# Patient Record
Sex: Female | Born: 1935
Health system: Southern US, Community
[De-identification: ages and names within clinical notes are randomized; demographics above are authoritative.]

## PROBLEM LIST (undated history)

## (undated) DIAGNOSIS — J069 Acute upper respiratory infection, unspecified: Secondary | ICD-10-CM

## (undated) DIAGNOSIS — J189 Pneumonia, unspecified organism: Secondary | ICD-10-CM

## (undated) DIAGNOSIS — C801 Malignant (primary) neoplasm, unspecified: Secondary | ICD-10-CM

## (undated) DIAGNOSIS — I10 Essential (primary) hypertension: Secondary | ICD-10-CM

## (undated) DIAGNOSIS — Z8489 Family history of other specified conditions: Secondary | ICD-10-CM

## (undated) DIAGNOSIS — R112 Nausea with vomiting, unspecified: Secondary | ICD-10-CM

## (undated) DIAGNOSIS — R011 Cardiac murmur, unspecified: Secondary | ICD-10-CM

## (undated) DIAGNOSIS — K219 Gastro-esophageal reflux disease without esophagitis: Secondary | ICD-10-CM

## (undated) DIAGNOSIS — M1990 Unspecified osteoarthritis, unspecified site: Secondary | ICD-10-CM

## (undated) DIAGNOSIS — C3490 Malignant neoplasm of unspecified part of unspecified bronchus or lung: Secondary | ICD-10-CM

## (undated) DIAGNOSIS — Z9889 Other specified postprocedural states: Secondary | ICD-10-CM

## (undated) DIAGNOSIS — I739 Peripheral vascular disease, unspecified: Secondary | ICD-10-CM

## (undated) DIAGNOSIS — R0600 Dyspnea, unspecified: Secondary | ICD-10-CM

## (undated) HISTORY — PX: DILATION AND CURETTAGE OF UTERUS: SHX78

## (undated) HISTORY — PX: MASTECTOMY: SHX3

## (undated) HISTORY — PX: LOBECTOMY: SHX5089

## (undated) HISTORY — PX: EYE SURGERY: SHX253

## (undated) HISTORY — PX: OTHER SURGICAL HISTORY: SHX169

## (undated) HISTORY — PX: BREAST BIOPSY: SHX20

## (undated) HISTORY — PX: ROTATOR CUFF REPAIR: SHX139

---

## 1999-09-15 ENCOUNTER — Encounter: Admission: RE | Admit: 1999-09-15 | Discharge: 1999-09-15 | Payer: Self-pay | Admitting: General Surgery

## 1999-09-15 ENCOUNTER — Encounter: Payer: Self-pay | Admitting: General Surgery

## 2000-09-17 ENCOUNTER — Encounter: Admission: RE | Admit: 2000-09-17 | Discharge: 2000-09-17 | Payer: Self-pay | Admitting: General Surgery

## 2000-09-17 ENCOUNTER — Encounter: Payer: Self-pay | Admitting: General Surgery

## 2001-03-31 ENCOUNTER — Emergency Department (HOSPITAL_COMMUNITY): Admission: EM | Admit: 2001-03-31 | Discharge: 2001-03-31 | Payer: Self-pay | Admitting: Emergency Medicine

## 2001-09-19 ENCOUNTER — Encounter: Payer: Self-pay | Admitting: Obstetrics and Gynecology

## 2001-09-19 ENCOUNTER — Encounter: Admission: RE | Admit: 2001-09-19 | Discharge: 2001-09-19 | Payer: Self-pay | Admitting: Obstetrics and Gynecology

## 2002-03-31 ENCOUNTER — Encounter: Payer: Self-pay | Admitting: General Surgery

## 2002-03-31 ENCOUNTER — Encounter: Admission: RE | Admit: 2002-03-31 | Discharge: 2002-03-31 | Payer: Self-pay | Admitting: General Surgery

## 2002-06-04 ENCOUNTER — Encounter: Admission: RE | Admit: 2002-06-04 | Discharge: 2002-06-04 | Payer: Self-pay | Admitting: Internal Medicine

## 2002-06-04 ENCOUNTER — Encounter: Payer: Self-pay | Admitting: Internal Medicine

## 2002-08-11 ENCOUNTER — Encounter: Payer: Self-pay | Admitting: General Surgery

## 2002-08-11 ENCOUNTER — Encounter: Admission: RE | Admit: 2002-08-11 | Discharge: 2002-08-11 | Payer: Self-pay | Admitting: General Surgery

## 2002-09-11 ENCOUNTER — Ambulatory Visit (HOSPITAL_COMMUNITY): Admission: RE | Admit: 2002-09-11 | Discharge: 2002-09-11 | Payer: Self-pay | Admitting: Gastroenterology

## 2002-10-08 ENCOUNTER — Encounter: Admission: RE | Admit: 2002-10-08 | Discharge: 2002-10-08 | Payer: Self-pay | Admitting: General Surgery

## 2002-10-08 ENCOUNTER — Encounter: Payer: Self-pay | Admitting: General Surgery

## 2002-10-27 ENCOUNTER — Ambulatory Visit (HOSPITAL_COMMUNITY): Admission: RE | Admit: 2002-10-27 | Discharge: 2002-10-27 | Payer: Self-pay | Admitting: General Surgery

## 2002-10-27 ENCOUNTER — Encounter: Payer: Self-pay | Admitting: General Surgery

## 2002-12-03 ENCOUNTER — Encounter: Admission: RE | Admit: 2002-12-03 | Discharge: 2002-12-03 | Payer: Self-pay | Admitting: General Surgery

## 2002-12-03 ENCOUNTER — Encounter: Payer: Self-pay | Admitting: General Surgery

## 2003-01-26 ENCOUNTER — Encounter: Admission: RE | Admit: 2003-01-26 | Discharge: 2003-01-26 | Payer: Self-pay | Admitting: Obstetrics and Gynecology

## 2003-01-26 ENCOUNTER — Encounter: Payer: Self-pay | Admitting: Obstetrics and Gynecology

## 2003-02-04 ENCOUNTER — Ambulatory Visit (HOSPITAL_COMMUNITY): Admission: RE | Admit: 2003-02-04 | Discharge: 2003-02-04 | Payer: Self-pay | Admitting: General Surgery

## 2003-02-04 ENCOUNTER — Encounter: Payer: Self-pay | Admitting: General Surgery

## 2003-02-13 ENCOUNTER — Encounter: Payer: Self-pay | Admitting: General Surgery

## 2003-02-13 ENCOUNTER — Encounter: Admission: RE | Admit: 2003-02-13 | Discharge: 2003-02-13 | Payer: Self-pay | Admitting: General Surgery

## 2003-02-25 ENCOUNTER — Encounter: Payer: Self-pay | Admitting: Thoracic Surgery

## 2003-02-25 ENCOUNTER — Inpatient Hospital Stay (HOSPITAL_COMMUNITY): Admission: RE | Admit: 2003-02-25 | Discharge: 2003-03-07 | Payer: Self-pay | Admitting: Thoracic Surgery

## 2003-02-25 ENCOUNTER — Encounter (INDEPENDENT_AMBULATORY_CARE_PROVIDER_SITE_OTHER): Payer: Self-pay | Admitting: *Deleted

## 2003-02-26 ENCOUNTER — Encounter: Payer: Self-pay | Admitting: Thoracic Surgery

## 2003-02-27 ENCOUNTER — Encounter: Payer: Self-pay | Admitting: Thoracic Surgery

## 2003-02-28 ENCOUNTER — Encounter: Payer: Self-pay | Admitting: Thoracic Surgery

## 2003-03-01 ENCOUNTER — Encounter: Payer: Self-pay | Admitting: Thoracic Surgery

## 2003-03-02 ENCOUNTER — Encounter: Payer: Self-pay | Admitting: Thoracic Surgery

## 2003-03-03 ENCOUNTER — Encounter: Payer: Self-pay | Admitting: Thoracic Surgery

## 2003-03-04 ENCOUNTER — Encounter: Payer: Self-pay | Admitting: Thoracic Surgery

## 2003-03-05 ENCOUNTER — Encounter: Payer: Self-pay | Admitting: Thoracic Surgery

## 2003-03-06 ENCOUNTER — Encounter: Payer: Self-pay | Admitting: Thoracic Surgery

## 2003-03-07 ENCOUNTER — Encounter: Payer: Self-pay | Admitting: Thoracic Surgery

## 2003-03-13 ENCOUNTER — Encounter: Admission: RE | Admit: 2003-03-13 | Discharge: 2003-03-13 | Payer: Self-pay | Admitting: Thoracic Surgery

## 2003-03-13 ENCOUNTER — Encounter: Payer: Self-pay | Admitting: Thoracic Surgery

## 2003-03-31 ENCOUNTER — Encounter: Payer: Self-pay | Admitting: Thoracic Surgery

## 2003-03-31 ENCOUNTER — Encounter: Admission: RE | Admit: 2003-03-31 | Discharge: 2003-03-31 | Payer: Self-pay | Admitting: Thoracic Surgery

## 2003-04-13 ENCOUNTER — Encounter: Payer: Self-pay | Admitting: Thoracic Surgery

## 2003-04-13 ENCOUNTER — Ambulatory Visit (HOSPITAL_COMMUNITY): Admission: RE | Admit: 2003-04-13 | Discharge: 2003-04-13 | Payer: Self-pay | Admitting: Thoracic Surgery

## 2003-05-29 ENCOUNTER — Encounter: Admission: RE | Admit: 2003-05-29 | Discharge: 2003-05-29 | Payer: Self-pay | Admitting: Thoracic Surgery

## 2003-05-29 ENCOUNTER — Encounter: Payer: Self-pay | Admitting: Thoracic Surgery

## 2003-06-30 ENCOUNTER — Encounter: Payer: Self-pay | Admitting: Oncology

## 2003-06-30 ENCOUNTER — Encounter: Admission: RE | Admit: 2003-06-30 | Discharge: 2003-06-30 | Payer: Self-pay | Admitting: Oncology

## 2003-07-28 ENCOUNTER — Encounter: Payer: Self-pay | Admitting: Oncology

## 2003-07-28 ENCOUNTER — Ambulatory Visit (HOSPITAL_COMMUNITY): Admission: RE | Admit: 2003-07-28 | Discharge: 2003-07-28 | Payer: Self-pay | Admitting: Oncology

## 2003-08-27 ENCOUNTER — Encounter: Admission: RE | Admit: 2003-08-27 | Discharge: 2003-08-27 | Payer: Self-pay | Admitting: Thoracic Surgery

## 2003-08-27 ENCOUNTER — Encounter: Payer: Self-pay | Admitting: Thoracic Surgery

## 2003-09-28 ENCOUNTER — Encounter: Admission: RE | Admit: 2003-09-28 | Discharge: 2003-09-28 | Payer: Self-pay | Admitting: Oncology

## 2003-11-25 ENCOUNTER — Encounter: Admission: RE | Admit: 2003-11-25 | Discharge: 2003-11-25 | Payer: Self-pay | Admitting: Thoracic Surgery

## 2003-12-04 ENCOUNTER — Encounter: Admission: RE | Admit: 2003-12-04 | Discharge: 2003-12-04 | Payer: Self-pay | Admitting: Oncology

## 2004-01-12 ENCOUNTER — Ambulatory Visit (HOSPITAL_COMMUNITY): Admission: RE | Admit: 2004-01-12 | Discharge: 2004-01-12 | Payer: Self-pay | Admitting: Gastroenterology

## 2004-01-12 ENCOUNTER — Encounter (INDEPENDENT_AMBULATORY_CARE_PROVIDER_SITE_OTHER): Payer: Self-pay | Admitting: *Deleted

## 2004-02-24 ENCOUNTER — Encounter: Admission: RE | Admit: 2004-02-24 | Discharge: 2004-02-24 | Payer: Self-pay | Admitting: Thoracic Surgery

## 2004-05-30 ENCOUNTER — Ambulatory Visit (HOSPITAL_COMMUNITY): Admission: RE | Admit: 2004-05-30 | Discharge: 2004-05-30 | Payer: Self-pay | Admitting: Oncology

## 2004-09-06 ENCOUNTER — Ambulatory Visit (HOSPITAL_COMMUNITY): Admission: RE | Admit: 2004-09-06 | Discharge: 2004-09-06 | Payer: Self-pay | Admitting: Gastroenterology

## 2004-10-18 ENCOUNTER — Ambulatory Visit (HOSPITAL_COMMUNITY): Admission: RE | Admit: 2004-10-18 | Discharge: 2004-10-18 | Payer: Self-pay | Admitting: Oncology

## 2004-10-18 ENCOUNTER — Ambulatory Visit: Payer: Self-pay | Admitting: Oncology

## 2004-10-25 ENCOUNTER — Encounter: Admission: RE | Admit: 2004-10-25 | Discharge: 2004-10-25 | Payer: Self-pay | Admitting: Oncology

## 2004-11-23 ENCOUNTER — Encounter: Admission: RE | Admit: 2004-11-23 | Discharge: 2004-11-23 | Payer: Self-pay | Admitting: Oncology

## 2004-11-30 ENCOUNTER — Encounter: Admission: RE | Admit: 2004-11-30 | Discharge: 2004-11-30 | Payer: Self-pay | Admitting: Thoracic Surgery

## 2004-12-08 ENCOUNTER — Encounter: Admission: RE | Admit: 2004-12-08 | Discharge: 2004-12-08 | Payer: Self-pay | Admitting: Oncology

## 2004-12-13 ENCOUNTER — Encounter: Admission: RE | Admit: 2004-12-13 | Discharge: 2004-12-13 | Payer: Self-pay | Admitting: Oncology

## 2004-12-26 ENCOUNTER — Ambulatory Visit (HOSPITAL_BASED_OUTPATIENT_CLINIC_OR_DEPARTMENT_OTHER): Admission: RE | Admit: 2004-12-26 | Discharge: 2004-12-26 | Payer: Self-pay | Admitting: General Surgery

## 2004-12-26 ENCOUNTER — Encounter (INDEPENDENT_AMBULATORY_CARE_PROVIDER_SITE_OTHER): Payer: Self-pay | Admitting: *Deleted

## 2004-12-26 ENCOUNTER — Ambulatory Visit (HOSPITAL_COMMUNITY): Admission: RE | Admit: 2004-12-26 | Discharge: 2004-12-26 | Payer: Self-pay | Admitting: General Surgery

## 2005-03-08 ENCOUNTER — Ambulatory Visit: Payer: Self-pay | Admitting: Oncology

## 2005-03-10 ENCOUNTER — Ambulatory Visit (HOSPITAL_COMMUNITY): Admission: RE | Admit: 2005-03-10 | Discharge: 2005-03-10 | Payer: Self-pay | Admitting: Oncology

## 2005-03-30 ENCOUNTER — Ambulatory Visit (HOSPITAL_COMMUNITY): Admission: RE | Admit: 2005-03-30 | Discharge: 2005-03-30 | Payer: Self-pay | Admitting: Oncology

## 2005-06-13 ENCOUNTER — Encounter: Admission: RE | Admit: 2005-06-13 | Discharge: 2005-06-13 | Payer: Self-pay | Admitting: Thoracic Surgery

## 2005-07-25 ENCOUNTER — Ambulatory Visit (HOSPITAL_COMMUNITY): Admission: RE | Admit: 2005-07-25 | Discharge: 2005-07-25 | Payer: Self-pay | Admitting: Ophthalmology

## 2005-08-21 ENCOUNTER — Ambulatory Visit: Payer: Self-pay | Admitting: Oncology

## 2005-09-12 ENCOUNTER — Ambulatory Visit (HOSPITAL_COMMUNITY): Admission: RE | Admit: 2005-09-12 | Discharge: 2005-09-12 | Payer: Self-pay | Admitting: Oncology

## 2005-12-07 ENCOUNTER — Encounter: Admission: RE | Admit: 2005-12-07 | Discharge: 2005-12-07 | Payer: Self-pay | Admitting: Oncology

## 2005-12-20 ENCOUNTER — Encounter: Admission: RE | Admit: 2005-12-20 | Discharge: 2005-12-20 | Payer: Self-pay | Admitting: Thoracic Surgery

## 2006-03-16 ENCOUNTER — Ambulatory Visit: Payer: Self-pay | Admitting: Oncology

## 2006-03-16 LAB — COMPREHENSIVE METABOLIC PANEL
ALT: 19 U/L (ref 0–40)
AST: 23 U/L (ref 0–37)
BUN: 15 mg/dL (ref 6–23)
CO2: 27 mEq/L (ref 19–32)
Creatinine, Ser: 0.9 mg/dL (ref 0.4–1.2)
Total Bilirubin: 0.7 mg/dL (ref 0.3–1.2)

## 2006-03-16 LAB — CBC WITH DIFFERENTIAL/PLATELET
BASO%: 0.6 % (ref 0.0–2.0)
EOS%: 1.9 % (ref 0.0–7.0)
HCT: 40.8 % (ref 34.8–46.6)
LYMPH%: 39.9 % (ref 14.0–48.0)
MCH: 32.7 pg (ref 26.0–34.0)
MCHC: 34.1 g/dL (ref 32.0–36.0)
NEUT%: 50.9 % (ref 39.6–76.8)
Platelets: 230 10*3/uL (ref 145–400)

## 2006-03-16 LAB — CEA: CEA: 0.8 ng/mL (ref 0.0–5.0)

## 2006-03-19 ENCOUNTER — Ambulatory Visit (HOSPITAL_COMMUNITY): Admission: RE | Admit: 2006-03-19 | Discharge: 2006-03-19 | Payer: Self-pay | Admitting: Oncology

## 2006-06-19 ENCOUNTER — Encounter: Admission: RE | Admit: 2006-06-19 | Discharge: 2006-06-19 | Payer: Self-pay | Admitting: Thoracic Surgery

## 2006-09-14 ENCOUNTER — Ambulatory Visit: Payer: Self-pay | Admitting: Oncology

## 2006-09-17 ENCOUNTER — Encounter: Admission: RE | Admit: 2006-09-17 | Discharge: 2006-09-17 | Payer: Self-pay | Admitting: Oncology

## 2006-12-18 ENCOUNTER — Encounter: Admission: RE | Admit: 2006-12-18 | Discharge: 2006-12-18 | Payer: Self-pay | Admitting: Thoracic Surgery

## 2006-12-20 ENCOUNTER — Encounter: Admission: RE | Admit: 2006-12-20 | Discharge: 2006-12-20 | Payer: Self-pay | Admitting: Obstetrics and Gynecology

## 2007-01-03 ENCOUNTER — Ambulatory Visit (HOSPITAL_COMMUNITY): Admission: RE | Admit: 2007-01-03 | Discharge: 2007-01-03 | Payer: Self-pay | Admitting: Internal Medicine

## 2007-03-13 ENCOUNTER — Ambulatory Visit: Payer: Self-pay | Admitting: Oncology

## 2007-03-18 LAB — CBC WITH DIFFERENTIAL/PLATELET
Basophils Absolute: 0.1 10*3/uL (ref 0.0–0.1)
EOS%: 2.2 % (ref 0.0–7.0)
Eosinophils Absolute: 0.2 10*3/uL (ref 0.0–0.5)
HCT: 40.4 % (ref 34.8–46.6)
HGB: 14.2 g/dL (ref 11.6–15.9)
MCH: 33.7 pg (ref 26.0–34.0)
MONO#: 0.6 10*3/uL (ref 0.1–0.9)
NEUT#: 4 10*3/uL (ref 1.5–6.5)
NEUT%: 42.9 % (ref 39.6–76.8)
lymph#: 4.4 10*3/uL — ABNORMAL HIGH (ref 0.9–3.3)

## 2007-03-18 LAB — COMPREHENSIVE METABOLIC PANEL
Albumin: 4.1 g/dL (ref 3.5–5.2)
BUN: 14 mg/dL (ref 6–23)
CO2: 30 mEq/L (ref 19–32)
Calcium: 9.6 mg/dL (ref 8.4–10.5)
Chloride: 103 mEq/L (ref 96–112)
Creatinine, Ser: 0.86 mg/dL (ref 0.40–1.20)
Glucose, Bld: 103 mg/dL — ABNORMAL HIGH (ref 70–99)
Potassium: 4.2 mEq/L (ref 3.5–5.3)

## 2007-03-18 LAB — CEA: CEA: 0.5 ng/mL (ref 0.0–5.0)

## 2007-03-20 ENCOUNTER — Ambulatory Visit (HOSPITAL_COMMUNITY): Admission: RE | Admit: 2007-03-20 | Discharge: 2007-03-20 | Payer: Self-pay | Admitting: Oncology

## 2007-06-18 ENCOUNTER — Ambulatory Visit: Payer: Self-pay | Admitting: Thoracic Surgery

## 2007-06-18 ENCOUNTER — Encounter: Admission: RE | Admit: 2007-06-18 | Discharge: 2007-06-18 | Payer: Self-pay | Admitting: Thoracic Surgery

## 2007-09-12 ENCOUNTER — Ambulatory Visit: Payer: Self-pay | Admitting: Oncology

## 2007-09-16 ENCOUNTER — Ambulatory Visit (HOSPITAL_COMMUNITY): Admission: RE | Admit: 2007-09-16 | Discharge: 2007-09-16 | Payer: Self-pay | Admitting: Oncology

## 2007-09-16 LAB — COMPREHENSIVE METABOLIC PANEL
ALT: 17 U/L (ref 0–35)
Albumin: 4 g/dL (ref 3.5–5.2)
Alkaline Phosphatase: 50 U/L (ref 39–117)
CO2: 29 mEq/L (ref 19–32)
Glucose, Bld: 124 mg/dL — ABNORMAL HIGH (ref 70–99)
Potassium: 3.9 mEq/L (ref 3.5–5.3)
Sodium: 140 mEq/L (ref 135–145)
Total Bilirubin: 0.8 mg/dL (ref 0.3–1.2)
Total Protein: 6.3 g/dL (ref 6.0–8.3)

## 2007-09-16 LAB — CBC WITH DIFFERENTIAL/PLATELET
Basophils Absolute: 0.1 10*3/uL (ref 0.0–0.1)
Eosinophils Absolute: 0.7 10*3/uL — ABNORMAL HIGH (ref 0.0–0.5)
HGB: 13.6 g/dL (ref 11.6–15.9)
MONO%: 6.3 % (ref 0.0–13.0)
NEUT#: 4.5 10*3/uL (ref 1.5–6.5)
RBC: 4.05 10*6/uL (ref 3.70–5.32)
RDW: 13 % (ref 11.3–14.5)
WBC: 9.8 10*3/uL (ref 3.9–10.0)
lymph#: 3.8 10*3/uL — ABNORMAL HIGH (ref 0.9–3.3)

## 2007-09-16 LAB — CEA: CEA: 1 ng/mL (ref 0.0–5.0)

## 2007-12-17 ENCOUNTER — Ambulatory Visit: Payer: Self-pay | Admitting: Thoracic Surgery

## 2007-12-17 ENCOUNTER — Encounter: Admission: RE | Admit: 2007-12-17 | Discharge: 2007-12-17 | Payer: Self-pay | Admitting: Thoracic Surgery

## 2007-12-24 ENCOUNTER — Encounter: Admission: RE | Admit: 2007-12-24 | Discharge: 2007-12-24 | Payer: Self-pay | Admitting: Oncology

## 2007-12-27 ENCOUNTER — Encounter: Admission: RE | Admit: 2007-12-27 | Discharge: 2007-12-27 | Payer: Self-pay | Admitting: Oncology

## 2008-01-06 ENCOUNTER — Encounter: Admission: RE | Admit: 2008-01-06 | Discharge: 2008-01-06 | Payer: Self-pay | Admitting: Oncology

## 2008-04-08 ENCOUNTER — Ambulatory Visit: Payer: Self-pay | Admitting: Oncology

## 2008-04-14 ENCOUNTER — Ambulatory Visit (HOSPITAL_COMMUNITY): Admission: RE | Admit: 2008-04-14 | Discharge: 2008-04-14 | Payer: Self-pay | Admitting: Oncology

## 2008-05-13 ENCOUNTER — Ambulatory Visit: Payer: Self-pay | Admitting: Thoracic Surgery

## 2008-06-03 ENCOUNTER — Ambulatory Visit: Payer: Self-pay | Admitting: Vascular Surgery

## 2008-09-11 ENCOUNTER — Ambulatory Visit: Payer: Self-pay | Admitting: Vascular Surgery

## 2009-01-06 ENCOUNTER — Encounter: Admission: RE | Admit: 2009-01-06 | Discharge: 2009-01-06 | Payer: Self-pay | Admitting: Oncology

## 2009-03-26 ENCOUNTER — Ambulatory Visit: Payer: Self-pay | Admitting: Oncology

## 2009-03-30 ENCOUNTER — Ambulatory Visit (HOSPITAL_COMMUNITY): Admission: RE | Admit: 2009-03-30 | Discharge: 2009-03-30 | Payer: Self-pay | Admitting: Oncology

## 2009-03-30 LAB — COMPREHENSIVE METABOLIC PANEL
ALT: 15 U/L (ref 0–35)
AST: 17 U/L (ref 0–37)
Albumin: 4.4 g/dL (ref 3.5–5.2)
Alkaline Phosphatase: 62 U/L (ref 39–117)
BUN: 21 mg/dL (ref 6–23)
CO2: 27 mEq/L (ref 19–32)
Calcium: 9.4 mg/dL (ref 8.4–10.5)
Chloride: 101 mEq/L (ref 96–112)
Creatinine, Ser: 0.88 mg/dL (ref 0.40–1.20)
Glucose, Bld: 107 mg/dL — ABNORMAL HIGH (ref 70–99)
Potassium: 3.8 mEq/L (ref 3.5–5.3)
Sodium: 139 mEq/L (ref 135–145)
Total Bilirubin: 0.7 mg/dL (ref 0.3–1.2)
Total Protein: 6.5 g/dL (ref 6.0–8.3)

## 2009-03-30 LAB — CBC WITH DIFFERENTIAL/PLATELET
BASO%: 0.7 % (ref 0.0–2.0)
Basophils Absolute: 0.1 10*3/uL (ref 0.0–0.1)
EOS%: 3 % (ref 0.0–7.0)
Eosinophils Absolute: 0.3 10*3/uL (ref 0.0–0.5)
HCT: 40.5 % (ref 34.8–46.6)
HGB: 14.2 g/dL (ref 11.6–15.9)
LYMPH%: 41.1 % (ref 14.0–49.7)
MCH: 34.2 pg — ABNORMAL HIGH (ref 25.1–34.0)
MCHC: 35.1 g/dL (ref 31.5–36.0)
MCV: 97.4 fL (ref 79.5–101.0)
MONO#: 0.6 10*3/uL (ref 0.1–0.9)
MONO%: 6.1 % (ref 0.0–14.0)
NEUT#: 4.8 10*3/uL (ref 1.5–6.5)
NEUT%: 49.1 % (ref 38.4–76.8)
Platelets: 249 10*3/uL (ref 145–400)
RBC: 4.16 10*6/uL (ref 3.70–5.45)
RDW: 13.1 % (ref 11.2–14.5)
WBC: 9.8 10*3/uL (ref 3.9–10.3)
lymph#: 4 10*3/uL — ABNORMAL HIGH (ref 0.9–3.3)

## 2009-08-12 ENCOUNTER — Encounter: Admission: RE | Admit: 2009-08-12 | Discharge: 2009-08-12 | Payer: Self-pay | Admitting: Obstetrics and Gynecology

## 2010-01-07 ENCOUNTER — Encounter: Admission: RE | Admit: 2010-01-07 | Discharge: 2010-01-07 | Payer: Self-pay | Admitting: Oncology

## 2010-03-31 ENCOUNTER — Ambulatory Visit: Payer: Self-pay | Admitting: Oncology

## 2010-03-31 ENCOUNTER — Ambulatory Visit (HOSPITAL_COMMUNITY): Admission: RE | Admit: 2010-03-31 | Discharge: 2010-03-31 | Payer: Self-pay | Admitting: Oncology

## 2010-03-31 LAB — CBC WITH DIFFERENTIAL/PLATELET
BASO%: 1.1 % (ref 0.0–2.0)
Eosinophils Absolute: 0.2 10*3/uL (ref 0.0–0.5)
HCT: 41 % (ref 34.8–46.6)
MCHC: 34.9 g/dL (ref 31.5–36.0)
MONO#: 0.7 10*3/uL (ref 0.1–0.9)
NEUT#: 5.5 10*3/uL (ref 1.5–6.5)
NEUT%: 54.6 % (ref 38.4–76.8)
RBC: 4.24 10*6/uL (ref 3.70–5.45)
WBC: 10.1 10*3/uL (ref 3.9–10.3)
lymph#: 3.6 10*3/uL — ABNORMAL HIGH (ref 0.9–3.3)

## 2010-03-31 LAB — COMPREHENSIVE METABOLIC PANEL
ALT: 15 U/L (ref 0–35)
CO2: 26 mEq/L (ref 19–32)
Calcium: 9.7 mg/dL (ref 8.4–10.5)
Chloride: 99 mEq/L (ref 96–112)
Glucose, Bld: 92 mg/dL (ref 70–99)
Sodium: 138 mEq/L (ref 135–145)
Total Protein: 6.7 g/dL (ref 6.0–8.3)

## 2010-11-01 ENCOUNTER — Ambulatory Visit
Admission: RE | Admit: 2010-11-01 | Discharge: 2010-11-02 | Payer: Self-pay | Source: Home / Self Care | Attending: Orthopedic Surgery | Admitting: Orthopedic Surgery

## 2010-12-10 ENCOUNTER — Encounter: Payer: Self-pay | Admitting: Thoracic Surgery

## 2010-12-10 ENCOUNTER — Encounter: Payer: Self-pay | Admitting: Gastroenterology

## 2010-12-10 ENCOUNTER — Encounter: Payer: Self-pay | Admitting: Oncology

## 2010-12-11 ENCOUNTER — Encounter: Payer: Self-pay | Admitting: Oncology

## 2010-12-11 ENCOUNTER — Encounter: Payer: Self-pay | Admitting: Thoracic Surgery

## 2011-01-27 ENCOUNTER — Other Ambulatory Visit: Payer: Self-pay | Admitting: Internal Medicine

## 2011-01-27 DIAGNOSIS — Z9010 Acquired absence of unspecified breast and nipple: Secondary | ICD-10-CM

## 2011-01-30 LAB — BASIC METABOLIC PANEL
Calcium: 9.8 mg/dL (ref 8.4–10.5)
Creatinine, Ser: 0.68 mg/dL (ref 0.4–1.2)
GFR calc Af Amer: >60 mL/min (ref >60)
GFR calc non Af Amer: >60 mL/min (ref >60)

## 2011-01-30 LAB — POCT HEMOGLOBIN-HEMACUE: Hemoglobin: 15.4 g/dL — ABNORMAL HIGH (ref 12.0–15.0)

## 2011-01-30 LAB — URIC ACID: Uric Acid, Serum: 4 mg/dL (ref 2.4–7.0)

## 2011-02-07 ENCOUNTER — Ambulatory Visit
Admission: RE | Admit: 2011-02-07 | Discharge: 2011-02-07 | Disposition: A | Discharge status: Home / Self Care | Payer: Medicare Other | Source: Ambulatory Visit | Attending: Internal Medicine | Admitting: Internal Medicine

## 2011-02-07 DIAGNOSIS — Z9010 Acquired absence of unspecified breast and nipple: Secondary | ICD-10-CM

## 2011-04-04 NOTE — Letter (Signed)
June 18, 2007   Valentino Hue. Magrinat, M.D.  [address] [facility]  [place], [place] [address]   Re:  RANIE, CHINCHILLA             DOB:  [birth year]   Dear Marikay Alar,   I saw Ms. Frontera back in the office today.  She is over 4 years since  her surgery, with no evidence of recurrence.  Her chest x-ray today ws  clear.  Her CT scan in April was also negative.  Her blood pressure is  116/73, pulse 74, respirations 18, saturations were 99%.  I will see her  back again in 6 months with another chest x-ray, and we will repeat her  CT scan next April, which will be 5 years.   Ines Bloomer, M.D.  Electronically Signed   DPB/MEDQ  D:  06/18/2007  T:  06/19/2007  Job:  [id number]

## 2011-04-04 NOTE — Assessment & Plan Note (Signed)
OFFICE VISIT   Karen Kaiser, Karen Kaiser  DOB:  05/14/1935                                        May 13, 2008  CHART #:  [id number]   HISTORY:  The patient is seen in followup on today's date.  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  This was done by  wedge resection.  Most recently, she has undergone repeat bone scan.  Additionally, she has had a repeat CT scan of the head as well as the  chest.  There are no new findings to evidence carcinoma or metastatic  findings.   PHYSICAL EXAMINATION:  GENERAL:  Alert, white female who appears well,  in no acute distress.  VITAL SIGNS:  Blood pressure 128/73, pulse 66, respirations 18, and  oxygen saturation 96%.  PULMONARY:  Clear breath sounds.  CARDIAC:  Regular rate and rhythm.  Right chest incision is well healed.   ASSESSMENT:  The patient is continuing to do well with no evidence of  recurrence of her lung cancer.  She will be continued to be followed by  Dr. Darnelle Catalan.  Dr. Edwyna Shell feels at this time we do not need to continue  to see her in the office.  We would, of course, be happy to see her for  any new thoracic surgical issues.   Rowe Clack, P.A.-C.   Sherryll Burger  D:  05/13/2008  T:  05/13/2008  Job:  [id number]   cc:   Ines Bloomer, M.D.  Valentino Hue. Magrinat, M.D.

## 2011-04-04 NOTE — Letter (Signed)
June 03, 2008   Theressa Millard, M.D.  [address] [place]  [place] [address]   Re:  Karen Kaiser, Karen Kaiser             DOB:  [birth year]   Dear Rosanne Ashing:   Thank you for asking me to see your patient for evaluation of bilateral  venous pathology right greater than left.  As you know she is a very  pleasant 76 year old white female with a long history of venous  pathology.  She is a retired Designer, jewellery.  She had had spider vein  injections many years ago on her right ankle with some moderate success.  She does have a history of hypertension, osteoporosis, glaucoma.  She  had breast cancer treatment in 1996 and lung cancer treatment in 2004.  She does have a history of gastroesophageal reflux disease.  She does  not have any history of deep venous thrombosis or superficial  thrombophlebitis.  Her symptoms are mainly limited in her right leg  where she has a heavy sensation with aching and swelling with prolonged  standing.  She reports quite significant swelling around her foot and  ankle at the end of the day.  She does wear support hose but has not  worn graduated compression garments.   CURRENT MEDICATIONS:  Protonix, Lotrel, Tenoretic, timolol eye drops.   PHYSICAL EXAMINATION:  General:  This is a well-developed, well-  nourished white female appearing stated age of 8.  Vital signs:  Blood  pressure is 144/86, pulse 67, respirations 18.  Her radial and dorsalis  pedis pulses are 2+ bilaterally.  She does have marked spider vein  telangiectasia in both legs from her proximal thighs down to her ankles.  She does have significant varicosities over her right anterior thigh  extending to her lateral knee and her lateral calf.  She does report  discomfort related to these and also reports a tired heavy sensation  below.  She does not have any open venous ulcers.  She underwent a  handheld duplex screen by me.  This did show aberrant vein off the  saphenofemoral junction coursing  over her anterior thigh and supplying  these lateral varices.   I discussed options with the patient.  I have recommended that we begin  her in graduated compression garments to see if she will have acceptable  relief with this.  She was instructed on their use and has been fitted  with thigh high 20-30 mmHg compression garments.  We will see her again  in 3 months to determine if she is having adequate treatment with  compression garments.  She will also elevate her legs when possible  which she is still doing and also recommended Advil 600 mg q.6 hours  p.r.n. for symptom relief as well.  We will see her again in 3 months  for further discussion.   Sincerely,   Karen Kaiser, M.D.  Electronically Signed   TFE/MEDQ  D:  06/03/2008  T:  06/04/2008  Job:  [id number]

## 2011-04-04 NOTE — Procedures (Signed)
LOWER EXTREMITY VENOUS REFLUX EXAM   INDICATION:  Right lower extremity varicose veins.   EXAM:  Using color-flow imaging and pulse Doppler spectral analysis, the  right common femoral, superficial femoral, popliteal, posterior tibial,  greater and lesser saphenous veins are evaluated.  There is no evidence  suggesting deep venous insufficiency in the right lower extremity.   The right saphenofemoral junction is competent.  The right GSV is  competent.  The right lateral accessory saphenous vein is not competent.   The right proximal short saphenous vein demonstrates competency.   GSV Diameter (used if found to be incompetent only)                                            Right    Left  Proximal Greater Saphenous Vein           cm       cm  Proximal-to-mid-thigh                     cm       cm  Mid thigh                                 cm       cm  Mid-distal thigh                          cm       cm  Distal thigh                              cm       cm  Knee                                      cm       cm   IMPRESSION:  1. No reflux is noted in the right greater saphenous vein.  2. Reflux is identified in the proximal to mid right lateral accessory      saphenous vein with diameter measurements noted as described on the      attached work sheet.  3. The right greater saphenous vein is not aneurysmal.  4. The right greater saphenous vein is not tortuous.  5. The deep venous system is competent.  6. The right lesser saphenous vein is competent.       ___________________________________________  Larina Earthly, M.D.   CH/MEDQ  D:  09/14/2008  T:  09/14/2008  Job:  [id number]

## 2011-04-04 NOTE — Assessment & Plan Note (Signed)
OFFICE VISIT   Karen Kaiser, Karen Kaiser  DOB:  November 14, 1935                                       09/11/2008  JYNWG#:[id number]   The patient presents today for continued followup of her right leg  venous varicosities.  She reports that since my last visit with her in  July she has developed significant arthritic discomfort in her right  knee.  This has curtailed her walking ability.  This has been much more  of a concern to her than her discomfort from her varicosities.  She has  been very compliant with her thigh-high graduated compression garments  and reports that this has given her relief of a portion of the symptoms  she was having from the varicosities.  She has not had any new  difficulty with bleeding or deep venous thrombosis.   PHYSICAL EXAM:  A well-developed well-nourished white female appearing  stated age of 6.  Blood pressure is 156/86, pulse 67, respirations 18.  Her right leg is noted for multiple telangiectasia.  She does have a  varicosity extending from her groin over the anterior thigh to her  lateral right knee.  She underwent formal duplex today and this revealed  no evidence of deep venous thrombosis and no evidence of deep venous  reflux on the right.  She did have reflux at the saphenofemoral junction  but the remaining portion of her great and small saphenous vein was  without reflux.   I discussed options with the patient.  I explained that certainly since  her knee discomfort is more than what she has from the varicosities I  would not recommend any treatment at this time.  Fortunately, she has  had significant relief with the compression garments and she will  continue to wear these.  She understands the symptoms of worsening  discomfort and will notify us should this occur.  Otherwise, we will see  her again on an as needed basis.   Larina Earthly, M.D.  Electronically Signed   TFE/MEDQ  D:  09/11/2008  T:  09/14/2008  Job:   [id number]   cc:   Theressa Millard, M.D.

## 2011-04-04 NOTE — Letter (Signed)
December 17, 2007   Valentino Hue. Magrinat, M.D.  [address] [facility]  [place]  [place] [address]   Re:  NEHEMIAH, MONTEE             DOB:  [birth year]   Dear Marikay Alar,   I saw Ms. Stys in the office today.  Her blood pressure is 144/81,  pulse 72, respirations 16, saturation 97%.  Lungs were clear to  auscultation and percussion.  Chest x-ray showed no evidence of  recurrence.  We will get a full set of scans in May and I will see her  back in June.   Ines Bloomer, M.D.  Electronically Signed   DPB/MEDQ  D:  12/17/2007  T:  12/18/2007  Job:  [id number]

## 2011-04-07 NOTE — H&P (Signed)
Karen Kaiser, Karen Kaiser             ACCOUNT NO.:  [id number]   MEDICAL RECORD NO.:  [id number]          PATIENT TYPE:  AMB   LOCATION:  SDS                          FACILITY:  [facility]   PHYSICIAN:  Guadelupe Sabin, M.D.DATE OF BIRTH:  November 20, 1935   DATE OF ADMISSION:  07/25/2005  DATE OF DISCHARGE:                                HISTORY & PHYSICAL   OUTPATIENT ADMISSION NOTE   This was a planned outpatient surgical admission of this 76 year old white  female admitted for cataract implant surgery of the right eye.   PRESENT ILLNESS:  This patient has a long history of cataract formation in  both eyes.  She previously had cataract implant surgery by Dr. Antony Contras on April 30, 2000, left eye.  The patient did well following  thissurgical procedure and her vision has recently deteriorated in the right  eye and she has elected to proceed with similar surgery.  She was given oral  discussion and printed information concerning the procedure and its possible  complications.  She signed an informed consent and arrangements were made  for her outpatient admission at this time.   PAST MEDICAL HISTORY:  The patient is in stable general health under the  care of her regular physician, Dr. Earl Gala and Dr. Darnelle Catalan.   CURRENT MEDICATIONS:  1.  Protonix.  2.  Lotrel.  3.  Glucosamine.  4.  Evista.  5.  Tenoretic.  6.  Calcium.  7.  For her chronic glaucoma the patient takes Cosopt ophthalmic solution to      both eyes, one drop twice a day.   REVIEW OF SYSTEMS:  No current cardiorespiratory complaints.   ALLERGIES:  LATEX AND XALATAN OPTHALMIC SOLUTION.   PHYSICAL EXAMINATION:  VITAL SIGNS:  As recorded on admission blood pressure  147/77, pulse 60, respirations 16, temperature 97.5.  GENERAL APPEARANCE:  The patient is a pleasant well-nourished, well-  developed white female in no acute distress.  HEENT:  Eyes: Visual acuity with best correction 20/60 right eye, 20/20 left  eye.   Applanation tonometry: 13 mm right eye, 15 left eye.  Slit lamp  examination:  The eyes are white and clear.  The cornea is clear with  pigmented dust on the corneal endothelium of both eyes.  A nuclear cataract  is present in the right eye and a posterior chamber intraocular lens implant  in the left eye.  Ocular motility and pupillary exams normal.  Dilated  fundus examination shows a clear vitreous, attached retina. The optic nerve  is sharply outlined of good color, disk-cup ratio 0.7.  The blood vessels  and macula appear normal.  CHEST/LUNGS:  Clear to percussion and auscultation.  HEART:  Normal sinus rhythm, no cardiomegaly, no murmurs.  ABDOMEN:  Negative.  EXTREMITIES:  Negative.   ADMISSION DIAGNOSES:  1.  Senile nuclear cataract, right eye; pseudophakia, left eye,  2.  Chronic pigmentary open-angle glaucoma, both eyes.   SURGICAL PLAN:  Cataract implant surgery, right eye now.           ______________________________  Guadelupe Sabin, M.D.     HNJ/MEDQ  D:  07/25/2005  T:  07/25/2005  Job:  [id number]   cc:   Theressa Millard, M.D.  [address] [place]  [place] [address]  Fax: [phone]

## 2011-04-07 NOTE — Op Note (Signed)
NAME:  TIPHANY, FAYSON                       ACCOUNT NO.:  [id number]   MEDICAL RECORD NO.:  [id number]                   PATIENT TYPE:  AMB   LOCATION:  ENDO                                 FACILITY:  [facility]   PHYSICIAN:  Petra Kuba, M.D.                 DATE OF BIRTH:  18-Sep-1935   DATE OF PROCEDURE:  09/11/2002  DATE OF DISCHARGE:                                 OPERATIVE REPORT   PROCEDURE:  Colonoscopy.   INDICATIONS FOR PROCEDURE:  A patient with abnormal CT due for colonic  screening. Consent was signed after risks, benefits, methods, and options  were thoroughly discussed in the office.   MEDICINES USED:  Demerol 60, Versed 6.   DESCRIPTION OF PROCEDURE:  Rectal inspection was pertinent for small  external hemorrhoids. Digital exam was negative. The pediatric video  adjustable colonoscope was inserted, fairly easily advanced around the colon  to the cecum. This did not require any abdominal pressure and rolling her on  her back but other than left sided diverticula, no abnormalities were seen  on insertion. The cecum was identified by the appendiceal orifice and the  ileocecal valve. In fact, the scope was inserted a short ways into the  terminal ileum which was normal. Photo documentation was obtained. The scope  was slowly withdrawn. The prep was adequate. There was some liquid stool  that required suctioning only. On slow withdrawal through the colon, the  cecum, ascending and transverse were normal. As the scope was withdrawn  around the left side of the colon, scattered diverticula were seen but no  polypoid lesions, masses, or other abnormalities were seen as we slowly  withdrew back to the rectum. Once back in the rectum, the scope was  retroflexed pertinent for some internal hemorrhoids.  The scope was  straightened and readvanced a short ways up the left side of the colon, air  was suctioned, scope removed. The patient tolerated the procedure well.  There  was no obvious or immediate complications.   ENDOSCOPIC DIAGNOSIS:  1. Small internal and external hemorrhoids.  2. Left scattered diverticula.  3. Otherwise within normal limits to the cecum and the terminal ileum.    PLAN:  Happy to see back p.r.n., otherwise, return care to Dr. Earl Gala for  the customary health care maintenance to include yearly rectals and guaiacs;  otherwise, recommend repeat colonic screening in five years.                                               Petra Kuba, M.D.    MEM/MEDQ  D:  09/11/2002  T:  09/11/2002  Job:  [id number]   cc:   Lorne Skeens. Hoxworth, M.D.  Fax: [phone]   Theressa Millard, M.D.  [address]  [place]  [place] [address]  Fax: [phone]

## 2011-04-07 NOTE — Discharge Summary (Signed)
NAME:  Karen Kaiser, Karen Kaiser                       ACCOUNT NO.:  [id number]   MEDICAL RECORD NO.:  [id number]                   PATIENT TYPE:  INP   LOCATION:  2039                                 FACILITY:  [facility]   PHYSICIAN:  Ines Bloomer, M.D.              DATE OF BIRTH:  [birth year]   DATE OF ADMISSION:  02/25/2003  DATE OF DISCHARGE:  03/07/2003                                 DISCHARGE SUMMARY   ADMISSION DIAGNOSIS:  Right lower lobe lung lesion.   PAST MEDICAL HISTORY:  1. Hypertension.  2. Breast cancer, ductal carcinoma in situ, June 1996, status post right     mastectomy.  3. Glaucoma.  4. Left cataract with intraoperative lens implant June 2001.   ALLERGIES:  1. LATEX; she has had blood testing which confirms a true LATEX allergy.  2. She is intolerant to NORVASC at high doses, 10 mg dose causes leg     swelling and facial flushing.  3. She is intolerant to XALATAN eye drops that caused joint problems.   DISCHARGE DIAGNOSIS:  Well-differentiated mucinous adenocarcinoma, T1 N0 MX.   BRIEF HISTORY:  Ms. Karen Kaiser is a 76 year old Caucasian female who was  evaluated by Dr. Edwyna Shell at the [facility] office on March 11 for a right lower  lobe lung lesion.  A CT scan obtained in May of 2003 in follow up of her  breast cancer as well as right upper quadrant abdominal pain revealed a 2 cm  lesion in the posterior segment in the right lower lobe of her lung.  Repeat  CT scan in September revealed minimal changes in this lesion.  However,  December CT scan revealed possible increased size and density of this  lesion.  Because of these findings, Dr. Edwyna Shell recommended proceeding with a  PET scan and if this was positive to proceed with surgical intervention.  The PET scan was performed on March 22 and was positive for malignant  activity in the right lung base.  Procedure risks and benefits of  thoracotomy and resection of the lesion were discussed with Karen Kaiser and  she agreed to  proceed.   HOSPITAL COURSE:  On February 25, 2003, [name] was electively admitted to  [facility] under the care of Dr. Algis Downs. [name].  She underwent  the following surgical procedures.  Right VATS, right thoracotomy, right  lower lobe wedge resection, and right lower lobectomy.  Frozen section  pathology of the wedge resection revealed bronchoalveolar cancer with close  margins.  Therefore, it was decided to proceed with the right lower  lobectomy.  Karen Kaiser tolerated the procedure well and was transferred in  stable condition to the PACU.  She remained hemodynamically stable in the  immediate postoperative period, was extubated right after surgery, and  transferred to Unit 3300 in stable condition.   POSTOPERATIVE ISSUES:  1. Karen Kaiser had a persistent small right apical pneumothorax which  resulted in her chest tube staying in a little longer than usual.  The     chest tube was placed to water seal on April 14, and the chest tube was     removed on April 16.  Post chest tube removal x-ray results were right     effusion markedly decreased, she had a 5% right pneumothorax.  This chest     x-ray was within acceptable limits.  2. Oncology consult was requested and Dr. Darnelle Catalan evaluated Karen Kaiser on     April 9.  He reviewed the final pathology with [name].  Final     pathology was returned as well-differentiated mucinous adenocarcinoma     with evidence of vesicular invasion but no positive lymph nodes and no     margins.  Dr. Darnelle Catalan felt that she has a 25% risk of recurrence.  He     felt that she could decrease this by a few percentage points to possibly     21% by taking three cycles of chemotherapy.  He did discuss the risks and     benefits of chemotherapy versus just close observation.  He felt that she     should think over her options and he planned to return to see her on     April 12.  By April 12, Karen Kaiser had decided to not proceed with      chemotherapy.  Dr. Darnelle Catalan was in agreement with this decision.  He     plans to follow her up in approximately six to eight weeks, and then he     will see her at six month intervals.  3. CT scan of the brain was performed on April 12 that was negative for any     malignancy.  4. A total body bone scan was performed on April 12 that showed a single     focus of increased activity in the left lower thoracic region at     approximately T10.  Solitary metastatic lesion is unlikely but close     follow up is recommended.   The afternoon of March 06, 2003, Ms. Fan reports feeling well.  Her  chest tube has been removed.  Her breathing is nonlabored.  Her incision is  healing well, she is tolerating her diet reasonably well, and her ambulation  is improving, her pain is well controlled.  If Karen Kaiser continues to  progress in this manner and her a.m. chest x-ray is acceptable, it is  anticipated she will be ready for discharge home tomorrow, March 07, 2003.   LABORATORY STUDIES:  April 12:  CBC revealed white blood cells at 7.3,  hemoglobin 9.4, hematocrit 27.1, platelets 315.  Chemistries included sodium  133, potassium 3.8, BUN 9, creatinine 0.9, glucose 116, calcium 8.1.   CONDITION ON DISCHARGE:  Improved.   INSTRUCTIONS ON DISCHARGE:   DISCHARGE MEDICATIONS:  She has been instructed to resume her home medicines  of:  1. Lotrel 5/20 mg p.o. every day.  2. Atenolol/chlorthalidone 15/25 mg one-half tab daily.  3. Evista 60 mg daily.  4. Calcium/vitamin D 600/200 mg daily.  5. Cosopt eye drops b.i.d.  6. Claritin p.r.n. for seasonal allergies.  She has also been instructed to use:  1. Colace 200 mg p.o. every day while she is taking her pain medication.  2. For pain management, she may have Tylox one to two p.o. q.4-6h. p.r.n.   ACTIVITY:  She has been asked to refrain from  any driving or heavy lifting. She has also been instructed to continue her breathing exercises and  daily  walking.   WOUND CARE:  She may shower beginning Sunday, April 18.  If her incision is  red, hot, swollen, draining, or if she has a fever greater than 101 degrees  Fahrenheit, she is to call Dr. Scheryl Darter office.    FOLLOW UP:  She has an appointment to see Dr. Edwyna Shell at the [facility] office on  Friday, April 23, at 9:30 in the morning.  She has been asked to have a  chest x-ray taken at [facility] at 8:30 that morning.     Toribio Harbour, R.N.                  Ines Bloomer, M.D.    CTK/MEDQ  D:  03/06/2003  T:  03/07/2003  Job:  [id number]   cc:   Theressa Millard, M.D.  [address] [place]  [place] [address]  Fax: [phone]   Valentino Hue. Magrinat, M.D.  [address] [facility]  [place]  [place] [address]  Fax: [phone]

## 2011-04-07 NOTE — Op Note (Signed)
Karen Kaiser, Karen Kaiser             ACCOUNT NO.:  [id number]   MEDICAL RECORD NO.:  [id number]          PATIENT TYPE:  AMB   LOCATION:  SDS                          FACILITY:  [facility]   PHYSICIAN:  Guadelupe Sabin, M.D.DATE OF BIRTH:  [birth year]   DATE OF PROCEDURE:  07/25/2005  DATE OF DISCHARGE:                                 OPERATIVE REPORT   PREOPERATIVE DIAGNOSES:  1.  Senile nuclear cataract, right eye.  2. Chronic open angle pigmentary      glaucoma, right eye.   POSTOPERATIVE DIAGNOSES:  1.  Senile nuclear cataract, right eye.  2. Chronic open angle pigmentary      glaucoma, right eye.   OPERATION:  Planned extracapsular cataract extraction - phacoemulsification,  primary insertion of posterior chamber intraocular lens implant.   SURGEON:  Stormy Fabian, M.D.   ASSISTANT:  Nurse.   ANESTHESIA:  Local 4% Xylocaine, 0.75 Marcaine retrobulbar block with Wydase  added, topical tetracaine, intraocular Xylocaine, anesthesia standby  required.   OPERATIVE PROCEDURE:  After the patient was prepped and draped, a lid  speculum was inserted in the right eye. Schiotz tonometry was recorded at  five scale units with a 5.5 grams weight. A peritomy was performed adjacent  to the limbus from the 11 to 1 o'clock position. The corneoscleral junction  was cleaned and a corneoscleral groove made with a 45 degrees Superblade.  The anterior chamber was then entered with a 2.5 mm diamond keratome at the  12 o'clock position and a 15 degrees blade at the 2:30 position. Using a  bent 26 gauge needle on an Ocucoat syringe, a circular capsulorrhexis was  begun and then completed with the Grabow forceps. Hydrodissection and  hydrodelineation were performed using 1% Xylocaine. The 30 degrees  phacoemulsification tip was then inserted with slow controlled  emulsification of the lens nucleus with back cracking with the Bechert pick.  Total ultrasonic time 54 seconds, average power level 21%,  total amount of  fluid used 50 mL. Following removal of the nucleus, the residual cortex was  aspirated with the irrigation-aspiration tip. The posterior capsule appeared  intact with a brilliant red fundus reflex. It was, therefore, elected to  insert an Allergan medical optics SI 40 NB silicone three-piece posterior  chamber intraocular lens implant, diopter strength +17.00. This was inserted  with the McDonald forceps into the anterior chamber and then centered into  the capsular bag using the [facility] lens rotator. The lens appeared to be well-  centered. The Ocucoat and Provisc which had been used intermittently during  the procedures were aspirated and replaced with balanced salt solution and  Miochol ophthalmic solution. The operative incisions appeared to be self-  sealing. It was elected, however, to place a single 10-0 interrupted nylon  suture across the longer 12 o'clock incision to ensure closure and to  prevent endophthalmitis. Maxitrol ointment was instilled in the conjunctival  cul-de-  sac and a light patch and protector shield applied. Duration of procedure  and anesthesia administration 45 minutes. The patient tolerated the  procedure well in general and left the operating room to the  recovery room  in good condition.           ______________________________  Guadelupe Sabin, M.D.     HNJ/MEDQ  D:  07/25/2005  T:  07/25/2005  Job:  [id number]

## 2011-04-07 NOTE — H&P (Signed)
NAME:  Karen Kaiser, Karen Kaiser                       ACCOUNT NO.:  [id number]   MEDICAL RECORD NO.:  [id number]                   PATIENT TYPE:  INP   LOCATION:  NA                                   FACILITY:  [facility]   PHYSICIAN:  Toribio Harbour, R.N.            DATE OF BIRTH:  [birth year]   DATE OF ADMISSION:  02/25/2003  DATE OF DISCHARGE:                                HISTORY & PHYSICAL   PRIMARY CARE PHYSICIAN:  Theressa Millard, M.D.   HISTORY OF PRESENT ILLNESS:  This is a 76 year old Caucasian  female who was  originally referred from Dr. Johna Sheriff for evaluation of a  right lower lobe  lung lesion. A CT scan obtained in May 2003 for followup of her breast  cancer as well as right upper quadrant abdominal pain revealed a 2-cm lesion  in the posterior segment of the right lower lobe of her lung. A repeat CT  scan in September revealed  minimal changes in this lesion. A December CT  scan revealed possible increased size and density of this lesion. Further  workup of her abdominal pain revealed a small,  less than  5-mm gallbladder  polyp.   She was evaluated by Dr. Edwyna Shell at the [facility] office on January 29, 2003. After  examination of the patient and review of the available records including her  CT scans. Dr. Edwyna Shell discussed her options of being proceeding with a VATS  versus a needle biopsy.  He further recommended to proceed with a PET scan,  and if it was positive to proceed with surgery. The PET scan was performed  on February 09, 2003, and was positive for malignant activity in the right lung  base.   She returned to the [facility]  office on February 10, 2003. Dr. Edwyna Shell discussed the  PET scan findings with the patient and her husband. Dr. Scheryl Darter  recommendation included proceeding with surgical intervention. The procedure  risks and benefits were discussed with the patient and she agreed with this  plan. She appeared today for her preoperative evaluation.   PAST MEDICAL HISTORY:  1.  Hypertension.  2. Breast cancer, ductal carcinoma in situ, June 1996, status post right     mastectomy.  3. Glaucoma.  4. Left cataract with intraoperative lens implant in June 2001.   ALLERGIES:  1. LATEX.  She recently had a skin reaction to wearing latex gloves. She has     had blood testing which confirms a true latex allergy.  2. She is intolerant to NORVASC at high doses (10 mg). Causes leg swelling     and facial flushing.  3. She is also intolerant to [facility] EYE DROPS. They cause joint problems.   MEDICATIONS PRIOR TO ADMISSION:  1. Lotrel 5/20 mg p.o. every day.  2. Atenolol/chlortalidone 50/25 mg 1/2 tablet daily.  3. Evista 60 mg p.o. every day.  4. Calcium and vitamin D 600/200 mg tablet b.i.d.  5. Cosopt eye drops1 gtt OU b.i.d.  6. Claritin 10 mg every day for seasonal allergies.   SOCIAL HISTORY:  The patient is married. She and her husband have 4  daughters between them. 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 She retired in 1997. She does not use tobacco and  does not drink alcohol. She and her husband live in a single level dwelling.  Both she and her husband drive and her husband will be available to assist  after hospital discharge.   FAMILY HISTORY:  Her mother died of a CVA at age 71. Her father died of CVA  at age 69. She has had a brother and sister die of cancer. She has 1 sister  who is living. She has diabetes.   REVIEW OF SYSTEMS:  The patient describes her health as good. She has had no  recent headaches or changes in her vision or difficulty with swallowing. She  does have some mild neck stiffness. She wears eyeglasses. She reports on  dyspnea, no frank cough. She has recently had a tickle in her throat from  seasonal allergies. She reports no chest pain or  pressure, no pedal edema.  Her weight has been stable at approximately 112 pounds. No symptoms of  reflux. No change in her bowel habits. She does have  mild right upper  abdominal pain as well as mild discomfort associated with her breast  prosthesis. She has no GU complaints. She has experienced no recent  dizziness or tremors. She does have occasional bilateral hand paresthesia.  She ambulates independently and does not use any assistive devices. She has  had no symptoms of claudication. She has had no recent fevers, no night  sweats. She does report bruising easily which has been long-term. No recent  nose bleeds. No history of blood clots. No recent symptoms of depression or  anxiety.   PHYSICAL EXAMINATION:  VITAL SIGNS:  Blood pressure 138/80 bilaterally,  heart rate 63.  GENERAL:  Height 5 feet, 2 inches, weight 112 pounds. This  is a  77 year old Caucasian  female in no acute distress.  HEENT:  PERRLA. EOMI. Oral mucosa pink and moist. Dentition is in good  repair.  NECK:  Full range of motion. No carotid bruits, thyromegaly or  lymphadenopathy.  RESPIRATORY:  Her breathing is unlabored. Her breath sounds are clear  bilaterally.  HEART:  Regular rate and rhythm, no murmurs appreciated today.  ABDOMEN:  Flat. She has positive bowel sounds. Her abdomen is soft. She does  have mild tenderness at the right intercostal border.  EXTREMITIES:  Lower extremities are without edema or varicosities. She does  have 2+ upper extremity pulses, 2+ posterior tibial pulses bilaterally.  NEUROLOGIC:  She is awake and oriented. Cranial nerves II through XII  grossly intact. Her gait is steady. She has full range of motion in all of  her extremities. Her muscle strength is 4/5 in all of her extremities.   LABORATORY DATA:  Her laboratory studies are pending. They will include CBC,  BMET, PT and INR.   A PET scan on February 09, 2003, revealed focal malignant range activity in the  right lung base consistent with nodule found on CT scan. Please see the  enclosed report for complete details.  IMPRESSION:  This is a 76 year old female with a right  lower lobe lung  lesion, suspicious for malignancy.   PLAN:  Electively admit to [facility] under the care  of Dr. Edwyna Shell  on February 25, 2003, for a VATS wedge resection of her right lower lobe lesion  with possible segmentectomy or lobectomy depending on operative findings.  Because of her latex allergy, appropriate precautions will be taken  throughout her hospital stay.   Her most recent CT scan was returned to [facility] after her PET scan. The patient  will retrieve these and bring them to the CVTS office.                                               Toribio Harbour, R.N.    CTK/MEDQ  D:  02/23/2003  T:  02/23/2003  Job:  [id number]

## 2011-04-07 NOTE — Consult Note (Signed)
NAME:  Karen Kaiser, Karen Kaiser                       ACCOUNT NO.:  [id number]   MEDICAL RECORD NO.:  [id number]                   PATIENT TYPE:  INP   LOCATION:  3304                                 FACILITY:  [facility]   PHYSICIAN:  Valentino Hue. Magrinat, M.D.            DATE OF BIRTH:  [birth year]   DATE OF CONSULTATION:  02/27/2003  DATE OF DISCHARGE:                                   CONSULTATION   HISTORY OF PRESENT ILLNESS:  The patient is a 76 year old [place] woman  referred by Dr. Edwyna Shell for evaluation of treatment in the setting of a newly  resected lung cancer.   The patient had a right lung mass noted by CT scan in May 2003 (the CT's  were obtained as part of her followup for breast cancer and to evaluate  abdominal pain).  This was a 1.5-cm right lower lobe lesion and it was  decided to follow up this with a repeat CT in September.  The patient had a  CT scan of the chest, abdomen, and pelvis May 2003 partly for followup of  her prior breast cancer and partly for abdominal pain evaluation.  The CT  found a small nodule in the right lower lobe.  It was decided to follow this  and in September 2003 a repeat CT showed the mass to be essentially  unchanged, measuring then 1.5 x 1.4 cm.  Again, this was followed and in  January 2004 repeat CT scan showed the mass to be essentially unchanged at  1.4 x 1.4 cm.  The patient was then referred to Dr. Edwyna Shell and he set up the  patient for a PET scan at [facility].  This showed the area in question in  the right lung to show increased uptake with no other significant areas  found.  With this information, the patient proceeded directly to RVATS and  wedge resection with the intraoperative pathology positive for carcinoma,  and therefore completion right lower lobectomy being performed; all in the  same day, February 25, 2003.   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  The patient is referred for further evaluation and  treatment.   PAST MEDICAL HISTORY:  1. Hypertension.  2. She had a right mastectomy in June 1996 for DCIS.  3. She has a history of glaucoma.  4. She is status post left cataract surgery.  5. She has a very small gallbladder polyp which is being followed and recent     ultrasound showed no change.  6. She has a history of heart murmur.  7. She has multiple liver cysts which again had been noted incidentally.   FAMILY HISTORY:  The patient's mother died at the age of 54 from a stroke.  The  patient's father died at the age of 68 from a stroke.   ALLERGIES:  The patient has an allergy to LATEX.   MEDICATIONS:  1. Lotrel 5/20.  2. Atenolol.  3. Chlorthalidone 50/25.  4. Evista.  5. Calcium with D.  6. Norvasc.  7. Xalatan.  8. Cosopt ophthalmic drops.   REVIEW OF SYSTEMS:  Currently, the patient is sedated, having taken some  morphine for postoperative pain.  She really has been doing quite well since  the procedure.  She has had some right upper quadrant discomfort, which  again was evaluated recently with an ultrasound as discussed below.  She has  had no symptoms suggestive of metastatic spread, according to the husband,  Onalee Hua, who was present.   PHYSICAL EXAMINATION:  VITAL SIGNS:  Her temperature was 97.4, heart rate  60, respiratory rate 16, and blood pressure 138/80.  She is 5 feet 2 inches  tall and weight 112 pounds.  GENERAL:  Minimal physical exam was performed, the patient being  postoperative, but I do not palpate any peripheral adenopathy.   LABORATORY DATA:  Lab work this admission shows a white cell count of 10.2,  MCV 96, platelet count 253,000, hemoglobin 14.6.  CMET was unremarkable.  Specifically, the liver function tests were completely normal with an AST of  20, ALT 12, ALP 51,  and total bilirubin 0.5.  Albumin was 3.8 and creatinine  0.8 with a BUN of 12.   Other studies include pulmonary function tests showing an FVC of 2.78 and an  FEV1 of 2.08.   Coags were normal.   FILMS:  In addition to the films already described, the patient had a recent  ultrasound of the abdomen (February 13, 2003) which shows a benign-appearing  gallbladder polyp.  No abnormality in the pancreatic area and no interval  change as compared to a prior ultrasound from July 2003.   IMPRESSION AND PLAN:  A 76 year old [place] woman status post right lower  lobectomy February 25, 2003 for a 1.9-cm  well-differentiated mucinous adenocarcinoma with evidence of vascular  invasion but 0/5 lymph nodes positive (four N1 and one N2 and negative  margins).   I quoted her a 25% or so risk of recurrence with surgery alone.  This means  she has a 75% chance of cure as of today.  She could bring this down by a  few percent, say from 25% to 21%, by taking three cycles of cisplatinum-  based chemotherapy.  Put in more specific terms, that means she would have a  96% chance of receiving no benefit from moderately toxic chemotherapy.  Accordingly, this is best decided by the patient.  I am comfortable with  followup only (no adjuvant chemotherapy).  I am also comfortable proceeding  with three cycles of chemotherapy if that is her choice.  She is very  sedated now and will be thinking about this.  I will see her again on April  12 for a final decision.   The patient has not had a bone scan or brain CT as best as I can tell and  would obtain that prior to discharge.   I note also that the pathology report raises the question that this tumor  could be metastatic from a gastrointestinal primary, since it is a mucinous  adenocarcinoma; however, she had a negative colonoscopy October 2003 under Dr. Vida Rigger.  She also had a negative CT of the abdomen and pelvis in May  2003, as well as  two negative  abdominal ultrasounds since that time.  There  is no evidence of any gastrointestinal primary at this point.                                               Valentino Hue. Magrinat, M.D.    Ronna Polio  D:  02/27/2003  T:  02/28/2003  Job:  [id number]   cc:   Lorne Skeens. Hoxworth, M.D.  [address]  [place]  [place] [address]  Fax: [phone]   Theressa Millard, M.D.  [address] [place]  [place] [address]  Fax: [phone]   Petra Kuba, M.D.  [address]  [place]  [place] [address]  Fax: [phone]

## 2011-04-07 NOTE — Op Note (Signed)
NAME:  Karen Kaiser, Karen Kaiser                       ACCOUNT NO.:  [id number]   MEDICAL RECORD NO.:  [id number]                   PATIENT TYPE:  AMB   LOCATION:  ENDO                                 FACILITY:  [facility]   PHYSICIAN:  Petra Kuba, M.D.                 DATE OF BIRTH:  Feb 24, 1935   DATE OF PROCEDURE:  01/12/2004  DATE OF DISCHARGE:                                 OPERATIVE REPORT   PROCEDURE:  Esophagogastroduodenoscopy with biopsy.   INDICATIONS:  Episodic upper tract symptoms.  Want to rule out abnormality.  Consent was signed after risks, benefits, methods, options were thoroughly  discussed in the office on multiple occasions.   MEDICATIONS:  Demerol 40, Versed 5.   DESCRIPTION OF PROCEDURE:  The video endoscope was inserted by direct  vision.  The proximal and mid esophagus were normal.  In the distal  esophagus was a small hiatal hernia and a widely patent, thin ring.  The  scope passed into the stomach, advanced through a normal antrum, normal  pylorus into a normal duodenal bulb, around the C-loop to a normal second  portion of the duodenum.  The scope was withdrawn back to the bulb which  again confirmed its normal appearance.  Scope was withdrawn back to the  stomach and retroflexed.  Annularis was normal.  High in the cardia the  hiatal hernia was confirmed.  Fundus was normal.  The proximal lesser and  greater curve were normal on retroflexed visualization.  Visualization of  the stomach, however, did show a mid great curve small ulcer but no other  abnormalities were seen on straight visualization.  We went ahead and took a  few biopsies of the ulcer, two of the antrum and two of the proximal stomach  to rule out Helicobacter.  We also possibly saw a tiny mid great curve  polyp.  Again two biopsies of that were obtained as well and all specimens  were put in the same container.  Air was suctioned.  The scope was  withdrawn. Again a good look at the esophagus was  normal.  The scope was  removed.  The patient tolerated the procedure well.  There was no obvious  immediate complications.   ENDOSCOPIC DIAGNOSES:  1. Small hiatal hernia with a widely patent thin ring.  2. Mid greater curve small ulcer, status post biopsy.  3. Questionable tiny polyp on the great curve, status post biopsy.  4. Otherwise normal esophagogastroduodenoscopy, status post antral and     proximal stomach biopsies to rule out Helicobacter.   PLAN:  Await pathology.  Consider H2 blockers or pump inhibitors.  Will go  ahead and start over-the-counter Prilosec which should be cost effective.  Follow up with me in two months to recheck symptoms, and call me sooner  p.r.n. and we will touch base about the pathology.  Possibly she will need a  repeat of endoscopy to  document Helicobacter although if these biopsies are  okay and her symptoms are helped by pump inhibitors, probably can hold off.                                               Petra Kuba, M.D.    MEM/MEDQ  D:  01/12/2004  T:  01/12/2004  Job:  [id number]   cc:   Lorne Skeens. Hoxworth, M.D.  [address]  [place]  [place] [address]  Fax: [phone]   Theressa Millard, M.D.  [address] [place]  [place] [address]  Fax: [phone]   Valentino Hue. Magrinat, M.D.  [address] [facility]  [place]  [place] [address]  Fax: [phone]   Ines Bloomer, M.D.  [address]  [place]  [place] [address]

## 2011-04-07 NOTE — Op Note (Signed)
[name], [name]             ACCOUNT NO.:  [id number]   MEDICAL RECORD NO.:  [id number]          PATIENT TYPE:  AMB   LOCATION:  DSC                          FACILITY:  [facility]   PHYSICIAN:  Rose Phi. Young, M.D.   DATE OF BIRTH:  [birth year]   DATE OF PROCEDURE:  12/26/2004  DATE OF DISCHARGE:                                 OPERATIVE REPORT   PREOPERATIVE DIAGNOSIS:  Papilloma left breast.   POSTOPERATIVE DIAGNOSIS:  Papilloma left breast.   OPERATION:  Excision of duct system of the left breast.   SURGEON:  Rose Phi. Maple Hudson, M.D.   ANESTHESIA:  MAC.   OPERATIVE PROCEDURE:  The patient placed on the operating table and the left  breast prepped and draped in usual fashion.  The area of discharge was  coming from the 12 o'clock position so a circumareolar incision was  outlined, centered at 12 o'clock position of the left breast.. The area was  then thoroughly infiltrated with local anesthetic mixture. Incision was made  and then I raised an areolar flap and then excised the central and the  superior ducts including some of the breast tissue at 12 o'clock position.  There was a lot of cheesy pasty material coming from the duct system suture.  Following removal of this, I got good hemostasis with cautery. A  subcuticular closure 4-0 Monocryl and Steri-Strips carried out. Dressing  were applied and the patient transferred to the recovery room in  satisfactory condition having tolerated procedure well.      PRY/MEDQ  D:  12/26/2004  T:  12/26/2004  Job:  [id number]

## 2011-04-07 NOTE — Op Note (Signed)
NAME:  Karen Kaiser, Karen Kaiser                       ACCOUNT NO.:  [id number]   MEDICAL RECORD NO.:  [id number]                   PATIENT TYPE:  INP   LOCATION:  3304                                 FACILITY:  [facility]   PHYSICIAN:  Ines Bloomer, M.D.              DATE OF BIRTH:  10/28/1935   DATE OF PROCEDURE:  02/25/2003  DATE OF DISCHARGE:                                 OPERATIVE REPORT   PREOPERATIVE DIAGNOSIS:  Right lower lobe mass, positive PET scan.   POSTOPERATIVE DIAGNOSIS:  Bronchoalveolar cancer, right lower lobe.   PROCEDURES:  1. Right video-assisted thoracoscopic surgery.  2. Right thoracotomy.  3. Right lower lobectomy.   SURGEON:  Ines Bloomer, M.D.   ASSISTANT:  Eber Hong, P.A.   DESCRIPTION OF PROCEDURE:  After percutaneous insertion of all monitoring  lines, the patient was turned to the right lateral thoracotomy position and  was prepped and draped in the usual sterile manner.  Two trocar sites were  made in the anterior and posterior axillary lines at the seventh and eight  intercostal space.  Two trocars were inserted.  The patient was found to  have a lesion in the posterior basilar segment of the right lower lobe.  A 3  cm incision was made over the sixth intercostal space, and this allowed [place]  to get right down on the lesion, and this was wedged out with three  applications of the EZ-45 stapler.  Frozen section revealed bronchoalveolar  cancer with close margins.  It was decided to do a right lower lobectomy.  Through the same incision an incision was made in the fifth intercostal  space, and the previous incision was increased to 2 more to 5-6 cm.  The  latissimus was partially divided and the serratus was split.  The fifth  intercostal space was entered.  Two Tuffiers were placed at right angles.  The inferior pulmonary ligament was taken down, giving me a 9R node, and  dissection was started in the inferior portion of the fissure, and  this was  dissected out and divided with an EZ-45 stapler.  Then going superiorly, the  superior portion of the fissure was dissected out and divided with an EZ-45  stapler.  The inferior pulmonary vein was dissected out and looped with a  vascular tape.  Attention was then turned to the pulmonary artery. and the  basilar branches and the superior segmental branch of the pulmonary artery  were dissected out and looped with a vascular tape.  The pulmonary artery  was divided with the Autosuture stapler and then the inferior pulmonary vein  was also divided with the Autosuture stapler.  That left the bronchus that  was dissected up, clamped with the EZ-45 stapler.  The lung was expanded and  the right middle lobe and the right upper lobe expanded easily.  The stapler  was fired and the right lower lobe was  removed.  Two chest tubes were  brought through the trocar sites and tied in place with 0 silk.  The chest  was closed.  A Marcaine block was done in the usual fashion.  The chest was  closed with two  paracostals.  Underneath the paracostals we put the On-Q catheters.  Wounds  were closed with interrupted #1 Vicryl in the muscle layer, 2-0 Vicryl in  the subcutaneous tissue, and Ethicon skin clips.  The patient returned to  the recovery room in stable condition.                                               Ines Bloomer, M.D.    DPB/MEDQ  D:  02/25/2003  T:  02/26/2003  Job:  [id number]   cc:   Lorne Skeens. Hoxworth, M.D.  [address], [address]  [place]  [place] [address]  Fax: [phone]

## 2011-04-13 ENCOUNTER — Other Ambulatory Visit: Payer: Self-pay | Admitting: Oncology

## 2011-04-13 ENCOUNTER — Encounter (HOSPITAL_BASED_OUTPATIENT_CLINIC_OR_DEPARTMENT_OTHER): Payer: Medicare Other | Admitting: Oncology

## 2011-04-13 ENCOUNTER — Ambulatory Visit (HOSPITAL_COMMUNITY)
Admission: RE | Admit: 2011-04-13 | Discharge: 2011-04-13 | Disposition: A | Discharge status: Home / Self Care | Payer: Medicare Other | Source: Ambulatory Visit | Attending: Oncology | Admitting: Oncology

## 2011-04-13 DIAGNOSIS — R918 Other nonspecific abnormal finding of lung field: Secondary | ICD-10-CM

## 2011-04-13 DIAGNOSIS — Z85118 Personal history of other malignant neoplasm of bronchus and lung: Secondary | ICD-10-CM

## 2011-04-13 DIAGNOSIS — J984 Other disorders of lung: Secondary | ICD-10-CM | POA: Insufficient documentation

## 2011-04-13 DIAGNOSIS — Z853 Personal history of malignant neoplasm of breast: Secondary | ICD-10-CM | POA: Insufficient documentation

## 2011-04-13 LAB — CBC WITH DIFFERENTIAL/PLATELET
BASO%: 0.3 % (ref 0.0–2.0)
EOS%: 2.7 % (ref 0.0–7.0)
MCH: 30 pg (ref 25.1–34.0)
MCHC: 33.1 g/dL (ref 31.5–36.0)
NEUT%: 59.6 % (ref 38.4–76.8)
RBC: 4.07 10*6/uL (ref 3.70–5.45)
RDW: 14.7 % — ABNORMAL HIGH (ref 11.2–14.5)
lymph#: 3.2 10*3/uL (ref 0.9–3.3)

## 2011-04-13 LAB — COMPREHENSIVE METABOLIC PANEL
ALT: 31 U/L (ref 0–35)
AST: 26 U/L (ref 0–37)
Calcium: 9.5 mg/dL (ref 8.4–10.5)
Chloride: 103 mEq/L (ref 96–112)
Creatinine, Ser: 0.63 mg/dL (ref 0.40–1.20)
Potassium: 3.9 mEq/L (ref 3.5–5.3)

## 2011-04-20 ENCOUNTER — Encounter (HOSPITAL_BASED_OUTPATIENT_CLINIC_OR_DEPARTMENT_OTHER): Payer: Medicare Other | Admitting: Oncology

## 2011-04-20 DIAGNOSIS — Z853 Personal history of malignant neoplasm of breast: Secondary | ICD-10-CM

## 2011-04-20 DIAGNOSIS — Z85118 Personal history of other malignant neoplasm of bronchus and lung: Secondary | ICD-10-CM

## 2011-10-21 ENCOUNTER — Telehealth: Payer: Self-pay | Admitting: Oncology

## 2011-10-21 NOTE — Telephone Encounter (Signed)
per pof 05/31 called pts home lmovm for appt in [id number] with a chest xray same day as lab appt.  asked pt to rtn call to confirm appts

## 2011-10-23 ENCOUNTER — Telehealth: Payer: Self-pay | Admitting: Oncology

## 2011-10-23 NOTE — Telephone Encounter (Signed)
pt rtn call and confirm appts for [id number]

## 2011-10-24 ENCOUNTER — Other Ambulatory Visit: Payer: Self-pay | Admitting: Oncology

## 2011-10-24 DIAGNOSIS — C3490 Malignant neoplasm of unspecified part of unspecified bronchus or lung: Secondary | ICD-10-CM

## 2011-11-18 ENCOUNTER — Other Ambulatory Visit: Payer: Self-pay | Admitting: Orthopedic Surgery

## 2011-11-23 DIAGNOSIS — M1990 Unspecified osteoarthritis, unspecified site: Secondary | ICD-10-CM | POA: Diagnosis not present

## 2011-11-23 DIAGNOSIS — M064 Inflammatory polyarthropathy: Secondary | ICD-10-CM | POA: Diagnosis not present

## 2011-11-23 DIAGNOSIS — M2550 Pain in unspecified joint: Secondary | ICD-10-CM | POA: Diagnosis not present

## 2011-12-04 DIAGNOSIS — H401390 Pigmentary glaucoma, unspecified eye, stage unspecified: Secondary | ICD-10-CM | POA: Diagnosis not present

## 2012-01-01 ENCOUNTER — Other Ambulatory Visit: Payer: Self-pay | Admitting: Oncology

## 2012-01-01 DIAGNOSIS — Z1231 Encounter for screening mammogram for malignant neoplasm of breast: Secondary | ICD-10-CM

## 2012-01-01 DIAGNOSIS — Z9011 Acquired absence of right breast and nipple: Secondary | ICD-10-CM

## 2012-01-17 DIAGNOSIS — M949 Disorder of cartilage, unspecified: Secondary | ICD-10-CM | POA: Diagnosis not present

## 2012-01-17 DIAGNOSIS — Z124 Encounter for screening for malignant neoplasm of cervix: Secondary | ICD-10-CM | POA: Diagnosis not present

## 2012-01-18 ENCOUNTER — Other Ambulatory Visit: Payer: Self-pay | Admitting: Oncology

## 2012-01-18 DIAGNOSIS — Z9011 Acquired absence of right breast and nipple: Secondary | ICD-10-CM

## 2012-01-18 DIAGNOSIS — J019 Acute sinusitis, unspecified: Secondary | ICD-10-CM | POA: Diagnosis not present

## 2012-01-30 ENCOUNTER — Encounter (HOSPITAL_COMMUNITY): Payer: Self-pay | Admitting: Pharmacy Technician

## 2012-02-02 ENCOUNTER — Ambulatory Visit (HOSPITAL_COMMUNITY)
Admission: RE | Admit: 2012-02-02 | Discharge: 2012-02-02 | Disposition: A | Discharge status: Home / Self Care | Payer: Medicare Other | Source: Ambulatory Visit | Attending: Orthopedic Surgery | Admitting: Orthopedic Surgery

## 2012-02-02 ENCOUNTER — Other Ambulatory Visit: Payer: Self-pay

## 2012-02-02 ENCOUNTER — Encounter (HOSPITAL_COMMUNITY)
Admission: RE | Admit: 2012-02-02 | Discharge: 2012-02-02 | Disposition: A | Discharge status: Home / Self Care | Payer: Medicare Other | Source: Ambulatory Visit | Attending: Orthopedic Surgery | Admitting: Orthopedic Surgery

## 2012-02-02 ENCOUNTER — Encounter (HOSPITAL_COMMUNITY): Payer: Self-pay

## 2012-02-02 DIAGNOSIS — Z01811 Encounter for preprocedural respiratory examination: Secondary | ICD-10-CM | POA: Diagnosis not present

## 2012-02-02 DIAGNOSIS — I10 Essential (primary) hypertension: Secondary | ICD-10-CM | POA: Diagnosis not present

## 2012-02-02 DIAGNOSIS — K219 Gastro-esophageal reflux disease without esophagitis: Secondary | ICD-10-CM | POA: Insufficient documentation

## 2012-02-02 DIAGNOSIS — M1710 Unilateral primary osteoarthritis, unspecified knee: Secondary | ICD-10-CM | POA: Insufficient documentation

## 2012-02-02 DIAGNOSIS — Z85118 Personal history of other malignant neoplasm of bronchus and lung: Secondary | ICD-10-CM | POA: Diagnosis not present

## 2012-02-02 DIAGNOSIS — Z01818 Encounter for other preprocedural examination: Secondary | ICD-10-CM | POA: Diagnosis not present

## 2012-02-02 DIAGNOSIS — Z01810 Encounter for preprocedural cardiovascular examination: Secondary | ICD-10-CM | POA: Diagnosis not present

## 2012-02-02 DIAGNOSIS — Z01812 Encounter for preprocedural laboratory examination: Secondary | ICD-10-CM | POA: Insufficient documentation

## 2012-02-02 HISTORY — DX: Other specified postprocedural states: Z98.890

## 2012-02-02 HISTORY — DX: Essential (primary) hypertension: I10

## 2012-02-02 HISTORY — DX: Acute upper respiratory infection, unspecified: J06.9

## 2012-02-02 HISTORY — DX: Nausea with vomiting, unspecified: R11.2

## 2012-02-02 HISTORY — DX: Peripheral vascular disease, unspecified: I73.9

## 2012-02-02 HISTORY — DX: Malignant (primary) neoplasm, unspecified: C80.1

## 2012-02-02 HISTORY — DX: Gastro-esophageal reflux disease without esophagitis: K21.9

## 2012-02-02 HISTORY — DX: Unspecified osteoarthritis, unspecified site: M19.90

## 2012-02-02 HISTORY — DX: Cardiac murmur, unspecified: R01.1

## 2012-02-02 LAB — URINALYSIS, ROUTINE W REFLEX MICROSCOPIC
Bilirubin Urine: NEGATIVE
Glucose, UA: NEGATIVE mg/dL
Hgb urine dipstick: NEGATIVE
Ketones, ur: NEGATIVE mg/dL
Protein, ur: NEGATIVE mg/dL
Urobilinogen, UA: 0.2 mg/dL (ref 0.0–1.0)

## 2012-02-02 LAB — COMPREHENSIVE METABOLIC PANEL
ALT: 19 U/L (ref 0–35)
Alkaline Phosphatase: 68 U/L (ref 39–117)
BUN: 20 mg/dL (ref 6–23)
CO2: 26 mEq/L (ref 19–32)
Chloride: 90 mEq/L — ABNORMAL LOW (ref 96–112)
GFR calc Af Amer: >90 mL/min (ref >90)
GFR calc non Af Amer: 82 mL/min — ABNORMAL LOW (ref >90)
Glucose, Bld: 111 mg/dL — ABNORMAL HIGH (ref 70–99)
Sodium: 129 mEq/L — ABNORMAL LOW (ref 135–145)
Total Bilirubin: 0.4 mg/dL (ref 0.3–1.2)
Total Protein: 7.5 g/dL (ref 6.0–8.3)

## 2012-02-02 LAB — CBC
MCH: 30.3 pg (ref 26.0–34.0)
MCV: 91.4 fL (ref 78.0–100.0)
Platelets: 462 10*3/uL — ABNORMAL HIGH (ref 150–400)
RDW: 13.6 % (ref 11.5–15.5)
WBC: 9.2 10*3/uL (ref 4.0–10.5)

## 2012-02-02 LAB — PROTIME-INR: Prothrombin Time: 13.4 seconds (ref 11.6–15.2)

## 2012-02-02 MED ORDER — AMLODIPINE BESYLATE 5 MG PO TABS: 5.0000 mg | ORAL_TABLET | Freq: Every day | ORAL | Status: DC

## 2012-02-02 NOTE — Patient Instructions (Signed)
20 Karen Kaiser  02/02/2012   Your procedure is scheduled on:  02/12/12 0955am-1100am  Report to [facility] at 0730 AM.  Call this number if you have problems the morning of surgery: [phone]   Remember:   Do not eat food:After Midnight.  May have clear liquids:until Midnight .  Marland Kitchen  Take these medicines the morning of surgery with A SIP OF WATER:    Do not wear jewelry, make-up or nail polish.  Do not wear lotions, powders, or perfumes.   Do not shave 48 hours prior to surgery.  Do not bring valuables to the hospital.  Contacts, dentures or bridgework may not be worn into surgery.  Leave suitcase in the car. After surgery it may be brought to your room.  For patients admitted to the hospital, checkout time is 11:00 AM the day of discharge.      Special Instructions: CHG Shower Use Special Wash: 1/2 bottle night before surgery and 1/2 bottle morning of surgery.   Please read over the following fact sheets that you were given: MRSA Information, Incentive Spirometry Fact sheet, Blood Transfusion Fact sheet , coughing and deep breathing exercises, leg exercises

## 2012-02-02 NOTE — Pre-Procedure Instructions (Signed)
2/18/placed on Zithromax sinus xray 01/18/12  Showed clear per pt Afrin nasal spray and saline sprays improved per pt  But pt states she has yellow, green, from nose - pt states she has not been followed by PCP since . Pt to followup with PCP and to see DR Aluisio on 02/05/12

## 2012-02-05 NOTE — Pre-Procedure Instructions (Signed)
02/05/12 Page Kenard Gower, PA regarding abnormal lab results. Kenard Gower , PA returned page and nurse reported abnormal Sodium of 129.

## 2012-02-06 NOTE — H&P (Signed)
Karen Kaiser DOB: [birth year]  Chief Complaint: right knee pain  History of Present Illness The patient is a 77 year old female who comes in today for a preoperative History and Physical. The patient is scheduled for a right total knee arthroplasty to be performed by Dr. Gus Rankin. Aluisio, MD at [facility] on Monday February 12, 2012 . Patient received surgical clearance by Dr. Zenovia Jordan at [facility]. She is a 77 year old female who has seen Dr. Lequita Halt for 2-3 years for her bilateral knee pain. Her right knee causes her more porblems than the left. She has had cortisone injections as well as viscosupplementation with minimal temporary relief. At her last visit she was not prepared for surgery yet, but called and spoke with Dr. Lequita Halt in January stating that she was ready for a permanent solution. Most predictable means for decreased pain and increased function in the right knee is a right total knee arthroplasty. Risks and benefits of the surgery discussed with the patient. PCP: Dr. Theressa Millard Rheum: Dr. Zenovia Jordan Onco:Dr. Ruthann Cancer   Past MedicalHistory Pain in joint, lower leg (719.46). 12/15/2008 Tear, medial meniscus, knee, current (836.0). 01/07/2009 Hallux valgus, acquired (735.0). 02/03/2011 Osteoarthrosis NOS, lower leg (715.96). 02/28/2011 Breast Cancer Gastroesophageal Reflux Disease Gout Heart murmur High blood pressure Lung Cancer Osteoarthritis Osteoporosis Rheumatoid Arthritis Glaucoma Cataract Varicose veins Ulcer disease. gastric ulcer with lung cancer Hemorrhoids Urinary Incontinence. and urgency Cancer. lung 2004 and breast 1996 Scarlet Fever Measles Mumps Menopause   Allergies Latex. 12/18/2008 Xalatan *OPHTHALMIC AGENTS* AmLODIPine Besylate *CHEMICALS*. greater than 5mg    Family History Cancer. sister (ovarian, breast) and brother (lung and brain) Cerebrovascular Accident. mother and father Diabetes  Mellitus. mother and sister Hypertension. mother, sister and child Osteoarthritis. mother Father. deceased age 40, stroke Mother. deceased age 38, stroke. DM, HTN   Social History Tobacco use. Never smoker. never smoker Alcohol use. never consumed alcohol Children. 2 Current work status. retired Financial planner (Currently). no Drug/Alcohol Rehab (Previously). no Illicit drug use. no Living situation. live with spouse Marital status. married Number of flights of stairs before winded. 1 Pain Contract. no Tobacco / smoke exposure. no Caregiver. after surgery: husband Advance Directives. None   Medication History Amlodipine Besy-Benazepril HCl (5-40MG  Capsule, Oral daily) Active. Calcium Citrate (950MG  Tablet, 1 Oral daily) Active. Multiple Vitamin (1 Oral daily) Active. Atenolol (50MG  Tablet, Oral daily) Active. Colcrys (0.6MG  Tablet, Oral daily) Active. Pantoprazole Sodium (40MG  Tablet DR, Oral daily) Active. Diclofenac Sodium (75MG  Tablet DR, Oral daily) Active. Glucosamine Complex (1 Oral two times daily) Active. Tylenol (500MG  Capsule, 2 Oral two times daily, as needed) Active. Vitamin D (1000UNIT Tablet, 1 Oral) Active. (five times per week) Chlorthalidone (25MG  Tablet, Oral daily) Active. Cosopt Ocumeter Plus (2-0.5% Solution, Ophthalmic two times daily) Active. EpiPen (0.3MG /0.3ML Device, Injection as needed) Active.   Pregnancy  Pregnant. no   Past Surgical History Breast Biopsy. left 2006 Cataract Surgery. bilateral left 2001, right 2006 Dilation and Curettage of Uterus Lung Surgery . right lower lobectomy 2004 Mastectomy. right 1996 Rotator Cuff Repair. right 2011   Diagnostic Studies History EKG. Normal. 02/02/12   Review of Systems General:Not Present- Chills, Fever, Night Sweats, Appetite Loss, Fatigue, Feeling sick, Weight Gain, Weight Loss and Memory Loss. Skin:Not Present- Hives, Itching, Rash, Skin Color  Changes, Ulcer, Psoriasis, Change in Hair or Nails, Eczema and Lesions. HEENT:Not Present- Sensitivity to light, Hearing problems, Tinnitus, Nose Bleed, Headache, Double Vision, Visual Loss, Hearing Loss, Ringing in the Ears and  Dentures. Neck:Not Present- Swollen Glands and Neck Mass. Respiratory:Not Present- Shortness of breath with exertion, Shortness of breath at rest, Allergies, Coughing up blood, Snoring, Chronic Cough, Bloody sputum and Dyspnea. Cardiovascular:Present- Leg Cramps. Not Present- Shortness of Breath, Chest Pain, Swelling of Extremities, Racing/skipping heartbeats, Difficulty Breathing Lying Down, Murmur, Swelling and Palpitations. Gastrointestinal:Not Present- Bloody Stool, Heartburn, Abdominal Pain, Vomiting, Nausea, Constipation, Diarrhea, Difficulty Swallowing, Incontinence of Stool, Jaundice and Loss of appetitie. Female Genitourinary:Present- Incontinence, Nocturia and Urgency. Not Present- Blood in Urine, Urinary frequency, Menstrual Irregularities, Weak urinary stream, Discharge, Flank Pain, Frequency, Painful Urination, Urinary Retention and Urinating at Night. Musculoskeletal:Present- Joint Stiffness, Joint Swelling, Joint Pain and Back Pain. Not Present- Muscle Weakness, Muscle Pain, Morning Stiffness and Spasms. Neurological:Not Present- Tingling, Numbness, Burning, Tremor, Headaches, Dizziness, Blackout spells, Paralysis, Difficulty with balance and Weakness. Psychiatric:Not Present- Anxiety, Depression, Memory Loss and Insomnia. Endocrine:Not Present- Cold Intolerance, Heat Intolerance, Excessive hunger and Excessive Thirst. Hematology:Not Present- Abnormal Bleeding, Anemia, Blood Clots and Easy Bruising.   Vitals 02/05/2012 2:31 PM Weight: 123 lb Height: 61.5 in Body Surface Area: 1.56 m Body Mass Index: 22.86 kg/m Pulse: 72 (Regular) Resp.: 18 (Unlabored) BP: 130/76 (Sitting, Left Arm, Standard)   Physical Exam General Mental  Status - Alert, cooperative and good historian. General Appearance- pleasant. Not in acute distress. Orientation- Oriented X3. Build & Nutrition- Well nourished and Well developed. Head and Neck Head- normocephalic, atraumatic . Neck Global Assessment- supple. no bruit auscultated on the right and no bruit auscultated on the left. Eye Pupil- Bilateral- PERRLA. Motion- Bilateral- EOMI. Chest and Lung Exam Auscultation: Breath sounds:Absent- Right Lower Lobe (Posterior) (otherwise unremarkable). Adventitious sounds:- No Adventitious sounds. Cardiovascular Auscultation:Rhythm- Regular rate and rhythm. Heart Sounds- S1 WNL and S2 WNL. Murmurs & Other Heart Sounds: Murmur 1:Location- Aortic Area. Timing- Early systolic. Grade- III/VI. Abdomen Palpation/Percussion:Tenderness- Abdomen is non-tender to palpation. Rigidity (guarding)- Abdomen is soft. Auscultation:Auscultation of the abdomen reveals - Bowel sounds normal. Female Genitourinary Not done, not pertinent to present illness Peripheral Vascular Upper Extremity: Palpation:- Pulses bilaterally normal. Lower Extremity: Palpation:- Pulses bilaterally normal. Neurologic Examination of related systems reveals - normal muscle strength and tone in all extremities. Neurologic evaluation reveals - normal sensation and upper and lower extremity deep tendon reflexes intact bilaterally . Musculoskeletal Both knees show no effusion. She has marked crepitus on range of motion right worse than the left knee. There is no instability noted. Hips have normal painless ROM. Tenderness to palpation along the lateral joint line in the right knee.   Radiographs: Significant lateral compartment narrowing of the right knee with varus deformity present.  Assessment & Plan Osteoarthritis, Knee (715.96) Right total knee arthroplasty     Dimitri Ped, PA-C

## 2012-02-08 ENCOUNTER — Ambulatory Visit
Admission: RE | Admit: 2012-02-08 | Discharge: 2012-02-08 | Disposition: A | Discharge status: Home / Self Care | Payer: Medicare Other | Source: Ambulatory Visit | Attending: Oncology | Admitting: Oncology

## 2012-02-08 DIAGNOSIS — Z1231 Encounter for screening mammogram for malignant neoplasm of breast: Secondary | ICD-10-CM

## 2012-02-08 DIAGNOSIS — Z9011 Acquired absence of right breast and nipple: Secondary | ICD-10-CM

## 2012-02-08 DIAGNOSIS — M810 Age-related osteoporosis without current pathological fracture: Secondary | ICD-10-CM | POA: Diagnosis not present

## 2012-02-12 ENCOUNTER — Encounter (HOSPITAL_COMMUNITY)
Admission: RE | Disposition: A | Discharge status: Home Health | Payer: Self-pay | Source: Ambulatory Visit | Attending: Orthopedic Surgery

## 2012-02-12 ENCOUNTER — Encounter (HOSPITAL_COMMUNITY): Payer: Self-pay | Admitting: *Deleted

## 2012-02-12 ENCOUNTER — Encounter (HOSPITAL_COMMUNITY): Payer: Self-pay | Admitting: Anesthesiology

## 2012-02-12 ENCOUNTER — Inpatient Hospital Stay (HOSPITAL_COMMUNITY)
Admission: RE | Admit: 2012-02-12 | Discharge: 2012-02-16 | DRG: 470 | Disposition: A | Discharge status: Home Health | Payer: Medicare Other | Source: Ambulatory Visit | Attending: Orthopedic Surgery | Admitting: Orthopedic Surgery

## 2012-02-12 ENCOUNTER — Inpatient Hospital Stay (HOSPITAL_COMMUNITY): Payer: Medicare Other | Admitting: Anesthesiology

## 2012-02-12 DIAGNOSIS — I10 Essential (primary) hypertension: Secondary | ICD-10-CM | POA: Diagnosis not present

## 2012-02-12 DIAGNOSIS — M1710 Unilateral primary osteoarthritis, unspecified knee: Secondary | ICD-10-CM | POA: Diagnosis not present

## 2012-02-12 DIAGNOSIS — IMO0002 Reserved for concepts with insufficient information to code with codable children: Secondary | ICD-10-CM | POA: Diagnosis not present

## 2012-02-12 DIAGNOSIS — K219 Gastro-esophageal reflux disease without esophagitis: Secondary | ICD-10-CM | POA: Diagnosis not present

## 2012-02-12 DIAGNOSIS — M25569 Pain in unspecified knee: Secondary | ICD-10-CM | POA: Diagnosis not present

## 2012-02-12 DIAGNOSIS — E876 Hypokalemia: Secondary | ICD-10-CM

## 2012-02-12 DIAGNOSIS — I798 Other disorders of arteries, arterioles and capillaries in diseases classified elsewhere: Secondary | ICD-10-CM | POA: Diagnosis not present

## 2012-02-12 DIAGNOSIS — Z96659 Presence of unspecified artificial knee joint: Secondary | ICD-10-CM

## 2012-02-12 DIAGNOSIS — E871 Hypo-osmolality and hyponatremia: Secondary | ICD-10-CM

## 2012-02-12 HISTORY — PX: TOTAL KNEE ARTHROPLASTY: SHX125

## 2012-02-12 LAB — TYPE AND SCREEN: Antibody Screen: NEGATIVE

## 2012-02-12 LAB — ABO/RH: ABO/RH(D): A NEG

## 2012-02-12 SURGERY — ARTHROPLASTY, KNEE, TOTAL
Anesthesia: Spinal | Site: Knee | Laterality: Right | Wound class: Clean

## 2012-02-12 MED ORDER — ATENOLOL-CHLORTHALIDONE 50-25 MG PO TABS: 1.0000 | ORAL_TABLET | ORAL | Status: DC

## 2012-02-12 MED ORDER — TEMAZEPAM 15 MG PO CAPS: 15.0000 mg | ORAL_CAPSULE | Freq: Every evening | ORAL | Status: DC | PRN

## 2012-02-12 MED ORDER — ONDANSETRON HCL 4 MG/2ML IJ SOLN
4.0000 mg | Freq: Four times a day (QID) | INTRAMUSCULAR | Status: DC | PRN
  Administered 2012-02-15: 4 mg via INTRAVENOUS

## 2012-02-12 MED ORDER — METHOCARBAMOL 500 MG PO TABS
500.0000 mg | ORAL_TABLET | Freq: Four times a day (QID) | ORAL | Status: DC | PRN
  Administered 2012-02-13 – 2012-02-16 (×8): 500 mg via ORAL
  Filled 2012-02-12 (×8): qty 1

## 2012-02-12 MED ORDER — PROPOFOL 10 MG/ML IV BOLUS
INTRAVENOUS | Status: DC | PRN
  Administered 2012-02-12: 25 mg via INTRAVENOUS

## 2012-02-12 MED ORDER — ACETAMINOPHEN 10 MG/ML IV SOLN
1000.0000 mg | Freq: Once | INTRAVENOUS | Status: AC
  Administered 2012-02-12: 1000 mg via INTRAVENOUS
  Filled 2012-02-12: qty 100

## 2012-02-12 MED ORDER — MORPHINE SULFATE (PF) 1 MG/ML IV SOLN
INTRAVENOUS | Status: DC
  Administered 2012-02-12: 12:00:00 via INTRAVENOUS
  Administered 2012-02-12: 3 mg via INTRAVENOUS
  Administered 2012-02-12: 5 mg via INTRAVENOUS
  Administered 2012-02-13 (×3): 1 mg via INTRAVENOUS

## 2012-02-12 MED ORDER — PHENOL 1.4 % MT LIQD
1.0000 | OROMUCOSAL | Status: DC | PRN
  Filled 2012-02-12: qty 177

## 2012-02-12 MED ORDER — SODIUM CHLORIDE 0.9 % IV SOLN: INTRAVENOUS | Status: DC

## 2012-02-12 MED ORDER — ONDANSETRON HCL 4 MG/2ML IJ SOLN
4.0000 mg | Freq: Four times a day (QID) | INTRAMUSCULAR | Status: DC | PRN
  Filled 2012-02-12: qty 2

## 2012-02-12 MED ORDER — AMLODIPINE BESY-BENAZEPRIL HCL 5-40 MG PO CAPS: 1.0000 | ORAL_CAPSULE | ORAL | Status: DC

## 2012-02-12 MED ORDER — CHLORHEXIDINE GLUCONATE 4 % EX LIQD: 60.0000 mL | Freq: Once | CUTANEOUS | Status: DC

## 2012-02-12 MED ORDER — MENTHOL 3 MG MT LOZG
1.0000 | LOZENGE | OROMUCOSAL | Status: DC | PRN
  Filled 2012-02-12: qty 9

## 2012-02-12 MED ORDER — METOCLOPRAMIDE HCL 5 MG/ML IJ SOLN: 5.0000 mg | Freq: Three times a day (TID) | INTRAMUSCULAR | Status: DC | PRN

## 2012-02-12 MED ORDER — DIPHENHYDRAMINE HCL 50 MG/ML IJ SOLN: 12.5000 mg | Freq: Four times a day (QID) | INTRAMUSCULAR | Status: DC | PRN

## 2012-02-12 MED ORDER — LACTATED RINGERS IV SOLN
INTRAVENOUS | Status: DC | PRN
  Administered 2012-02-12 (×5): via INTRAVENOUS

## 2012-02-12 MED ORDER — LACTATED RINGERS IV SOLN: INTRAVENOUS | Status: DC

## 2012-02-12 MED ORDER — BISACODYL 10 MG RE SUPP: 10.0000 mg | Freq: Every day | RECTAL | Status: DC | PRN

## 2012-02-12 MED ORDER — HYDROMORPHONE HCL PF 1 MG/ML IJ SOLN
0.2500 mg | INTRAMUSCULAR | Status: DC | PRN
  Administered 2012-02-12 (×2): 0.5 mg via INTRAVENOUS

## 2012-02-12 MED ORDER — ACETAMINOPHEN 325 MG PO TABS
650.0000 mg | ORAL_TABLET | Freq: Four times a day (QID) | ORAL | Status: DC | PRN
  Administered 2012-02-16: 650 mg via ORAL
  Filled 2012-02-12: qty 2

## 2012-02-12 MED ORDER — METOCLOPRAMIDE HCL 10 MG PO TABS: 5.0000 mg | ORAL_TABLET | Freq: Three times a day (TID) | ORAL | Status: DC | PRN

## 2012-02-12 MED ORDER — AMLODIPINE BESYLATE 5 MG PO TABS
5.0000 mg | ORAL_TABLET | Freq: Once | ORAL | Status: AC
  Administered 2012-02-12: 5 mg via ORAL
  Filled 2012-02-12 (×2): qty 1

## 2012-02-12 MED ORDER — AMLODIPINE BESYLATE 5 MG PO TABS
5.0000 mg | ORAL_TABLET | Freq: Every day | ORAL | Status: DC
  Administered 2012-02-13 – 2012-02-16 (×4): 5 mg via ORAL
  Filled 2012-02-12 (×4): qty 1

## 2012-02-12 MED ORDER — DIPHENHYDRAMINE HCL 12.5 MG/5ML PO ELIX: 12.5000 mg | ORAL_SOLUTION | ORAL | Status: DC | PRN

## 2012-02-12 MED ORDER — ZOLPIDEM TARTRATE 5 MG PO TABS: 5.0000 mg | ORAL_TABLET | Freq: Every evening | ORAL | Status: DC | PRN

## 2012-02-12 MED ORDER — CEFAZOLIN SODIUM 1-5 GM-% IV SOLN
INTRAVENOUS | Status: DC | PRN
  Administered 2012-02-12: 1 g via INTRAVENOUS

## 2012-02-12 MED ORDER — FENTANYL CITRATE 0.05 MG/ML IJ SOLN
INTRAMUSCULAR | Status: DC | PRN
  Administered 2012-02-12: 50 ug via INTRAVENOUS

## 2012-02-12 MED ORDER — RIVAROXABAN 10 MG PO TABS
10.0000 mg | ORAL_TABLET | Freq: Every day | ORAL | Status: DC
  Administered 2012-02-13 – 2012-02-16 (×4): 10 mg via ORAL
  Filled 2012-02-12 (×4): qty 1

## 2012-02-12 MED ORDER — TRAMADOL HCL 50 MG PO TABS
50.0000 mg | ORAL_TABLET | Freq: Four times a day (QID) | ORAL | Status: DC | PRN
  Administered 2012-02-15 – 2012-02-16 (×3): 50 mg via ORAL
  Filled 2012-02-12 (×3): qty 1

## 2012-02-12 MED ORDER — FLEET ENEMA 7-19 GM/118ML RE ENEM: 1.0000 | ENEMA | Freq: Once | RECTAL | Status: AC | PRN

## 2012-02-12 MED ORDER — BUPIVACAINE HCL 0.75 % IJ SOLN
INTRAMUSCULAR | Status: DC | PRN
  Administered 2012-02-12: 15 mg

## 2012-02-12 MED ORDER — DEXAMETHASONE SODIUM PHOSPHATE 10 MG/ML IJ SOLN
10.0000 mg | Freq: Once | INTRAMUSCULAR | Status: AC
  Administered 2012-02-12: 10 mg via INTRAVENOUS
  Filled 2012-02-12: qty 1

## 2012-02-12 MED ORDER — ATENOLOL 50 MG PO TABS
50.0000 mg | ORAL_TABLET | Freq: Once | ORAL | Status: AC
  Administered 2012-02-12: 50 mg via ORAL
  Filled 2012-02-12 (×2): qty 1

## 2012-02-12 MED ORDER — OXYCODONE HCL 5 MG PO TABS
5.0000 mg | ORAL_TABLET | ORAL | Status: DC | PRN
  Administered 2012-02-13 (×2): 5 mg via ORAL
  Administered 2012-02-13: 10 mg via ORAL
  Administered 2012-02-13 – 2012-02-14 (×2): 5 mg via ORAL
  Administered 2012-02-14: 10 mg via ORAL
  Administered 2012-02-14 (×2): 5 mg via ORAL
  Filled 2012-02-12 (×2): qty 1
  Filled 2012-02-12: qty 2
  Filled 2012-02-12 (×2): qty 1
  Filled 2012-02-12: qty 2
  Filled 2012-02-12 (×2): qty 1

## 2012-02-12 MED ORDER — ONDANSETRON HCL 4 MG PO TABS: 4.0000 mg | ORAL_TABLET | Freq: Four times a day (QID) | ORAL | Status: DC | PRN

## 2012-02-12 MED ORDER — ACETAMINOPHEN 650 MG RE SUPP: 650.0000 mg | Freq: Four times a day (QID) | RECTAL | Status: DC | PRN

## 2012-02-12 MED ORDER — POTASSIUM CHLORIDE IN NACL 20-0.9 MEQ/L-% IV SOLN
INTRAVENOUS | Status: DC
  Administered 2012-02-12: 20:00:00 via INTRAVENOUS
  Filled 2012-02-12 (×4): qty 1000

## 2012-02-12 MED ORDER — DIPHENHYDRAMINE HCL 12.5 MG/5ML PO ELIX
12.5000 mg | ORAL_SOLUTION | Freq: Four times a day (QID) | ORAL | Status: DC | PRN
  Filled 2012-02-12: qty 5

## 2012-02-12 MED ORDER — BUPIVACAINE ON-Q PAIN PUMP (FOR ORDER SET NO CHG)
INJECTION | Status: DC
  Filled 2012-02-12: qty 1

## 2012-02-12 MED ORDER — DOCUSATE SODIUM 100 MG PO CAPS
100.0000 mg | ORAL_CAPSULE | Freq: Two times a day (BID) | ORAL | Status: DC
  Administered 2012-02-13 – 2012-02-15 (×6): 100 mg via ORAL
  Filled 2012-02-12 (×8): qty 1

## 2012-02-12 MED ORDER — MIDAZOLAM HCL 5 MG/5ML IJ SOLN
INTRAMUSCULAR | Status: DC | PRN
  Administered 2012-02-12: 2 mg via INTRAVENOUS

## 2012-02-12 MED ORDER — CEFAZOLIN SODIUM 1-5 GM-% IV SOLN
1.0000 g | Freq: Four times a day (QID) | INTRAVENOUS | Status: AC
  Administered 2012-02-12 – 2012-02-13 (×3): 1 g via INTRAVENOUS
  Filled 2012-02-12 (×3): qty 50

## 2012-02-12 MED ORDER — ACETAMINOPHEN 10 MG/ML IV SOLN
1000.0000 mg | Freq: Four times a day (QID) | INTRAVENOUS | Status: AC
  Administered 2012-02-12 – 2012-02-13 (×4): 1000 mg via INTRAVENOUS
  Filled 2012-02-12 (×4): qty 100

## 2012-02-12 MED ORDER — COLCHICINE 0.6 MG PO TABS
0.6000 mg | ORAL_TABLET | Freq: Every day | ORAL | Status: DC
  Administered 2012-02-13 – 2012-02-16 (×4): 0.6 mg via ORAL
  Filled 2012-02-12 (×4): qty 1

## 2012-02-12 MED ORDER — DORZOLAMIDE HCL-TIMOLOL MAL 2-0.5 % OP SOLN
1.0000 [drp] | Freq: Two times a day (BID) | OPHTHALMIC | Status: DC
  Administered 2012-02-12 – 2012-02-15 (×7): 1 [drp] via OPHTHALMIC
  Filled 2012-02-12: qty 10

## 2012-02-12 MED ORDER — PANTOPRAZOLE SODIUM 40 MG PO TBEC
40.0000 mg | DELAYED_RELEASE_TABLET | Freq: Every day | ORAL | Status: DC
  Administered 2012-02-13 – 2012-02-15 (×3): 40 mg via ORAL
  Filled 2012-02-12 (×3): qty 1

## 2012-02-12 MED ORDER — BUPIVACAINE 0.25 % ON-Q PUMP SINGLE CATH 300ML
INJECTION | Status: DC | PRN
  Administered 2012-02-12: 300 mL

## 2012-02-12 MED ORDER — ATENOLOL 50 MG PO TABS
50.0000 mg | ORAL_TABLET | Freq: Every day | ORAL | Status: DC
  Administered 2012-02-13 – 2012-02-16 (×5): 50 mg via ORAL
  Filled 2012-02-12 (×4): qty 1

## 2012-02-12 MED ORDER — SODIUM CHLORIDE 0.9 % IJ SOLN: 9.0000 mL | INTRAMUSCULAR | Status: DC | PRN

## 2012-02-12 MED ORDER — POLYETHYLENE GLYCOL 3350 17 G PO PACK: 17.0000 g | PACK | Freq: Every day | ORAL | Status: DC | PRN

## 2012-02-12 MED ORDER — BENAZEPRIL HCL 40 MG PO TABS
40.0000 mg | ORAL_TABLET | Freq: Every day | ORAL | Status: DC
  Administered 2012-02-13 – 2012-02-16 (×4): 40 mg via ORAL
  Filled 2012-02-12 (×4): qty 1

## 2012-02-12 MED ORDER — NALOXONE HCL 0.4 MG/ML IJ SOLN: 0.4000 mg | INTRAMUSCULAR | Status: DC | PRN

## 2012-02-12 MED ORDER — SODIUM CHLORIDE 0.9 % IR SOLN
Status: DC | PRN
  Administered 2012-02-12: 1000 mL

## 2012-02-12 MED ORDER — BUPIVACAINE 0.25 % ON-Q PUMP SINGLE CATH 300ML
300.0000 mL | INJECTION | Status: DC
  Filled 2012-02-12: qty 300

## 2012-02-12 MED ORDER — PROPOFOL 10 MG/ML IV EMUL
INTRAVENOUS | Status: DC | PRN
  Administered 2012-02-12: 300 ug/kg/min via INTRAVENOUS

## 2012-02-12 MED ORDER — METHOCARBAMOL 100 MG/ML IJ SOLN
500.0000 mg | Freq: Four times a day (QID) | INTRAVENOUS | Status: DC | PRN
  Administered 2012-02-12 – 2012-02-13 (×3): 500 mg via INTRAVENOUS
  Filled 2012-02-12 (×3): qty 5

## 2012-02-12 MED ORDER — CHLORTHALIDONE 25 MG PO TABS
25.0000 mg | ORAL_TABLET | Freq: Every day | ORAL | Status: DC
  Administered 2012-02-13 – 2012-02-16 (×4): 25 mg via ORAL
  Filled 2012-02-12 (×4): qty 1

## 2012-02-12 SURGICAL SUPPLY — 51 items
BAG SPEC THK2 15X12 ZIP CLS (MISCELLANEOUS) ×1
BAG ZIPLOCK 12X15 (MISCELLANEOUS) ×2 IMPLANT
BANDAGE ELASTIC 6 VELCRO ST LF (GAUZE/BANDAGES/DRESSINGS) ×2 IMPLANT
BANDAGE ESMARK 6X9 LF (GAUZE/BANDAGES/DRESSINGS) ×1 IMPLANT
BLADE SAG 18X100X1.27 (BLADE) ×2 IMPLANT
BLADE SAW SGTL 11.0X1.19X90.0M (BLADE) ×2 IMPLANT
BNDG CMPR 9X6 STRL LF SNTH (GAUZE/BANDAGES/DRESSINGS) ×1
BNDG ESMARK 6X9 LF (GAUZE/BANDAGES/DRESSINGS) ×2
BOWL SMART MIX CTS (DISPOSABLE) ×2 IMPLANT
CATH KIT ON-Q SILVERSOAK 5IN (CATHETERS) ×2 IMPLANT
CEMENT HV SMART SET (Cement) ×4 IMPLANT
CLOTH BEACON ORANGE TIMEOUT ST (SAFETY) ×2 IMPLANT
CUFF TOURN SGL QUICK 34 (TOURNIQUET CUFF) ×1
CUFF TRNQT CYL 34X4X40X1 (TOURNIQUET CUFF) ×1 IMPLANT
DRAPE EXTREMITY T 121X128X90 (DRAPE) ×2 IMPLANT
DRAPE POUCH INSTRU U-SHP 10X18 (DRAPES) ×2 IMPLANT
DRAPE U-SHAPE 47X51 STRL (DRAPES) ×2 IMPLANT
DRSG ADAPTIC 3X8 NADH LF (GAUZE/BANDAGES/DRESSINGS) ×2 IMPLANT
DURAPREP 26ML APPLICATOR (WOUND CARE) ×2 IMPLANT
ELECT REM PT RETURN 9FT ADLT (ELECTROSURGICAL) ×2
ELECTRODE REM PT RTRN 9FT ADLT (ELECTROSURGICAL) ×1 IMPLANT
EVACUATOR 1/8 PVC DRAIN (DRAIN) ×2 IMPLANT
FACESHIELD LNG OPTICON STERILE (SAFETY) ×10 IMPLANT
GLOVE BIO SURGEON STRL SZ7.5 (GLOVE) ×2 IMPLANT
GLOVE BIO SURGEON STRL SZ8 (GLOVE) ×2 IMPLANT
GLOVE BIOGEL PI IND STRL 8 (GLOVE) ×2 IMPLANT
GLOVE BIOGEL PI INDICATOR 8 (GLOVE) ×2
GOWN STRL NON-REIN LRG LVL3 (GOWN DISPOSABLE) ×2 IMPLANT
GOWN STRL REIN XL XLG (GOWN DISPOSABLE) ×2 IMPLANT
HANDPIECE INTERPULSE COAX TIP (DISPOSABLE) ×2
IMMOBILIZER KNEE 20 (SOFTGOODS) ×2
IMMOBILIZER KNEE 20 THIGH 36 (SOFTGOODS) ×1 IMPLANT
KIT BASIN OR (CUSTOM PROCEDURE TRAY) ×2 IMPLANT
MANIFOLD NEPTUNE II (INSTRUMENTS) ×2 IMPLANT
NS IRRIG 1000ML POUR BTL (IV SOLUTION) ×2 IMPLANT
PACK TOTAL JOINT (CUSTOM PROCEDURE TRAY) ×2 IMPLANT
PAD ABD 7.5X8 STRL (GAUZE/BANDAGES/DRESSINGS) ×2 IMPLANT
PADDING CAST COTTON 6X4 STRL (CAST SUPPLIES) ×6 IMPLANT
POSITIONER SURGICAL ARM (MISCELLANEOUS) ×2 IMPLANT
SET HNDPC FAN SPRY TIP SCT (DISPOSABLE) ×1 IMPLANT
SPONGE GAUZE 4X4 12PLY (GAUZE/BANDAGES/DRESSINGS) ×2 IMPLANT
STRIP CLOSURE SKIN 1/2X4 (GAUZE/BANDAGES/DRESSINGS) ×4 IMPLANT
SUCTION FRAZIER 12FR DISP (SUCTIONS) ×2 IMPLANT
SUT MNCRL AB 4-0 PS2 18 (SUTURE) ×2 IMPLANT
SUT PDS AB 1 CT1 27 (SUTURE) ×6 IMPLANT
SUT VIC AB 2-0 CT1 27 (SUTURE) ×3
SUT VIC AB 2-0 CT1 TAPERPNT 27 (SUTURE) ×3 IMPLANT
TOWEL OR 17X26 10 PK STRL BLUE (TOWEL DISPOSABLE) ×4 IMPLANT
TRAY FOLEY CATH 14FRSI W/METER (CATHETERS) ×2 IMPLANT
WATER STERILE IRR 1500ML POUR (IV SOLUTION) ×2 IMPLANT
WRAP KNEE MAXI GEL POST OP (GAUZE/BANDAGES/DRESSINGS) ×4 IMPLANT

## 2012-02-12 NOTE — Transfer of Care (Signed)
Immediate Anesthesia Transfer of Care Note  Patient: Karen Kaiser  Procedure(s) Performed: Procedure(s) (LRB): TOTAL KNEE ARTHROPLASTY (Right)  Patient Location: PACU  Anesthesia Type: Regional and Spinal  Level of Consciousness: awake, alert , oriented and patient cooperative  Airway & Oxygen Therapy: Patient Spontanous Breathing and Patient connected to face mask oxygen  Post-op Assessment: Report given to PACU RN and Post -op Vital signs reviewed and stable  Post vital signs: Reviewed and stable  Complications: No apparent anesthesia complications

## 2012-02-12 NOTE — Progress Notes (Signed)
Utilization review completed.  

## 2012-02-12 NOTE — Plan of Care (Signed)
Problem: Consults Goal: Diagnosis- Total Joint Replacement Outcome: Completed/Met Date Met:  02/12/12 Primary Total Knee     

## 2012-02-12 NOTE — Anesthesia Postprocedure Evaluation (Signed)
  Anesthesia Post-op Note  Patient: Karen Kaiser  Procedure(s) Performed: Procedure(s) (LRB): TOTAL KNEE ARTHROPLASTY (Right)  Patient Location: PACU  Anesthesia Type: Spinal  Level of Consciousness: awake and alert   Airway and Oxygen Therapy: Patient Spontanous Breathing  Post-op Pain: mild  Post-op Assessment: Post-op Vital signs reviewed, Patient's Cardiovascular Status Stable, Respiratory Function Stable, Patent Airway and No signs of Nausea or vomiting  Post-op Vital Signs: stable  Complications: No apparent anesthesia complications

## 2012-02-12 NOTE — Op Note (Signed)
Pre-operative diagnosis- Osteoarthritis  Right knee(s)  Post-operative diagnosis- Osteoarthritis Right knee(s)  Procedure-  Right  Total Knee Arthroplasty  Surgeon- Gus Rankin. Jen Benedict, MD  Assistant- Dimitri Ped, PA-C   Anesthesia-  Spinal EBL-* No blood loss amount entered *  Drains Hemovac  Tourniquet time-  Total Tourniquet Time Documented: Thigh (Right) - 34 minutes   Complications- None  Condition-PACU - hemodynamically stable.   Brief Clinical Note  Karen Kaiser is a 77 y.o. year old female with end stage OA of her right knee with progressively worsening pain and dysfunction. She has constant pain, with activity and at rest and significant functional deficits with difficulties even with ADLs. She has had extensive non-op management including analgesics, injections of cortisone and viscosupplements, and home exercise program, but remains in significant pain with significant dysfunction.Radiographs show bone on bone arthritis lateral and patellofemoral with tibial subluxation. She presents now for left Total Knee Arthroplasty.    Procedure in detail---   The patient is brought into the operating room and positioned supine on the operating table. After successful administration of  Spinal,   a tourniquet is placed high on the  Right thigh(s) and the lower extremity is prepped and draped in the usual sterile fashion. Time out is performed by the operating team and then the  Right lower extremity is wrapped in Esmarch, knee flexed and the tourniquet inflated to 300 mmHg.       A midline incision is made with a ten blade through the subcutaneous tissue to the level of the extensor mechanism. A fresh blade is used to make a medial parapatellar arthrotomy. Soft tissue over the proximal medial tibia is subperiosteally elevated to the joint line with a knife and into the semimembranosus bursa with a Cobb elevator. Soft tissue over the proximal lateral tibia is elevated with attention  being paid to avoiding the patellar tendon on the tibial tubercle. The patella is everted, knee flexed 90 degrees and the ACL and PCL are removed. Findings are bone on bone all 3 compartments with significant hypertrophic synovitis.        The drill is used to create a starting hole in the distal femur and the canal is thoroughly irrigated with sterile saline to remove the fatty contents. The 5 degree Right  valgus alignment guide is placed into the femoral canal and the distal femoral cutting block is pinned to remove 10 mm off the distal femur. Resection is made with an oscillating saw.      The tibia is subluxed forward and the menisci are removed. The extramedullary alignment guide is placed referencing proximally at the medial aspect of the tibial tubercle and distally along the second metatarsal axis and tibial crest. The block is pinned to remove 2mm off the more deficient lateral  side. Resection is made with an oscillating saw. Size 2.5is the most appropriate size for the tibia and the proximal tibia is prepared with the modular drill and keel punch for that size.      The femoral sizing guide is placed and size 2.5 is most appropriate. Rotation is marked off the epicondylar axis and confirmed by creating a rectangular flexion gap at 90 degrees. The size 2.5 cutting block is pinned in this rotation and the anterior, posterior and chamfer cuts are made with the oscillating saw. The intercondylar block is then placed and that cut is made.      Trial size 2.5 tibial component, trial size 2.5 posterior stabilized femur and a  10  mm posterior stabilized rotating platform insert trial is placed. Full extension is achieved with excellent varus/valgus and anterior/posterior balance throughout full range of motion. The patella is everted and thickness measured to be 22  mm. Free hand resection is taken to 12 mm, a 35 template is placed, lug holes are drilled, trial patella is placed, and it tracks normally.  Osteophytes are removed off the posterior femur with the trial in place. All trials are removed and the cut bone surfaces prepared with pulsatile lavage. Cement is mixed and once ready for implantation, the size 2.5 tibial implant, size  2.5 posterior stabilized femoral component, and the size 35 patella are cemented in place and the patella is held with the clamp. The trial insert is placed and the knee held in full extension. All extruded cement is removed and once the cement is hard the permanent 10 mm posterior stabilized rotating platform insert is placed into the tibial tray.      The wound is copiously irrigated with saline solution and the extensor mechanism closed over a hemovac drain with #1 PDS suture. The tourniquet is released for a total tourniquet time of 33  minutes. Flexion against gravity is 140 degrees and the patella tracks normally. Subcutaneous tissue is closed with 2.0 vicryl and subcuticular with running 4.0 Monocryl. The catheter for the Marcaine pain pump is placed and the pump is initiated. The incision is cleaned and dried and steri-strips and a bulky sterile dressing are applied. The limb is placed into a knee immobilizer and the patient is awakened and transported to recovery in stable condition.      Please note that a surgical assistant was a medical necessity for this procedure in order to perform it in a safe and expeditious manner. Surgical assistant was necessary to retract the ligaments and vital neurovascular structures to prevent injury to them and also necessary for proper positioning of the limb to allow for anatomic placement of the prosthesis.   Gus Rankin Karen Sauve, MD    02/12/2012, 11:17 AM

## 2012-02-12 NOTE — Anesthesia Preprocedure Evaluation (Addendum)
Anesthesia Evaluation  Patient identified by MRN, date of birth, ID band Patient awake    Reviewed: Allergy & Precautions, H&P , NPO status , Patient's Chart, lab work & pertinent test results, reviewed documented beta blocker date and time   History of Anesthesia Complications (+) PONV  Airway Mallampati: II TM Distance: >3 FB Neck ROM: full    Dental No notable dental hx. (+) Teeth Intact and Dental Advisory Given   Pulmonary Recent URI ,  breath sounds clear to auscultation  Pulmonary exam normal       Cardiovascular Exercise Tolerance: Good hypertension, Pt. on medications and Pt. on home beta blockers Rhythm:regular Rate:Normal     Neuro/Psych negative neurological ROS  negative psych ROS   GI/Hepatic negative GI ROS, Neg liver ROS, GERD-  Medicated and Controlled,  Endo/Other  negative endocrine ROS  Renal/GU negative Renal ROS  negative genitourinary   Musculoskeletal   Abdominal   Peds  Hematology negative hematology ROS (+)   Anesthesia Other Findings   Reproductive/Obstetrics negative OB ROS                          Anesthesia Physical Anesthesia Plan  ASA: II  Anesthesia Plan: Spinal   Post-op Pain Management:    Induction:   Airway Management Planned:   Additional Equipment:   Intra-op Plan:   Post-operative Plan:   Informed Consent: I have reviewed the patients History and Physical, chart, labs and discussed the procedure including the risks, benefits and alternatives for the proposed anesthesia with the patient or authorized representative who has indicated his/her understanding and acceptance.   Dental Advisory Given  Plan Discussed with: CRNA and Surgeon  Anesthesia Plan Comments:         Anesthesia Quick Evaluation

## 2012-02-12 NOTE — Anesthesia Procedure Notes (Addendum)
Spinal   Spinal  Patient location during procedure: OR Start time: 02/12/2012 10:15 AM End time: 02/12/2012 10:20 AM Staffing Anesthesiologist: Ronelle Nigh L Performed by: anesthesiologist  Preanesthetic Checklist Completed: patient identified, site marked, surgical consent, pre-op evaluation, timeout performed, IV checked, risks and benefits discussed and monitors and equipment checked Spinal Block Patient position: sitting Prep: Betadine Patient monitoring: heart rate, continuous pulse ox and blood pressure Location: L3-4 Injection technique: single-shot Needle Needle type: Spinocan  Needle gauge: 22 G Needle length: 9 cm Assessment Sensory level: T6 Additional Notes Expiration date of kit checked and confirmed. Patient tolerated procedure well, without complications.

## 2012-02-13 LAB — CBC
HCT: 31.6 % — ABNORMAL LOW (ref 36.0–46.0)
Hemoglobin: 10.5 g/dL — ABNORMAL LOW (ref 12.0–15.0)
MCV: 92.4 fL (ref 78.0–100.0)
RDW: 13.6 % (ref 11.5–15.5)
WBC: 13.8 10*3/uL — ABNORMAL HIGH (ref 4.0–10.5)

## 2012-02-13 LAB — BASIC METABOLIC PANEL
BUN: 8 mg/dL (ref 6–23)
CO2: 27 mEq/L (ref 19–32)
Chloride: 96 mEq/L (ref 96–112)
Creatinine, Ser: 0.53 mg/dL (ref 0.50–1.10)
GFR calc Af Amer: >90 mL/min (ref >90)
Glucose, Bld: 186 mg/dL — ABNORMAL HIGH (ref 70–99)

## 2012-02-13 MED ORDER — POTASSIUM CHLORIDE CRYS ER 20 MEQ PO TBCR
40.0000 meq | EXTENDED_RELEASE_TABLET | Freq: Two times a day (BID) | ORAL | Status: AC
  Administered 2012-02-13 – 2012-02-14 (×3): 40 meq via ORAL
  Filled 2012-02-13 (×4): qty 2

## 2012-02-13 MED ORDER — MORPHINE SULFATE 2 MG/ML IJ SOLN
1.0000 mg | INTRAMUSCULAR | Status: DC | PRN
Start: 2012-02-13 — End: 2012-02-16
  Administered 2012-02-14: 2 mg via INTRAVENOUS
  Filled 2012-02-13: qty 1

## 2012-02-13 NOTE — Progress Notes (Signed)
Physical Therapy Treatment Patient Details Name: Karen Kaiser MRN: [id number] DOB: 1935-02-25 Today's Date: 02/13/2012  PT Assessment/Plan  PT - Assessment/Plan PT Plan: Discharge plan remains appropriate PT Frequency: 7X/week Recommendations for Other Services: OT consult Follow Up Recommendations: Home health PT Equipment Recommended: Rolling walker with 5" wheels PT Goals  Acute Rehab PT Goals PT Goal Formulation: With patient Time For Goal Achievement: 7 days Pt will go Supine/Side to Sit: with supervision PT Goal: Supine/Side to Sit - Progress: Goal set today Pt will go Sit to Supine/Side: with supervision PT Goal: Sit to Supine/Side - Progress: Goal set today Pt will go Sit to Stand: with supervision PT Goal: Sit to Stand - Progress: Goal set today Pt will go Stand to Sit: with supervision PT Goal: Stand to Sit - Progress: Goal set today Pt will Ambulate: 51 - 150 feet;with supervision;with rolling walker PT Goal: Ambulate - Progress: Goal set today Pt will Go Up / Down Stairs: 3-5 stairs PT Goal: Up/Down Stairs - Progress: Goal set today  PT Treatment Precautions/Restrictions  Precautions Precautions: Knee Required Braces or Orthoses: Yes Knee Immobilizer: Discontinue once straight leg raise with < 10 degree lag Restrictions Weight Bearing Restrictions: No Other Position/Activity Restrictions: WBAT Mobility (including Balance) Bed Mobility Sit to Supine: 3: Mod assist Sit to Supine - Details (indicate cue type and reason): cues for sequence and use of UEs to self assist Transfers Sit to Stand: 3: Mod assist;4: Min assist Sit to Stand Details (indicate cue type and reason): cues for use of UEs and for LE position Stand to Sit: 3: Mod assist;4: Min assist Stand to Sit Details: cues for use of UEs and for LE position Ambulation/Gait Ambulation/Gait Assistance: 3: Mod assist Ambulation/Gait Assistance Details (indicate cue type and reason): cues for sequence,  posture and position from RW Ambulation Distance (Feet): 48 Feet Assistive device: Rolling walker Gait Pattern: Step-to pattern    Exercise    End of Session PT - End of Session Equipment Utilized During Treatment: Right knee immobilizer Activity Tolerance: Patient tolerated treatment well Patient left: in bed;with call bell in reach;with family/visitor present Nurse Communication: Mobility status for transfers;Mobility status for ambulation General Behavior During Session: [facility] for tasks performed Cognition: [facility] for tasks performed  Bryelle Spiewak 02/13/2012, 3:47 PM

## 2012-02-13 NOTE — Evaluation (Signed)
Physical Therapy Evaluation Patient Details Name: Karen Kaiser MRN: [id number] DOB: 1935/10/06 Today's Date: 02/13/2012  Problem List:  Patient Active Problem List  Diagnoses  . OA (osteoarthritis) of knee    Past Medical History:  Past Medical History  Diagnosis Date  . PONV (postoperative nausea and vomiting)   . Hypertension   . Heart murmur     as a child   . Peripheral vascular disease     varciose veins in right leg   . Recurrent upper respiratory infection (URI)     sinus infection 1/13 2/13 for tx   . GERD (gastroesophageal reflux disease)   . Chronic kidney disease     urgency and stress incontinence   . Arthritis     generalized arthrtis   . Cancer     breast mastectomy - 1996 lung ca 2004- RLL  . Glaucoma    Past Surgical History:  Past Surgical History  Procedure Date  . Mastectomy     right   . Lobectomy     RLL   . Other surgical history     right rotator cuff surgery   . Eye surgery     bilateral cataract srugery   . Breast biopsy     left breast biopsy 2006   . Dilation and curettage of uterus     PT Assessment/Plan/Recommendation PT Assessment Clinical Impression Statement: Pt with R TKR presents with decreased R LE strength/ROM and ltd functional mobility PT Recommendation/Assessment: Patient will need skilled PT in the acute care venue PT Problem List: Decreased strength;Decreased range of motion;Decreased activity tolerance;Decreased mobility;Decreased knowledge of use of DME;Pain PT Therapy Diagnosis : Difficulty walking PT Plan PT Frequency: 7X/week PT Treatment/Interventions: DME instruction;Gait training;Stair training;Functional mobility training;Therapeutic activities;Therapeutic exercise;Patient/family education PT Recommendation Recommendations for Other Services: OT consult Follow Up Recommendations: Home health PT Equipment Recommended: Rolling walker with 5" wheels PT Goals  Acute Rehab PT Goals PT Goal Formulation: With  patient Time For Goal Achievement: 7 days Pt will go Supine/Side to Sit: with supervision PT Goal: Supine/Side to Sit - Progress: Goal set today Pt will go Sit to Supine/Side: with supervision PT Goal: Sit to Supine/Side - Progress: Goal set today Pt will go Sit to Stand: with supervision PT Goal: Sit to Stand - Progress: Goal set today Pt will go Stand to Sit: with supervision PT Goal: Stand to Sit - Progress: Goal set today Pt will Ambulate: 51 - 150 feet;with supervision;with rolling walker PT Goal: Ambulate - Progress: Goal set today Pt will Go Up / Down Stairs: 3-5 stairs (4 stairs with rail and one set without rail) PT Goal: Up/Down Stairs - Progress: Goal set today  PT Evaluation Precautions/Restrictions  Precautions Precautions: Knee Required Braces or Orthoses: Yes Knee Immobilizer: Discontinue once straight leg raise with < 10 degree lag Restrictions Weight Bearing Restrictions: No Other Position/Activity Restrictions: WBAT Prior Functioning  Home Living Lives With: Spouse Receives Help From: Family Type of Home: House Home Layout: One level Home Access: Stairs to enter Entrance Stairs-Rails: Left Entrance Stairs-Number of Steps: 4 (4 +1 STAIRS) Prior Function Level of Independence: Independent with basic ADLs;Independent with gait;Independent with transfers Able to Take Stairs?: Yes Cognition Cognition Arousal/Alertness: Awake/alert Overall Cognitive Status: Appears within functional limits for tasks assessed Orientation Level: Oriented X4 Sensation/Coordination Coordination Gross Motor Movements are Fluid and Coordinated: Yes Extremity Assessment RUE Assessment RUE Assessment: Within Functional Limits LUE Assessment LUE Assessment: Within Functional Limits RLE Assessment RLE Assessment: Exceptions to [facility] (knee  AAROM -10 - 40; 2/5 quads) LLE Assessment LLE Assessment: Within Functional Limits Mobility (including Balance) Bed Mobility Bed Mobility:  Yes Supine to Sit: 3: Mod assist Supine to Sit Details (indicate cue type and reason): cues for sequence and use of UEs to self assist Transfers Transfers: Yes Sit to Stand: 3: Mod assist Sit to Stand Details (indicate cue type and reason): cues for use of UEs and for LE position Stand to Sit: 3: Mod assist Stand to Sit Details: cues for use of UEs and for LE position Ambulation/Gait Ambulation/Gait: Yes Ambulation/Gait Assistance: 3: Mod assist Ambulation/Gait Assistance Details (indicate cue type and reason): cues for sequence, posture and position from RW Ambulation Distance (Feet): 17 Feet Assistive device: Rolling walker Gait Pattern: Step-to pattern    Exercise  Total Joint Exercises Ankle Circles/Pumps: AROM;10 reps;Supine;Both Quad Sets: AROM;Both;10 reps;Supine Heel Slides: AAROM;10 reps;Supine;Right Straight Leg Raises: AAROM;Right;10 reps;Supine End of Session PT - End of Session Equipment Utilized During Treatment: Right knee immobilizer Activity Tolerance: Patient tolerated treatment well Patient left: in chair;with call bell in reach Nurse Communication: Mobility status for transfers;Mobility status for ambulation General Behavior During Session: [facility] for tasks performed Cognition: [facility] for tasks performed  Marsia Cino 02/13/2012, 12:56 PM

## 2012-02-13 NOTE — Progress Notes (Signed)
Subjective: 1 Day Post-Op Procedure(s) (LRB): TOTAL KNEE ARTHROPLASTY (Right) Patient reports pain as mild and moderate.   Patient seen in rounds with Dr. Lequita Halt. Patient has complaints of soreness. Family in room with patient. We will start therapy today. Plan is to go home after hospital stay.  Objective: Vital signs in last 24 hours: Temp:  [97.4 F (36.3 C)-98.6 F (37 C)] 98.2 F (36.8 C) (03/26 0538) Pulse Rate:  [64-80] 64  (03/26 0538) Resp:  [10-19] 16  (03/26 0538) BP: (115-159)/(60-92) 127/67 mmHg (03/26 0538) SpO2:  [96 %-100 %] 99 % (03/26 0538) FiO2 (%):  [98 %] 98 % (03/25 1310) Weight:  [55.792 kg (123 lb)] 55.792 kg (123 lb) (03/25 1310)  Intake/Output from previous day:  Intake/Output Summary (Last 24 hours) at 02/13/12 0824 Last data filed at 02/13/12 0537  Gross per 24 hour  Intake   5325 ml  Output   4335 ml  Net    990 ml    Intake/Output this shift:    Labs:  Basename 02/13/12 0410  HGB 10.5*    Basename 02/13/12 0410  WBC 13.8*  RBC 3.42*  HCT 31.6*  PLT 376    Basename 02/13/12 0410  NA 132*  K 3.2*  CL 96  CO2 27  BUN 8  CREATININE 0.53  GLUCOSE 186*  CALCIUM 8.9   No results found for this basename: LABPT:2,INR:2 in the last 72 hours  Exam - Neurovascular intact Sensation intact distally Dressing - clean, dry, no drainage Motor function intact - moving foot and toes well on exam.  Hemovac pulled without difficulty.  Past Medical History  Diagnosis Date  . PONV (postoperative nausea and vomiting)   . Hypertension   . Heart murmur     as a child   . Peripheral vascular disease     varciose veins in right leg   . Recurrent upper respiratory infection (URI)     sinus infection 1/13 2/13 for tx   . GERD (gastroesophageal reflux disease)   . Chronic kidney disease     urgency and stress incontinence   . Arthritis     generalized arthrtis   . Cancer     breast mastectomy - 1996 lung ca 2004- RLL  . Glaucoma      Assessment/Plan: 1 Day Post-Op Procedure(s) (LRB): TOTAL KNEE ARTHROPLASTY (Right) Principal Problem:  *OA (osteoarthritis) of knee   Advance diet Up with therapy Continue foley due to strict I&O and urinary output monitoring Discharge home with home health  DVT Prophylaxis - Xarelto Protocol Weight-Bearing as tolerated to right leg Keep foley until tomorrow. No vaccines. D/C PCA, Change to IV push D/C O2 and Pulse OX and try on Room Air  PERKINS, Marlowe Sax 02/13/2012, 8:24 AM

## 2012-02-14 LAB — BASIC METABOLIC PANEL
BUN: 9 mg/dL (ref 6–23)
Calcium: 9.2 mg/dL (ref 8.4–10.5)
Creatinine, Ser: 0.61 mg/dL (ref 0.50–1.10)
GFR calc Af Amer: >90 mL/min (ref >90)
GFR calc non Af Amer: 87 mL/min — ABNORMAL LOW (ref >90)
Glucose, Bld: 113 mg/dL — ABNORMAL HIGH (ref 70–99)

## 2012-02-14 LAB — CBC
HCT: 33 % — ABNORMAL LOW (ref 36.0–46.0)
Hemoglobin: 10.9 g/dL — ABNORMAL LOW (ref 12.0–15.0)
MCH: 30.6 pg (ref 26.0–34.0)
MCHC: 33 g/dL (ref 30.0–36.0)
MCV: 92.7 fL (ref 78.0–100.0)
RDW: 14 % (ref 11.5–15.5)

## 2012-02-14 NOTE — Progress Notes (Signed)
Physical Therapy Treatment Patient Details Name: DEMARI KROPP MRN: [id number] DOB: [birth year] Today's Date: 02/14/2012  PT Assessment/Plan  PT - Assessment/Plan Comments on Treatment Session: Pt limited by pain and increased anxiety level.  RN aware PT Plan: Discharge plan remains appropriate PT Frequency: 7X/week Recommendations for Other Services: OT consult Follow Up Recommendations: Home health PT Equipment Recommended: Rolling walker with 5" wheels PT Goals  Acute Rehab PT Goals PT Goal Formulation: With patient Time For Goal Achievement: 7 days Pt will go Supine/Side to Sit: with supervision PT Goal: Supine/Side to Sit - Progress: Not progressing Pt will go Sit to Supine/Side: with supervision PT Goal: Sit to Supine/Side - Progress: Not progressing Pt will go Sit to Stand: with supervision PT Goal: Sit to Stand - Progress: Not progressing Pt will go Stand to Sit: with supervision PT Goal: Stand to Sit - Progress: Not progressing Pt will Ambulate: 51 - 150 feet;with supervision;with rolling walker PT Goal: Ambulate - Progress: Not progressing  PT Treatment Precautions/Restrictions  Precautions Precautions: Knee Required Braces or Orthoses: Yes Knee Immobilizer: Discontinue once straight leg raise with < 10 degree lag Restrictions Weight Bearing Restrictions: Yes RLE Weight Bearing: Weight bearing as tolerated Other Position/Activity Restrictions: WBAT Mobility (including Balance) Bed Mobility Supine to Sit: 3: Mod assist Supine to Sit Details (indicate cue type and reason): cues for sequence and use of UEs to self assist Transfers Sit to Stand: 3: Mod assist;From bed;With armrests;From chair/3-in-1 Sit to Stand Details (indicate cue type and reason): cues for use of UEs and for LE position Stand to Sit: 3: Mod assist;To chair/3-in-1;With armrests;With upper extremity assist Stand to Sit Details: cues for use of UEs and for LE position Stand Pivot Transfers: 3:  Mod assist Stand Pivot Transfer Details (indicate cue type and reason): with RW bed to [facility] with cues for sequence and position from RW Ambulation/Gait Ambulation/Gait Assistance: 3: Mod assist Ambulation/Gait Assistance Details (indicate cue type and reason): cues for sequence, posture and position from RW Ambulation Distance (Feet): 42 Feet Assistive device: Rolling walker Gait Pattern: Step-to pattern    Exercise    End of Session PT - End of Session Equipment Utilized During Treatment: Right knee immobilizer Activity Tolerance: Patient limited by pain;Patient limited by fatigue Patient left: in chair;with call bell in reach;with family/visitor present Nurse Communication: Mobility status for transfers;Mobility status for ambulation General Behavior During Session: [facility] for tasks performed Cognition: [facility] for tasks performed  Arliss Hepburn 02/14/2012, 4:02 PM

## 2012-02-14 NOTE — Progress Notes (Signed)
OT Cancellation Note  _x__Treatment cancelled today due to medical issues with patient which prohibited therapy. Pt nauseated, not feeling well. Will check back 02/15/12.  ___ Treatment cancelled today due to patient receiving procedure or test   ___ Treatment cancelled today due to patient's refusal to participate   ___ Treatment cancelled today due to   Signature: Garrel Ridgel, OTR/L  Pager [phone] 02/14/2012

## 2012-02-14 NOTE — Progress Notes (Signed)
CARE MANAGEMENT NOTE 02/14/2012  Patient:  Karen Kaiser, Karen Kaiser   Account Number:  [id number]  Date Initiated:  02/13/2012  Documentation initiated by:  Colleen Can  Subjective/Objective Assessment:   dx osteoarthritis right knee     Action/Plan:   Home with spouse as caregiver and [facility] services. Pt is requesting [facility] [employer] services. States she will need rolling walker. Has not decided if she needs 3n1.already has high toliet seat with bar & shower chair/ list of hh agencies in [place]   Anticipated DC Date:  02/15/2012   Anticipated DC Plan:  HOME W HOME HEALTH SERVICES  In-house referral  Clinical Social Worker      DC Planning Services  CM consult      [facility] Choice  HOME HEALTH   Choice offered to / List presented to:  C-1 Patient        HH arranged  HH-2 PT      [facility] agency  [employer]   Status of service:  In process, will continue to follow

## 2012-02-14 NOTE — Progress Notes (Signed)
Subjective: 2 Days Post-Op Procedure(s) (LRB): TOTAL KNEE ARTHROPLASTY (Right) Patient reports pain as mild and moderate.   Patient has complaints of more pain today.  Did fair amount of therapy yesterday and increased pain after moving in the bed yesterday.  Objective: Vital signs in last 24 hours: Temp:  [97.8 F (36.6 C)-98.4 F (36.9 C)] 97.8 F (36.6 C) (03/27 1432) Pulse Rate:  [62-80] 62  (03/27 1432) Resp:  [16-18] 18  (03/27 1432) BP: (128-158)/(74-85) 148/81 mmHg (03/27 1432) SpO2:  [95 %-99 %] 99 % (03/27 1432)  Intake/Output from previous day:  Intake/Output Summary (Last 24 hours) at 02/14/12 1506 Last data filed at 02/14/12 1300  Gross per 24 hour  Intake 1518.17 ml  Output   3625 ml  Net -2106.83 ml    Intake/Output this shift: Total I/O In: 600 [P.O.:600] Out: 900 [Urine:900]  Labs:  [facility] 02/14/12 0425 02/13/12 0410  HGB 10.9* 10.5*    Basename 02/14/12 0425 02/13/12 0410  WBC 15.8* 13.8*  RBC 3.56* 3.42*  HCT 33.0* 31.6*  PLT 410* 376    Basename 02/14/12 0425 02/13/12 0410  NA 133* 132*  K 4.3 3.2*  CL 97 96  CO2 28 27  BUN 9 8  CREATININE 0.61 0.53  GLUCOSE 113* 186*  CALCIUM 9.2 8.9   No results found for this basename: LABPT:2,INR:2 in the last 72 hours  Exam - Neurovascular intact Sensation intact distally Dressing/Incision - clean, dry, no drainage Motor function intact - moving foot and toes well on exam.   Past Medical History  Diagnosis Date  . PONV (postoperative nausea and vomiting)   . Hypertension   . Heart murmur     as a child   . Peripheral vascular disease     varciose veins in right leg   . Recurrent upper respiratory infection (URI)     sinus infection 1/13 2/13 for tx   . GERD (gastroesophageal reflux disease)   . Chronic kidney disease     urgency and stress incontinence   . Arthritis     generalized arthrtis   . Cancer     breast mastectomy - 1996 lung ca 2004- RLL  . Glaucoma      Assessment/Plan: 2 Days Post-Op Procedure(s) (LRB): TOTAL KNEE ARTHROPLASTY (Right) Principal Problem:  *OA (osteoarthritis) of knee   Advance diet Up with therapy Plan for discharge tomorrow Discharge home with home health  DVT Prophylaxis - Xarelto Protocol Weight-Bearing as tolerated to right leg  ,  02/14/2012, 3:06 PM

## 2012-02-14 NOTE — Progress Notes (Signed)
Physical Therapy Treatment Patient Details Name: Karen Kaiser MRN: [id number] DOB: 16-Jul-1935 Today's Date: 02/14/2012  PT Assessment/Plan  PT - Assessment/Plan Comments on Treatment Session: Pt limited by pain and increased anxiety level.  RN aware PT Plan: Discharge plan remains appropriate PT Frequency: 7X/week Recommendations for Other Services: OT consult Follow Up Recommendations: Home health PT Equipment Recommended: Rolling walker with 5" wheels PT Goals  Acute Rehab PT Goals PT Goal Formulation: With patient Time For Goal Achievement: 7 days Pt will go Supine/Side to Sit: with supervision PT Goal: Supine/Side to Sit - Progress: Not progressing Pt will go Sit to Supine/Side: with supervision PT Goal: Sit to Supine/Side - Progress: Progressing toward goal Pt will go Sit to Stand: with supervision PT Goal: Sit to Stand - Progress: Progressing toward goal Pt will go Stand to Sit: with supervision PT Goal: Stand to Sit - Progress: Progressing toward goal Pt will Ambulate: 51 - 150 feet;with supervision;with rolling walker PT Goal: Ambulate - Progress: Progressing toward goal  PT Treatment Precautions/Restrictions  Precautions Precautions: Knee Required Braces or Orthoses: Yes Knee Immobilizer: Discontinue once straight leg raise with < 10 degree lag Restrictions Weight Bearing Restrictions: Yes RLE Weight Bearing: Weight bearing as tolerated Other Position/Activity Restrictions: WBAT Mobility (including Balance) Bed Mobility Supine to Sit: 3: Mod assist Supine to Sit Details (indicate cue type and reason): cues for sequence and use of UEs to self assist Sit to Supine: 3: Mod assist;4: Min assist Sit to Supine - Details (indicate cue type and reason): cues for sequence and self assist with L LE and UEs Transfers Sit to Stand: 4: Min assist;With upper extremity assist;From chair/3-in-1 Sit to Stand Details (indicate cue type and reason): cues for use of UEs and for  LE management Stand to Sit: 4: Min assist;To bed;With upper extremity assist Stand to Sit Details: cues for use of UEs and for LE management Stand Pivot Transfers: 3: Mod assist Stand Pivot Transfer Details (indicate cue type and reason): with RW bed to [facility] with cues for sequence and position from RW Ambulation/Gait Ambulation/Gait Assistance: 4: Min assist Ambulation/Gait Assistance Details (indicate cue type and reason): cues for sequence, posture and position from RW Ambulation Distance (Feet): 50 Feet Assistive device: Rolling walker Gait Pattern: Step-to pattern    Exercise    End of Session PT - End of Session Equipment Utilized During Treatment: Right knee immobilizer Activity Tolerance: Patient limited by pain;Patient limited by fatigue Patient left: in bed;with call bell in reach;with family/visitor present Nurse Communication: Mobility status for transfers;Mobility status for ambulation General Behavior During Session: [facility] for tasks performed Cognition: [facility] for tasks performed  Karen Kaiser 02/14/2012, 4:06 PM

## 2012-02-15 LAB — CBC
MCH: 30.7 pg (ref 26.0–34.0)
MCHC: 32.9 g/dL (ref 30.0–36.0)
RDW: 13.9 % (ref 11.5–15.5)

## 2012-02-15 LAB — BASIC METABOLIC PANEL
Calcium: 9.3 mg/dL (ref 8.4–10.5)
GFR calc Af Amer: 81 mL/min — ABNORMAL LOW (ref >90)
GFR calc non Af Amer: 70 mL/min — ABNORMAL LOW (ref >90)
Glucose, Bld: 133 mg/dL — ABNORMAL HIGH (ref 70–99)
Potassium: 3.6 mEq/L (ref 3.5–5.1)
Sodium: 133 mEq/L — ABNORMAL LOW (ref 135–145)

## 2012-02-15 MED ORDER — RIVAROXABAN 10 MG PO TABS: 10.0000 mg | ORAL_TABLET | Freq: Every day | ORAL | Status: DC

## 2012-02-15 MED ORDER — OXYCODONE HCL 5 MG PO TABS: 5.0000 mg | ORAL_TABLET | ORAL | Status: AC | PRN

## 2012-02-15 MED ORDER — SODIUM CHLORIDE 0.9 % IV SOLN: INTRAVENOUS | Status: DC

## 2012-02-15 MED ORDER — SODIUM CHLORIDE 0.9 % IV SOLN
Freq: Once | INTRAVENOUS | Status: AC
  Administered 2012-02-15: 500 mL/h via INTRAVENOUS

## 2012-02-15 MED ORDER — TRAMADOL HCL 50 MG PO TABS: 50.0000 mg | ORAL_TABLET | Freq: Four times a day (QID) | ORAL | Status: AC | PRN

## 2012-02-15 MED ORDER — METHOCARBAMOL 500 MG PO TABS: 500.0000 mg | ORAL_TABLET | Freq: Four times a day (QID) | ORAL | Status: AC | PRN

## 2012-02-15 MED ORDER — SODIUM CHLORIDE 0.9 % IV SOLN
INTRAVENOUS | Status: DC
  Administered 2012-02-15: 125 mL/h via INTRAVENOUS

## 2012-02-15 NOTE — Progress Notes (Signed)
Physical Therapy Treatment Patient Details Name: Karen Kaiser MRN: [id number] DOB: 1935/01/17 Today's Date: 02/15/2012  PT Assessment/Plan  PT - Assessment/Plan Comments on Treatment Session: Pt ltd by fatigue and orthostatic hypotension....curently receiving bolus of fluids PT Plan: Discharge plan remains appropriate PT Frequency: 7X/week Recommendations for Other Services: OT consult Follow Up Recommendations: Home health PT Equipment Recommended: 3 in 1 bedside comode PT Goals  Acute Rehab PT Goals PT Goal Formulation: With patient Time For Goal Achievement: 7 days Pt will go Supine/Side to Sit: with supervision PT Goal: Supine/Side to Sit - Progress: Progressing toward goal Pt will go Sit to Supine/Side: with supervision PT Goal: Sit to Supine/Side - Progress: Progressing toward goal Pt will go Sit to Stand: with supervision PT Goal: Sit to Stand - Progress: Progressing toward goal Pt will go Stand to Sit: with supervision PT Goal: Stand to Sit - Progress: Progressing toward goal Pt will Ambulate: 51 - 150 feet;with supervision;with rolling walker PT Goal: Ambulate - Progress: Progressing toward goal Pt will Go Up / Down Stairs: 3-5 stairs PT Goal: Up/Down Stairs - Progress: Progressing toward goal  PT Treatment Precautions/Restrictions  Precautions Precautions: Knee Required Braces or Orthoses: Yes Knee Immobilizer: Discontinue once straight leg raise with < 10 degree lag Restrictions Weight Bearing Restrictions: No RLE Weight Bearing: Weight bearing as tolerated Other Position/Activity Restrictions: WBAT Mobility (including Balance) Bed Mobility Sit to Supine: 4: Min assist Sit to Supine - Details (indicate cue type and reason): min cues, min assist with R LE Transfers Sit to Stand: 4: Min assist;5: Supervision;With armrests;From chair/3-in-1;With upper extremity assist Sit to Stand Details (indicate cue type and reason): CGA  Stand to Sit: 5: Supervision;4:  Min assist;To bed;To chair/3-in-1;With armrests;With upper extremity assist Stand to Sit Details: CGA Ambulation/Gait Ambulation/Gait Assistance: 4: Min assist;5: Supervision Ambulation/Gait Assistance Details (indicate cue type and reason): cues for sequence and position from RW Ambulation Distance (Feet): 6 Feet Assistive device: Rolling walker Gait Pattern: Step-to pattern    Exercise  Total Joint Exercises Ankle Circles/Pumps: AROM;20 reps;Supine;Both Quad Sets: AROM;15 reps;Supine;Both Heel Slides: AAROM;20 reps;Right;Supine Straight Leg Raises: AAROM;20 reps;Right;Supine End of Session PT - End of Session Equipment Utilized During Treatment: Right knee immobilizer Activity Tolerance: Treatment limited secondary to medical complications (Comment) (Pt orthostatic with RN measuring BP) Patient left: in bed;with call bell in reach Nurse Communication: Mobility status for transfers;Mobility status for ambulation General Behavior During Session: [facility] for tasks performed Cognition: [facility] for tasks performed  Valree Feild 02/15/2012, 5:09 PM

## 2012-02-15 NOTE — Discharge Instructions (Signed)
Knee Rehabilitation, Guidelines Following Surgery Results after knee surgery are often greatly improved when you follow the exercise, range of motion and muscle strengthening exercises prescribed by your doctor. Safety measures are also important to protect the knee from further injury. Any time any of these exercises cause you to have increased pain or swelling in your knee joint, decrease the amount until you are comfortable again and slowly increase them. If you have problems or questions, call your caregiver or physical therapist for advice. HOME CARE INSTRUCTIONS   Remove items at home which could result in a fall. This includes throw rugs or furniture in walking pathways.   Continue medications as instructed.   You may shower or take tub baths when your staples or stitches are removed or as instructed.   Walk using crutches or walker as instructed.   Put weight on your legs and walk as much as is comfortable.   You may resume a sexual relationship in one month or when given the OK by your doctor.   Return to work as instructed by your doctor.   Do not drive a car for 6 weeks or as instructed.   Wear elastic stockings until instructed not to.   Make sure you keep all of your appointments after your operation with all of your doctors and caregivers.  RANGE OF MOTION AND STRENGTHENING EXERCISES Rehabilitation of the knee is important following a knee injury or an operation. After just a few days of immobilization, the muscles of the thigh which control the knee become weakened and shrink (atrophy). Knee exercises are designed to build up the tone and strength of the thigh muscles and to improve knee motion. Often times heat used for twenty to thirty minutes before working out will loosen up your tissues and help with improving the range of motion. These exercises can be done on a training (exercise) mat, on the floor, on a table or on a bed. Use what ever works the best and is most  comfortable for you Knee exercises include:  Leg Lifts - While your knee is still immobilized in a splint or cast, you can do straight leg raises. Lift the leg to 60 degrees, hold for 3 sec, and slowly lower the leg. Repeat 10-20 times 2-3 times daily. Perform this exercise against resistance later as your knee gets better.   Quad and Hamstring Sets - Tighten up the muscle on the front of the thigh (Quad) and hold for 5-10 sec. Repeat this 10-20 times hourly. Hamstring sets are done by pushing the foot backward against an object and holding for 5-10 sec. Repeat as with quad sets.  A rehabilitation program following serious knee injuries can speed recovery and prevent re-injury in the future due to weakened muscles. Contact your doctor or a physical therapist for more information on knee rehabilitation. MAKE SURE YOU:   Understand these instructions.   Will watch your condition.   Will get help right away if you are not doing well or get worse.  Document Released: 11/06/2005 Document Revised: 10/26/2011 Document Reviewed: 04/26/2007 [employer] Patient Information 2012 [employer].  Pick up stool softner and laxative for home. Do not submerge incision under water. May shower. Continue to use ice for pain and swelling from surgery.  

## 2012-02-15 NOTE — Progress Notes (Signed)
Physical Therapy Treatment Patient Details Name: Karen Kaiser MRN: [id number] DOB: December 15, 1934 Today's Date: 02/15/2012  PT Assessment/Plan  PT - Assessment/Plan Comments on Treatment Session: Pt ltd by nausea, fatigue and anxiety PT Plan: Discharge plan remains appropriate PT Frequency: 7X/week Recommendations for Other Services: OT consult Follow Up Recommendations: Home health PT Equipment Recommended: 3 in 1 bedside comode PT Goals  Acute Rehab PT Goals PT Goal Formulation: With patient Time For Goal Achievement: 7 days Pt will go Supine/Side to Sit: with supervision Pt will go Sit to Supine/Side: with supervision Pt will go Sit to Stand: with supervision PT Goal: Sit to Stand - Progress: Progressing toward goal Pt will go Stand to Sit: with supervision PT Goal: Stand to Sit - Progress: Progressing toward goal Pt will Ambulate: 51 - 150 feet;with supervision;with rolling walker PT Goal: Ambulate - Progress: Progressing toward goal Pt will Go Up / Down Stairs: 3-5 stairs PT Goal: Up/Down Stairs - Progress: Progressing toward goal  PT Treatment Precautions/Restrictions  Precautions Precautions: Knee Required Braces or Orthoses: Yes Knee Immobilizer: Discontinue once straight leg raise with < 10 degree lag Restrictions Weight Bearing Restrictions: No RLE Weight Bearing: Weight bearing as tolerated Other Position/Activity Restrictions: WBAT Mobility (including Balance) Bed Mobility Supine to Sit: 4: Min assist;HOB flat;With rails Supine to Sit Details (indicate cue type and reason): cues for hand placement and technique Transfers Sit to Stand: 4: Min assist;5: Supervision Sit to Stand Details (indicate cue type and reason): min cues for use of UEs Stand to Sit: With upper extremity assist;To chair/3-in-1;With armrests;Other (comment) Stand to Sit Details: min cues for LE management and use of UEs Ambulation/Gait Ambulation/Gait Assistance: 4: Min  assist Ambulation/Gait Assistance Details (indicate cue type and reason): min cues for posture and position from RW Ambulation Distance (Feet): 58 Feet Assistive device: Rolling walker Gait Pattern: Step-to pattern Stairs: Yes Stairs Assistance: 4: Min assist Stairs Assistance Details (indicate cue type and reason): cues for sequence and placement of foot/crutch Stair Management Technique: One rail Left;Forwards;With crutches;Step to pattern Number of Stairs: 3  (3 x 2)    Exercise    End of Session PT - End of Session Equipment Utilized During Treatment: Right knee immobilizer Activity Tolerance: Patient limited by pain;Patient limited by fatigue Patient left: with call bell in reach;with family/visitor present;in chair General Behavior During Session: [facility] for tasks performed Cognition: [facility] for tasks performed  Callie Facey 02/15/2012, 1:44 PM

## 2012-02-15 NOTE — Progress Notes (Signed)
Subjective: 3 Days Post-Op Procedure(s) (LRB): TOTAL KNEE ARTHROPLASTY (Right) Patient reports pain as mild.   Patient seen in rounds by Dr. Lequita Halt. Patient still having pain.  If continues to progress then plan for home later this afternoon.   Objective: Vital signs in last 24 hours: Temp:  [97.5 F (36.4 C)-98.7 F (37.1 C)] 98.7 F (37.1 C) (03/28 0540) Pulse Rate:  [62-75] 75  (03/28 0540) Resp:  [18-20] 20  (03/28 0540) BP: (130-162)/(81-82) 130/82 mmHg (03/28 0540) SpO2:  [96 %-99 %] 96 % (03/28 0540)  Intake/Output from previous day:  Intake/Output Summary (Last 24 hours) at 02/15/12 1024 Last data filed at 02/15/12 0914  Gross per 24 hour  Intake   1200 ml  Output   1350 ml  Net   -150 ml    Intake/Output this shift: Total I/O In: 240 [P.O.:240] Out: -   Labs:  Basename 02/15/12 0452 02/14/12 0425 02/13/12 0410  HGB 11.1* 10.9* 10.5*    Basename 02/15/12 0452 02/14/12 0425  WBC 13.7* 15.8*  RBC 3.61* 3.56*  HCT 33.7* 33.0*  PLT 373 410*    Basename 02/15/12 0452 02/14/12 0425  NA 133* 133*  K 3.6 4.3  CL 94* 97  CO2 28 28  BUN 12 9  CREATININE 0.81 0.61  GLUCOSE 133* 113*  CALCIUM 9.3 9.2   No results found for this basename: LABPT:2,INR:2 in the last 72 hours  Exam: Neurovascular intact Sensation intact distally Incision - clean, dry, no drainage, healing Motor function intact - moving foot and toes well on exam.   Assessment/Plan: 3 Days Post-Op Procedure(s) (LRB): TOTAL KNEE ARTHROPLASTY (Right) Procedure(s) (LRB): TOTAL KNEE ARTHROPLASTY (Right) Past Medical History  Diagnosis Date  . PONV (postoperative nausea and vomiting)   . Hypertension   . Heart murmur     as a child   . Peripheral vascular disease     varciose veins in right leg   . Recurrent upper respiratory infection (URI)     sinus infection 1/13 2/13 for tx   . GERD (gastroesophageal reflux disease)   . Chronic kidney disease     urgency and stress incontinence     . Arthritis     generalized arthrtis   . Cancer     breast mastectomy - 1996 lung ca 2004- RLL  . Glaucoma    Principal Problem:  *OA (osteoarthritis) of knee   Up with therapy Discharge home with home health Diet - heart healthy Follow up - in 2 weeks Activity - WBAT Condition Upon Discharge - Fair D/C Meds - See DC Summary DVT Prophylaxis - Xarelto Protocol  Manraj Yeo 02/15/2012, 10:24 AM

## 2012-02-15 NOTE — Progress Notes (Signed)
02/15/2012 Raynelle Bring BSN CCM [phone] Pt does want 3N1and 3n1- DME to be delivered to patient's room. Genevieve Norlander will start [facility] services day after patient is discharged to home.

## 2012-02-15 NOTE — Evaluation (Signed)
Occupational Therapy Evaluation Patient Details Name: Karen Kaiser MRN: [id number] DOB: May 24, 1935 Today's Date: 02/15/2012  Problem List:  Patient Active Problem List  Diagnoses  . OA (osteoarthritis) of knee    Past Medical History:  Past Medical History  Diagnosis Date  . PONV (postoperative nausea and vomiting)   . Hypertension   . Heart murmur     as a child   . Peripheral vascular disease     varciose veins in right leg   . Recurrent upper respiratory infection (URI)     sinus infection 1/13 2/13 for tx   . GERD (gastroesophageal reflux disease)   . Chronic kidney disease     urgency and stress incontinence   . Arthritis     generalized arthrtis   . Cancer     breast mastectomy - 1996 lung ca 2004- RLL  . Glaucoma    Past Surgical History:  Past Surgical History  Procedure Date  . Mastectomy     right   . Lobectomy     RLL   . Other surgical history     right rotator cuff surgery   . Eye surgery     bilateral cataract srugery   . Breast biopsy     left breast biopsy 2006   . Dilation and curettage of uterus     OT Assessment/Plan/Recommendation OT Assessment Clinical Impression Statement: Pt presents with R TKR POD#3. Skilled OT recommended to maximize I w/BADLs to supervision level in prep for d/c home with prn A from family and HHOT. OT Recommendation/Assessment: Patient will need skilled OT in the acute care venue OT Problem List: Decreased activity tolerance;Decreased safety awareness;Decreased knowledge of use of DME or AE;Pain;Impaired balance (sitting and/or standing) OT Therapy Diagnosis : Generalized weakness OT Plan OT Frequency: Min 2X/week OT Treatment/Interventions: Self-care/ADL training;Therapeutic activities;Patient/family education;DME and/or AE instruction OT Recommendation Follow Up Recommendations: Home health OT Equipment Recommended: 3 in 1 bedside comode Individuals Consulted Consulted and Agree with Results and  Recommendations: Patient;Family member/caregiver Family Member Consulted: husband OT Goals Acute Rehab OT Goals OT Goal Formulation: With patient/family Time For Goal Achievement: 2 weeks ADL Goals Pt Will Perform Grooming: with modified independence;Standing at sink ADL Goal: Grooming - Progress: Goal set today Pt Will Transfer to Toilet: with supervision;3-in-1;Ambulation ADL Goal: Toilet Transfer - Progress: Goal set today Pt Will Perform Toileting - Clothing Manipulation: with supervision;Standing ADL Goal: Toileting - Clothing Manipulation - Progress: Goal set today Pt Will Perform Toileting - Hygiene: with supervision;Sit to stand from 3-in-1/toilet ADL Goal: Toileting - Hygiene - Progress: Goal set today Pt Will Perform Tub/Shower Transfer: Shower transfer;Ambulation ADL Goal: Web designer - Progress: Goal set today  OT Evaluation Precautions/Restrictions  Precautions Precautions: Knee Required Braces or Orthoses: Yes Knee Immobilizer: Discontinue once straight leg raise with < 10 degree lag Restrictions Weight Bearing Restrictions: No RLE Weight Bearing: Weight bearing as tolerated Other Position/Activity Restrictions: WBAT Prior Functioning Home Living Lives With: Spouse Receives Help From: Family Type of Home: House Home Layout: One level Home Access: Stairs to enter Entrance Stairs-Rails: Left Entrance Stairs-Number of Steps: 4+1 stairs Bathroom Shower/Tub: Health visitor: Handicapped height (with riser) Home Adaptive Equipment: Grab bars in shower;Shower chair with back;Grab bars around toilet Prior Function Level of Independence: Independent with basic ADLs;Independent with transfers;Independent with gait;Independent with homemaking with ambulation Driving: Yes Vocation: Retired ADL ADL Grooming: Performed;Wash/dry hands;Supervision/safety Where Assessed - Grooming: Standing at sink Upper Body Bathing: Simulated;Set up Where  Assessed - Upper  Body Bathing: Sitting, bed;Unsupported Lower Body Bathing: Simulated;Minimal assistance Where Assessed - Lower Body Bathing: Sit to stand from bed Upper Body Dressing: Simulated;Set up Where Assessed - Upper Body Dressing: Sitting, bed;Unsupported Lower Body Dressing: Simulated;Moderate assistance Where Assessed - Lower Body Dressing: Sit to stand from bed Toilet Transfer: Performed;Minimal assistance Toilet Transfer Method: Ambulating Toilet Transfer Equipment: Raised toilet seat with arms (or 3-in-1 over toilet) Toileting - Clothing Manipulation: Performed;Minimal assistance Where Assessed - Toileting Clothing Manipulation: Sit to stand from 3-in-1 or toilet Toileting - Hygiene: Performed;Minimal assistance Where Assessed - Toileting Hygiene: Sit to stand from 3-in-1 or toilet Tub/Shower Transfer: Performed;Minimal assistance Tub/Shower Transfer Method: Ambulating Equipment Used: Rolling walker;Other (comment) (3:1) Vision/Perception    Cognition Cognition Arousal/Alertness: Awake/alert Overall Cognitive Status: Appears within functional limits for tasks assessed Orientation Level: Oriented X4 Sensation/Coordination   Extremity Assessment RUE Assessment RUE Assessment: Within Functional Limits LUE Assessment LUE Assessment: Within Functional Limits Mobility  Bed Mobility Supine to Sit: 4: Min assist;HOB flat;With rails Supine to Sit Details (indicate cue type and reason): cues for hand placement and technique Transfers Sit to Stand: From elevated surface;With upper extremity assist;With armrests;From bed;From chair/3-in-1;Other (comment) (Minguard A) Sit to Stand Details (indicate cue type and reason): occasional cues for hand placement and LE position Stand to Sit: With upper extremity assist;To chair/3-in-1;With armrests;Other (comment) (Minguard A ) Stand to Sit Details: occasional cues for hand placement and LE position Exercises   End of Session OT -  End of Session Equipment Utilized During Treatment: Gait belt Activity Tolerance: Patient tolerated treatment well Patient left: in chair;with call bell in reach General Behavior During Session: [facility] for tasks performed Cognition: [facility] for tasks performed   Tasfia Vasseur A OTR/L [phone] 02/15/2012, 11:43 AM

## 2012-02-16 ENCOUNTER — Encounter (HOSPITAL_COMMUNITY): Payer: Self-pay | Admitting: Orthopedic Surgery

## 2012-02-16 LAB — CBC
Hemoglobin: 9.8 g/dL — ABNORMAL LOW (ref 12.0–15.0)
RBC: 3.23 MIL/uL — ABNORMAL LOW (ref 3.87–5.11)
WBC: 12.4 10*3/uL — ABNORMAL HIGH (ref 4.0–10.5)

## 2012-02-16 LAB — BASIC METABOLIC PANEL
CO2: 27 mEq/L (ref 19–32)
Chloride: 98 mEq/L (ref 96–112)
GFR calc non Af Amer: 83 mL/min — ABNORMAL LOW (ref >90)
Glucose, Bld: 101 mg/dL — ABNORMAL HIGH (ref 70–99)
Potassium: 3.5 mEq/L (ref 3.5–5.1)
Sodium: 133 mEq/L — ABNORMAL LOW (ref 135–145)

## 2012-02-16 NOTE — Progress Notes (Signed)
OT Cancellation Note  ___Treatment cancelled today due to medical issues with patient which prohibited therapy  ___ Treatment cancelled today due to patient receiving procedure or test   _x__ Treatment cancelled today due to patient's refusal to participate. Stated she was too worn out from PT. Will check back as schedule permits.   ___ Treatment cancelled today due to   Signature: Garrel Ridgel, OTR/L  Pager [phone] 02/16/2012

## 2012-02-16 NOTE — Progress Notes (Signed)
Physical Therapy Treatment Patient Details Name: Karen Kaiser MRN: [id number] DOB: 1935-11-07 Today's Date: 02/16/2012  PT Assessment/Plan  PT - Assessment/Plan Comments on Treatment Session: Marked improvement in activity tolerance from last day.  Reviewed don/doff KI and car transfers with pt and spouse. PT Plan: Discharge plan remains appropriate PT Frequency: 7X/week Recommendations for Other Services: OT consult Follow Up Recommendations: Home health PT Equipment Recommended: 3 in 1 bedside comode PT Goals  Acute Rehab PT Goals PT Goal Formulation: With patient Time For Goal Achievement: 6 days Pt will go Supine/Side to Sit: with supervision PT Goal: Supine/Side to Sit - Progress: Progressing toward goal Pt will go Sit to Supine/Side: with supervision PT Goal: Sit to Supine/Side - Progress: Progressing toward goal Pt will go Sit to Stand: with supervision PT Goal: Sit to Stand - Progress: Progressing toward goal Pt will go Stand to Sit: with supervision PT Goal: Stand to Sit - Progress: Progressing toward goal Pt will Ambulate: 51 - 150 feet;with supervision;with rolling walker PT Goal: Ambulate - Progress: Progressing toward goal Pt will Go Up / Down Stairs: 3-5 stairs PT Goal: Up/Down Stairs - Progress: Progressing toward goal  PT Treatment Precautions/Restrictions  Precautions Precautions: Knee Required Braces or Orthoses: Yes Knee Immobilizer: Discontinue once straight leg raise with < 10 degree lag Restrictions Weight Bearing Restrictions: No RLE Weight Bearing: Weight bearing as tolerated Other Position/Activity Restrictions: WBAT Mobility (including Balance) Bed Mobility Supine to Sit: 4: Min assist;HOB flat Supine to Sit Details (indicate cue type and reason): husband assisting Transfers Sit to Stand: 5: Supervision;From bed;From chair/3-in-1;With upper extremity assist;With armrests Stand to Sit: 5: Supervision;With armrests;With upper extremity  assist;To chair/3-in-1 Ambulation/Gait Ambulation/Gait Assistance: 5: Supervision Ambulation/Gait Assistance Details (indicate cue type and reason): cues for posture and position from RW Ambulation Distance (Feet): 120 Feet (120 + 25) Assistive device: Rolling walker Gait Pattern: Step-to pattern Stairs: Yes Stairs Assistance: 4: Min assist Stairs Assistance Details (indicate cue type and reason): Spouse assisting with min VC Stair Management Technique: One rail Left;Forwards;With crutches;Step to pattern Number of Stairs: 3     Exercise    End of Session PT - End of Session Equipment Utilized During Treatment: Right knee immobilizer Activity Tolerance: Patient tolerated treatment well Patient left: in chair;with call bell in reach;with family/visitor present Nurse Communication: Mobility status for transfers;Mobility status for ambulation General Behavior During Session: [facility] for tasks performed Cognition: [facility] for tasks performed  Karen Kaiser 02/16/2012, 12:46 PM

## 2012-02-16 NOTE — Progress Notes (Signed)
Subjective: 4 Days Post-Op Procedure(s) (LRB): TOTAL KNEE ARTHROPLASTY (Right) Patient reports pain as mild.   Patient seen in rounds with Dr. Lequita Halt. Patient doing better today.  Objective: Vital signs in last 24 hours: Temp:  [97.5 F (36.4 C)-99.2 F (37.3 C)] 98 F (36.7 C) (03/29 0527) Pulse Rate:  [58-69] 69  (03/29 0527) Resp:  [15-20] 15  (03/29 0527) BP: (90-169)/(56-80) 160/79 mmHg (03/29 0527) SpO2:  [99 %-100 %] 100 % (03/29 0527)  Intake/Output from previous day:  Intake/Output Summary (Last 24 hours) at 02/16/12 0751 Last data filed at 02/16/12 0555  Gross per 24 hour  Intake 1276.67 ml  Output   2055 ml  Net -778.33 ml    Intake/Output this shift:    Labs:  Basename 02/16/12 0435 02/15/12 0452 02/14/12 0425  HGB 9.8* 11.1* 10.9*    Basename 02/16/12 0435 02/15/12 0452  WBC 12.4* 13.7*  RBC 3.23* 3.61*  HCT 30.4* 33.7*  PLT 348 373    Basename 02/16/12 0435 02/15/12 0452  NA 133* 133*  K 3.5 3.6  CL 98 94*  CO2 27 28  BUN 12 12  CREATININE 0.71 0.81  GLUCOSE 101* 133*  CALCIUM 8.8 9.3   No results found for this basename: LABPT:2,INR:2 in the last 72 hours  Exam: Neurovascular intact Sensation intact distally Incision - clean, dry, no drainage Motor function intact - moving foot and toes well on exam.   Assessment/Plan: 4 Days Post-Op Procedure(s) (LRB): TOTAL KNEE ARTHROPLASTY (Right) Procedure(s) (LRB): TOTAL KNEE ARTHROPLASTY (Right) Past Medical History  Diagnosis Date  . PONV (postoperative nausea and vomiting)   . Hypertension   . Heart murmur     as a child   . Peripheral vascular disease     varciose veins in right leg   . Recurrent upper respiratory infection (URI)     sinus infection 1/13 2/13 for tx   . GERD (gastroesophageal reflux disease)   . Chronic kidney disease     urgency and stress incontinence   . Arthritis     generalized arthrtis   . Cancer     breast mastectomy - 1996 lung ca 2004- RLL  .  Glaucoma    Principal Problem:  *OA (osteoarthritis) of knee   Up with therapy Discharge home with home health Diet - heart healthy Follow up - in 2 weeks Activity - WBAT Condition Upon Discharge - Good D/C Meds - See DC Summary DVT Prophylaxis - Xarelto Protocol   Annakate Soulier 02/16/2012, 7:51 AM

## 2012-02-16 NOTE — Progress Notes (Signed)
Physical Therapy Treatment Patient Details Name: Karen Kaiser MRN: [id number] DOB: 1934-11-21 Today's Date: 02/16/2012  PT Assessment/Plan  PT - Assessment/Plan Comments on Treatment Session: Marked improvement in activity tolerance from last day.  Reviewed don/doff KI and car transfers with pt and spouse. PT Plan: Discharge plan remains appropriate PT Frequency: 7X/week Recommendations for Other Services: OT consult Follow Up Recommendations: Home health PT Equipment Recommended: 3 in 1 bedside comode PT Goals  Acute Rehab PT Goals PT Goal Formulation: With patient Time For Goal Achievement: 6 days Pt will go Supine/Side to Sit: with supervision PT Goal: Supine/Side to Sit - Progress: Progressing toward goal Pt will go Sit to Supine/Side: with supervision PT Goal: Sit to Supine/Side - Progress: Progressing toward goal Pt will go Sit to Stand: with supervision PT Goal: Sit to Stand - Progress: Progressing toward goal Pt will go Stand to Sit: with supervision PT Goal: Stand to Sit - Progress: Progressing toward goal Pt will Ambulate: 51 - 150 feet;with supervision;with rolling walker PT Goal: Ambulate - Progress: Progressing toward goal Pt will Go Up / Down Stairs: 3-5 stairs PT Goal: Up/Down Stairs - Progress: Progressing toward goal  PT Treatment Precautions/Restrictions  Precautions Precautions: Knee Required Braces or Orthoses: Yes Knee Immobilizer: Discontinue once straight leg raise with < 10 degree lag Restrictions Weight Bearing Restrictions: No RLE Weight Bearing: Weight bearing as tolerated Other Position/Activity Restrictions: WBAT    Exercise  Total Joint Exercises Ankle Circles/Pumps: AROM;20 reps;Supine;Both Quad Sets: AROM;20 reps;Supine;Both Short Arc Quad: AAROM;10 reps;5 reps;Supine;Both Heel Slides: AAROM;20 reps;Right;Supine Straight Leg Raises: AAROM;20 reps;Right;Supine End of Session PT - End of Session Equipment Utilized During Treatment:  Right knee immobilizer Activity Tolerance: Patient tolerated treatment well Patient left: in chair;with call bell in reach;with family/visitor present Nurse Communication: Mobility status for transfers;Mobility status for ambulation General Behavior During Session: [facility] for tasks performed Cognition: [facility] for tasks performed  Miaya Lafontant 02/16/2012, 1:08 PM

## 2012-02-17 DIAGNOSIS — Z471 Aftercare following joint replacement surgery: Secondary | ICD-10-CM | POA: Diagnosis not present

## 2012-02-17 DIAGNOSIS — M069 Rheumatoid arthritis, unspecified: Secondary | ICD-10-CM | POA: Diagnosis not present

## 2012-02-17 DIAGNOSIS — IMO0001 Reserved for inherently not codable concepts without codable children: Secondary | ICD-10-CM | POA: Diagnosis not present

## 2012-02-17 DIAGNOSIS — Z96659 Presence of unspecified artificial knee joint: Secondary | ICD-10-CM | POA: Diagnosis not present

## 2012-02-17 DIAGNOSIS — M1710 Unilateral primary osteoarthritis, unspecified knee: Secondary | ICD-10-CM | POA: Diagnosis not present

## 2012-02-17 DIAGNOSIS — I739 Peripheral vascular disease, unspecified: Secondary | ICD-10-CM | POA: Diagnosis not present

## 2012-02-19 DIAGNOSIS — M069 Rheumatoid arthritis, unspecified: Secondary | ICD-10-CM | POA: Diagnosis not present

## 2012-02-19 DIAGNOSIS — Z96659 Presence of unspecified artificial knee joint: Secondary | ICD-10-CM | POA: Diagnosis not present

## 2012-02-19 DIAGNOSIS — Z471 Aftercare following joint replacement surgery: Secondary | ICD-10-CM | POA: Diagnosis not present

## 2012-02-19 DIAGNOSIS — IMO0001 Reserved for inherently not codable concepts without codable children: Secondary | ICD-10-CM | POA: Diagnosis not present

## 2012-02-19 DIAGNOSIS — I739 Peripheral vascular disease, unspecified: Secondary | ICD-10-CM | POA: Diagnosis not present

## 2012-02-20 DIAGNOSIS — M069 Rheumatoid arthritis, unspecified: Secondary | ICD-10-CM | POA: Diagnosis not present

## 2012-02-20 DIAGNOSIS — I739 Peripheral vascular disease, unspecified: Secondary | ICD-10-CM | POA: Diagnosis not present

## 2012-02-20 DIAGNOSIS — Z471 Aftercare following joint replacement surgery: Secondary | ICD-10-CM | POA: Diagnosis not present

## 2012-02-20 DIAGNOSIS — IMO0001 Reserved for inherently not codable concepts without codable children: Secondary | ICD-10-CM | POA: Diagnosis not present

## 2012-02-20 DIAGNOSIS — Z96659 Presence of unspecified artificial knee joint: Secondary | ICD-10-CM | POA: Diagnosis not present

## 2012-02-21 DIAGNOSIS — Z471 Aftercare following joint replacement surgery: Secondary | ICD-10-CM | POA: Diagnosis not present

## 2012-02-21 DIAGNOSIS — IMO0001 Reserved for inherently not codable concepts without codable children: Secondary | ICD-10-CM | POA: Diagnosis not present

## 2012-02-21 DIAGNOSIS — I739 Peripheral vascular disease, unspecified: Secondary | ICD-10-CM | POA: Diagnosis not present

## 2012-02-21 DIAGNOSIS — M069 Rheumatoid arthritis, unspecified: Secondary | ICD-10-CM | POA: Diagnosis not present

## 2012-02-21 DIAGNOSIS — Z96659 Presence of unspecified artificial knee joint: Secondary | ICD-10-CM | POA: Diagnosis not present

## 2012-02-22 DIAGNOSIS — I739 Peripheral vascular disease, unspecified: Secondary | ICD-10-CM | POA: Diagnosis not present

## 2012-02-22 DIAGNOSIS — M069 Rheumatoid arthritis, unspecified: Secondary | ICD-10-CM | POA: Diagnosis not present

## 2012-02-22 DIAGNOSIS — Z96659 Presence of unspecified artificial knee joint: Secondary | ICD-10-CM | POA: Diagnosis not present

## 2012-02-22 DIAGNOSIS — E876 Hypokalemia: Secondary | ICD-10-CM

## 2012-02-22 DIAGNOSIS — Z471 Aftercare following joint replacement surgery: Secondary | ICD-10-CM | POA: Diagnosis not present

## 2012-02-22 DIAGNOSIS — IMO0001 Reserved for inherently not codable concepts without codable children: Secondary | ICD-10-CM | POA: Diagnosis not present

## 2012-02-22 DIAGNOSIS — E871 Hypo-osmolality and hyponatremia: Secondary | ICD-10-CM

## 2012-02-22 NOTE — Discharge Summary (Signed)
Physician Discharge Summary   Patient ID: Karen Kaiser MRN: [id number] DOB/AGE: 01/17/1935 77 y.o.  Admit date: 02/12/2012 Discharge date: 02/16/2012  Primary Diagnosis: Osteoarthritis Right Knee  Admission Diagnoses: Past Medical History  Diagnosis Date  . PONV (postoperative nausea and vomiting)   . Hypertension   . Heart murmur     as a child   . Peripheral vascular disease     varciose veins in right leg   . Recurrent upper respiratory infection (URI)     sinus infection 1/13 2/13 for tx   . GERD (gastroesophageal reflux disease)   . Chronic kidney disease     urgency and stress incontinence   . Arthritis     generalized arthrtis   . Cancer     breast mastectomy - 1996 lung ca 2004- RLL  . Glaucoma     Discharge Diagnoses:  Principal Problem:  *OA (osteoarthritis) of knee Active Problems:  Postop Hypokalemia  Postop Hyponatremia   Procedure: Procedure(s) (LRB): TOTAL KNEE ARTHROPLASTY (Right)   Consults: None  HPI: Karen Kaiser is a 77 y.o. year old female with end stage OA of her right knee with progressively worsening pain and dysfunction. She has constant pain, with activity and at rest and significant functional deficits with difficulties even with ADLs. She has had extensive non-op management including analgesics, injections of cortisone and viscosupplements, and home exercise program, but remains in significant pain with significant dysfunction.Radiographs show bone on bone arthritis lateral and patellofemoral with tibial subluxation. She presents now for left Total Knee Arthroplasty.   Laboratory Data: Hospital Outpatient Visit on 02/02/2012  Component Date Value Range Status  . aPTT (seconds) 02/02/2012 34  24-37 Final  . WBC (K/uL) 02/02/2012 9.2  4.0-10.5 Final  . RBC (MIL/uL) 02/02/2012 4.09  3.87-5.11 Final  . Hemoglobin (g/dL) 40/98/1191 47.8  29.5-62.1 Final  . HCT (%) 02/02/2012 37.4  36.0-46.0 Final  . MCV (fL) 02/02/2012 91.4   78.0-100.0 Final  . MCH (pg) 02/02/2012 30.3  26.0-34.0 Final  . MCHC (g/dL) 30/86/5784 69.6  29.5-28.4 Final  . RDW (%) 02/02/2012 13.6  11.5-15.5 Final  . Platelets (K/uL) 02/02/2012 462* 150-400 Final  . Sodium (mEq/L) 02/02/2012 129* 135-145 Final  . Potassium (mEq/L) 02/02/2012 3.4* 3.5-5.1 Final  . Chloride (mEq/L) 02/02/2012 90* 96-112 Final  . CO2 (mEq/L) 02/02/2012 26  19-32 Final  . Glucose, Bld (mg/dL) 13/24/4010 272* 53-66 Final  . BUN (mg/dL) 44/01/4741 20  5-95 Final  . Creatinine, Ser (mg/dL) 63/87/5643 3.29  5.18-8.41 Final  . Calcium (mg/dL) 66/04/3015 9.8  0.1-09.3 Final  . Total Protein (g/dL) 23/55/7322 7.5  0.2-5.4 Final  . Albumin (g/dL) 27/04/2375 3.5  2.8-3.1 Final  . AST (U/L) 02/02/2012 17  0-37 Final  . ALT (U/L) 02/02/2012 19  0-35 Final  . Alkaline Phosphatase (U/L) 02/02/2012 68  39-117 Final  . Total Bilirubin (mg/dL) 51/76/1607 0.4  3.7-1.0 Final  . GFR calc non Af Amer (mL/min) 02/02/2012 82* >90 Final  . GFR calc Af Amer (mL/min) 02/02/2012 >90  >90 Final   Comment:                                 The eGFR has been calculated                          using the CKD EPI equation.  This calculation has not been                          validated in all clinical                          situations.                          eGFR's persistently                          <90 mL/min signify                          possible Chronic Kidney Disease.  Marland Kitchen Prothrombin Time (seconds) 02/02/2012 13.4  11.6-15.2 Final  . INR  02/02/2012 1.00  0.00-1.49 Final  . Color, Urine  02/02/2012 YELLOW  YELLOW Final  . APPearance  02/02/2012 CLEAR  CLEAR Final  . Specific Gravity, Urine  02/02/2012 1.012  1.005-1.030 Final  . pH  02/02/2012 7.5  5.0-8.0 Final  . Glucose, UA (mg/dL) 16/08/9603 NEGATIVE  NEGATIVE Final  . Hgb urine dipstick  02/02/2012 NEGATIVE  NEGATIVE Final  . Bilirubin Urine  02/02/2012 NEGATIVE  NEGATIVE Final  . Ketones, ur  (mg/dL) 54/07/8118 NEGATIVE  NEGATIVE Final  . Protein, ur (mg/dL) 14/78/2956 NEGATIVE  NEGATIVE Final  . Urobilinogen, UA (mg/dL) 21/30/8657 0.2  8.4-6.9 Final  . Nitrite  02/02/2012 NEGATIVE  NEGATIVE Final  . Leukocytes, UA  02/02/2012 NEGATIVE  NEGATIVE Final   MICROSCOPIC NOT DONE ON URINES WITH NEGATIVE PROTEIN, BLOOD, LEUKOCYTES, NITRITE, OR GLUCOSE <1000 mg/dL.  Marland Kitchen MRSA, PCR  02/02/2012 NEGATIVE  NEGATIVE Final  . Staphylococcus aureus  02/02/2012 NEGATIVE  NEGATIVE Final   Comment:                                 The Xpert SA Assay (FDA                          approved for NASAL specimens                          only), is one component of                          a comprehensive surveillance                          program.  It is not intended                          to diagnose infection nor to                          guide or monitor treatment.   No results found for this basename: HGB:5 in the last 72 hours No results found for this basename: WBC:2,RBC:2,HCT:2,PLT:2 in the last 72 hours No results found for this basename: NA:2,K:2,CL:2,CO2:2,BUN:2,CREATININE:2,GLUCOSE:2,CALCIUM:2 in the last 72 hours No results found for this basename: LABPT:2,INR:2 in the last 72 hours  X-Rays:Dg Chest 2 View  02/02/2012  *RADIOLOGY REPORT*  Clinical Data:  Preop for right TKA.  History of lung cancer and lobectomy.  History of hypertension.  CHEST - 2 VIEW  Comparison: 04/13/2011  Findings: The lungs are hyperinflated.  Heart size is normal.  No focal consolidations or pleural effusions are identified.  No edema.  Postoperative changes are identified at the right infrahilar region. Visualized osseous structures have a normal appearance.  IMPRESSION: No evidence for acute cardiopulmonary abnormality.  Original Report Authenticated By: Patterson Hammersmith, M.D.   Dg Bone Density  02/08/2012  *RADIOLOGY REPORT*  Clinical Data: 77 year old patient for follow-up bone mineral density testing.   History of lung cancer.  Osteoporosis.  DUAL X-RAY ABSORPTIOMETRY (DXA) FOR BONE MINERAL DENSITY  AP LUMBAR SPINE L1-L4  Bone Mineral Density (BMD):            0.717 g/cm2 Young Adult T Score:                          -3.0 Z Score:                                            -0.6  LEFT FEMUR NECK  Bone Mineral Density (BMD):             0.673 g/cm2 Young Adult T Score:                           -1.6 Z Score:                                              0.5  ASSESSMENT:  Patient's diagnostic category is OSTEOPOROSIS by WHO Criteria.  FRACTURE RISK:  Bone mineral density of the lumbar spine has decreased compared to the previous study of 2010 by 6.1%, and decreased compared to the baseline study of 2004 by 4.6%.  Bone mineral density of the left femur has decreased by 7.9% compared to the previous study of 2010 and decreased by 5.1% compared to the baseline study of 2004.  Comparison: 01/26/2003 and 08/12/2009  RECOMMENDATIONS:  Effective therapies are available in the form of bisphosphonates, selective estrogen receptor modulators, biologic agents, and hormone replacement therapy (for women).  All patients should ensure an adequate intake of dietary calcium (1200mg  daily) and vitamin D (800 IU daily) unless contraindicated.  All treatment decisions require clinical judgement and consideration of individual patient factors, including patient preferences, co-morbidities, previous drug use, risk factors not captured in the FRAX model (e.g., frailty, falls, vitamin D deficiency, increased bone turnover, interval significant decline in bone density) and possible under-or over-estimation of fracture risk by FRAX.  The National Osteoporosis Foundation recommends that FDA-approved medical therapies be considered in postmenopausal women and mean age 100 or older with a:        1)     Hip or vertebral (clinical or morphometric) fracture.           2)    T-score of -2.5 or lower at the spine or hip. 3)    Ten-year fracture  probability by FRAX of 3% or greater for hip fracture or 20% or greater for major osteoporotic fracture. FOLLOW-UP:  People with diagnosed cases of osteoporosis or at high risk for fracture  should have regular bone mineral density tests.  For patients eligible for Medicare, routine testing is allowed once every 2 years.  The testing frequency can be increased to one year for patients who have rapidly progressing disease, those who are receiving or discontinuing medical therapy to restore bone mass, or have additional risk factors.  World Science writer [facility]) Criteria:  Normal: T scores from +1.0 to -1.0 Low Bone Mass (Osteopenia): T scores between -1.0 and -2.5 Osteoporosis: T scores -2.5 and below  Comparison to Reference Population:  T score is the key measure used in the diagnosis of osteoporosis and relative risk determination for fracture.  It provides a value for bone mass relative to the mean bone mass of a young adult reference population expressed in terms of standard deviation (SD).  Z score is the age-matched score showing the patient's values compared to a population matched for age, sex, and race.  This is also expressed in terms of standard deviation.  The patient may have values that compare favorably to the age-matched values and still be at increased risk for fracture.  Original Report Authenticated By: Britta Mccreedy, M.D.   Mm Digital Screening Unilat L  02/09/2012  DG SCREENING MAMMOGRAM LEFT CC and MLO view(s) were taken of the left breast.  DIGITAL SCREENING MAMMOGRAM WITH CAD: There are scattered fibroglandular densities.  No masses or malignant type calcifications are  identified.  Compared with prior studies.  Images were processed with CAD.  IMPRESSION: No specific mammographic evidence of malignancy.  Next screening mammogram is recommended in one  year.  A result letter of this screening mammogram will be mailed directly to the patient.  ASSESSMENT: Benign - BI-RADS 2  Screening  mammogram in 1 year. Addendum CORRECTION:  The report is corrected to state Dr. Carmelina Noun as the interpreting radiologist. ,    EKG: Orders placed during the hospital encounter of 02/12/12  . EKG     Hospital Course: Patient was admitted to [facility] and taken to the OR and underwent the above state procedure without complications.  Patient tolerated the procedure well and was later transferred to the recovery room and then to the orthopaedic floor for postoperative care.  They were given PO and IV analgesics for pain control following their surgery.  They were given 24 hours of postoperative antibiotics and started on DVT prophylaxis.   PT and OT were ordered for total joint protocol.  Discharge planning consulted to help with postop disposition and equipment needs.  Patient had a tough night on the evening of surgery but started to get up with therapy on day one.  PCA was discontinued and they were weaned over to PO meds.  Hemovac drain was pulled without difficulty.  Continued to progress with therapy into day two walking 40 to 50 feet.  Dressing was changed on day two and the incision was healing well.  By day three, the patient continued to progress with therapy but developed hypotension.  Incision was healing well but she was kept to due pressure issues.  Patient was seen in rounds on day four and was doing better and then was ready to go home.    Discharge Medications: Prior to Admission medications   Medication Sig Start Date End Date Taking? Authorizing Provider  amLODipine-benazepril (LOTREL) 5-40 MG per capsule Take 1 capsule by mouth every morning.   Yes Historical Provider, MD  atenolol-chlorthalidone (TENORETIC) 50-25 MG per tablet Take 1 tablet by mouth every morning.  Yes Historical Provider, MD  colchicine 0.6 MG tablet Take 0.6 mg by mouth every morning.   Yes Historical Provider, MD  dorzolamide-timolol (COSOPT) 22.3-6.8 MG/ML ophthalmic solution Place 1 drop into both  eyes 2 (two) times daily.   Yes Historical Provider, MD  pantoprazole (PROTONIX) 40 MG tablet Take 40 mg by mouth every morning.   Yes Historical Provider, MD  EPINEPHrine (EPI-PEN) 0.3 mg/0.3 mL DEVI Inject 0.3 mg into the muscle as needed. For allergy to Latex    Historical Provider, MD  methocarbamol (ROBAXIN) 500 MG tablet Take 1 tablet (500 mg total) by mouth every 6 (six) hours as needed. 02/15/12 02/25/12  Adelis Docter, PA  oxyCODONE (OXY IR/ROXICODONE) 5 MG immediate release tablet Take 1-2 tablets (5-10 mg total) by mouth every 4 (four) hours as needed for pain. 02/15/12 02/25/12  Lennix Rotundo Julien Girt, PA  rivaroxaban (XARELTO) 10 MG TABS tablet Take 1 tablet (10 mg total) by mouth daily with breakfast. Take for two and a a half more weeks and then discontinue the Xarelto. 02/15/12   Annica Marinello Julien Girt, PA  traMADol (ULTRAM) 50 MG tablet Take 1-2 tablets (50-100 mg total) by mouth every 6 (six) hours as needed (for mild pain). 02/15/12 02/25/12  Anastazja Isaac, PA    Diet: regular Activity:WBAT Follow-up:in 2 weeks Disposition: Home Discharged Condition: fair   Discharge Orders    Future Appointments: Provider: Department: Dept Phone: Center:   06-Oct-202013 11:00 AM Krista Blue [facility] [phone] None   04/16/2012 11:30 AM Lowella Dell, MD [facility] [phone] None     Future Orders Please Complete By Expires   Diet - low sodium heart healthy      Call MD / Call 911      Comments:   If you experience chest pain or shortness of breath, CALL 911 and be transported to the hospital emergency room.  If you develope a fever above 101 F, pus (white drainage) or increased drainage or redness at the wound, or calf pain, call your surgeon's office.   Constipation Prevention      Comments:   Drink plenty of fluids.  Prune juice may be helpful.  You may use a stool softener, such as Colace (over the counter) 100 mg twice a day.  Use MiraLax (over the counter) for  constipation as needed.   Increase activity slowly as tolerated      Weight Bearing as taught in Physical Therapy      Comments:   Use a walker or crutches as instructed.   Discharge instructions      Comments:   Pick up stool softner and laxative for home. Do not submerge incision under water. May shower. Continue to use ice for pain and swelling from surgery.    Driving restrictions      Comments:   No driving   Lifting restrictions      Comments:   No lifting     Medication List  As of 02/22/2012  9:10 PM   STOP taking these medications         acetaminophen 500 MG tablet      CALCIUM + D 250-125 MG-UNIT per tablet      cholecalciferol 1000 UNITS tablet      diclofenac 75 MG EC tablet      glucosamine-chondroitin 500-400 MG tablet      mulitivitamin with minerals Tabs         TAKE these medications  amLODipine-benazepril 5-40 MG per capsule   Commonly known as: LOTREL   Take 1 capsule by mouth every morning.      atenolol-chlorthalidone 50-25 MG per tablet   Commonly known as: TENORETIC   Take 1 tablet by mouth every morning.      colchicine 0.6 MG tablet   Take 0.6 mg by mouth every morning.      dorzolamide-timolol 22.3-6.8 MG/ML ophthalmic solution   Commonly known as: COSOPT   Place 1 drop into both eyes 2 (two) times daily.      EPINEPHrine 0.3 mg/0.3 mL Devi   Commonly known as: EPI-PEN   Inject 0.3 mg into the muscle as needed. For allergy to Latex      methocarbamol 500 MG tablet   Commonly known as: ROBAXIN   Take 1 tablet (500 mg total) by mouth every 6 (six) hours as needed.      oxyCODONE 5 MG immediate release tablet   Commonly known as: Oxy IR/ROXICODONE   Take 1-2 tablets (5-10 mg total) by mouth every 4 (four) hours as needed for pain.      pantoprazole 40 MG tablet   Commonly known as: PROTONIX   Take 40 mg by mouth every morning.      rivaroxaban 10 MG Tabs tablet   Commonly known as: XARELTO   Take 1 tablet (10 mg  total) by mouth daily with breakfast. Take for two and a a half more weeks and then discontinue the Xarelto.      traMADol 50 MG tablet   Commonly known as: ULTRAM   Take 1-2 tablets (50-100 mg total) by mouth every 6 (six) hours as needed (for mild pain).           Follow-up Information    Follow up with Loanne Drilling, MD. Schedule an appointment as soon as possible for a visit in 2 weeks.   Contact information:   [facility] [address] [place] [address] [phone]          Signed: Patrica Duel 02/22/2012, 9:10 PM

## 2012-02-23 DIAGNOSIS — I739 Peripheral vascular disease, unspecified: Secondary | ICD-10-CM | POA: Diagnosis not present

## 2012-02-23 DIAGNOSIS — M069 Rheumatoid arthritis, unspecified: Secondary | ICD-10-CM | POA: Diagnosis not present

## 2012-02-23 DIAGNOSIS — IMO0001 Reserved for inherently not codable concepts without codable children: Secondary | ICD-10-CM | POA: Diagnosis not present

## 2012-02-23 DIAGNOSIS — Z471 Aftercare following joint replacement surgery: Secondary | ICD-10-CM | POA: Diagnosis not present

## 2012-02-23 DIAGNOSIS — Z96659 Presence of unspecified artificial knee joint: Secondary | ICD-10-CM | POA: Diagnosis not present

## 2012-02-26 DIAGNOSIS — Z96659 Presence of unspecified artificial knee joint: Secondary | ICD-10-CM | POA: Diagnosis not present

## 2012-02-26 DIAGNOSIS — Z471 Aftercare following joint replacement surgery: Secondary | ICD-10-CM | POA: Diagnosis not present

## 2012-02-26 DIAGNOSIS — I739 Peripheral vascular disease, unspecified: Secondary | ICD-10-CM | POA: Diagnosis not present

## 2012-02-26 DIAGNOSIS — IMO0001 Reserved for inherently not codable concepts without codable children: Secondary | ICD-10-CM | POA: Diagnosis not present

## 2012-02-26 DIAGNOSIS — M069 Rheumatoid arthritis, unspecified: Secondary | ICD-10-CM | POA: Diagnosis not present

## 2012-02-27 DIAGNOSIS — Z96659 Presence of unspecified artificial knee joint: Secondary | ICD-10-CM | POA: Diagnosis not present

## 2012-02-27 DIAGNOSIS — M069 Rheumatoid arthritis, unspecified: Secondary | ICD-10-CM | POA: Diagnosis not present

## 2012-02-27 DIAGNOSIS — Z471 Aftercare following joint replacement surgery: Secondary | ICD-10-CM | POA: Diagnosis not present

## 2012-02-27 DIAGNOSIS — I739 Peripheral vascular disease, unspecified: Secondary | ICD-10-CM | POA: Diagnosis not present

## 2012-02-27 DIAGNOSIS — IMO0001 Reserved for inherently not codable concepts without codable children: Secondary | ICD-10-CM | POA: Diagnosis not present

## 2012-02-28 DIAGNOSIS — I739 Peripheral vascular disease, unspecified: Secondary | ICD-10-CM | POA: Diagnosis not present

## 2012-02-28 DIAGNOSIS — M069 Rheumatoid arthritis, unspecified: Secondary | ICD-10-CM | POA: Diagnosis not present

## 2012-02-28 DIAGNOSIS — IMO0001 Reserved for inherently not codable concepts without codable children: Secondary | ICD-10-CM | POA: Diagnosis not present

## 2012-02-28 DIAGNOSIS — Z471 Aftercare following joint replacement surgery: Secondary | ICD-10-CM | POA: Diagnosis not present

## 2012-02-28 DIAGNOSIS — Z96659 Presence of unspecified artificial knee joint: Secondary | ICD-10-CM | POA: Diagnosis not present

## 2012-02-29 DIAGNOSIS — I739 Peripheral vascular disease, unspecified: Secondary | ICD-10-CM | POA: Diagnosis not present

## 2012-02-29 DIAGNOSIS — Z96659 Presence of unspecified artificial knee joint: Secondary | ICD-10-CM | POA: Diagnosis not present

## 2012-02-29 DIAGNOSIS — Z471 Aftercare following joint replacement surgery: Secondary | ICD-10-CM | POA: Diagnosis not present

## 2012-02-29 DIAGNOSIS — IMO0001 Reserved for inherently not codable concepts without codable children: Secondary | ICD-10-CM | POA: Diagnosis not present

## 2012-02-29 DIAGNOSIS — M069 Rheumatoid arthritis, unspecified: Secondary | ICD-10-CM | POA: Diagnosis not present

## 2012-03-01 DIAGNOSIS — Z96659 Presence of unspecified artificial knee joint: Secondary | ICD-10-CM | POA: Diagnosis not present

## 2012-03-01 DIAGNOSIS — Z471 Aftercare following joint replacement surgery: Secondary | ICD-10-CM | POA: Diagnosis not present

## 2012-03-01 DIAGNOSIS — I739 Peripheral vascular disease, unspecified: Secondary | ICD-10-CM | POA: Diagnosis not present

## 2012-03-01 DIAGNOSIS — IMO0001 Reserved for inherently not codable concepts without codable children: Secondary | ICD-10-CM | POA: Diagnosis not present

## 2012-03-01 DIAGNOSIS — M069 Rheumatoid arthritis, unspecified: Secondary | ICD-10-CM | POA: Diagnosis not present

## 2012-03-04 DIAGNOSIS — Z96659 Presence of unspecified artificial knee joint: Secondary | ICD-10-CM | POA: Diagnosis not present

## 2012-03-04 DIAGNOSIS — IMO0001 Reserved for inherently not codable concepts without codable children: Secondary | ICD-10-CM | POA: Diagnosis not present

## 2012-03-04 DIAGNOSIS — Z471 Aftercare following joint replacement surgery: Secondary | ICD-10-CM | POA: Diagnosis not present

## 2012-03-04 DIAGNOSIS — I739 Peripheral vascular disease, unspecified: Secondary | ICD-10-CM | POA: Diagnosis not present

## 2012-03-04 DIAGNOSIS — M069 Rheumatoid arthritis, unspecified: Secondary | ICD-10-CM | POA: Diagnosis not present

## 2012-03-06 DIAGNOSIS — M069 Rheumatoid arthritis, unspecified: Secondary | ICD-10-CM | POA: Diagnosis not present

## 2012-03-06 DIAGNOSIS — IMO0001 Reserved for inherently not codable concepts without codable children: Secondary | ICD-10-CM | POA: Diagnosis not present

## 2012-03-06 DIAGNOSIS — Z96659 Presence of unspecified artificial knee joint: Secondary | ICD-10-CM | POA: Diagnosis not present

## 2012-03-06 DIAGNOSIS — Z471 Aftercare following joint replacement surgery: Secondary | ICD-10-CM | POA: Diagnosis not present

## 2012-03-06 DIAGNOSIS — I739 Peripheral vascular disease, unspecified: Secondary | ICD-10-CM | POA: Diagnosis not present

## 2012-03-08 DIAGNOSIS — Z471 Aftercare following joint replacement surgery: Secondary | ICD-10-CM | POA: Diagnosis not present

## 2012-03-08 DIAGNOSIS — IMO0001 Reserved for inherently not codable concepts without codable children: Secondary | ICD-10-CM | POA: Diagnosis not present

## 2012-03-08 DIAGNOSIS — I739 Peripheral vascular disease, unspecified: Secondary | ICD-10-CM | POA: Diagnosis not present

## 2012-03-08 DIAGNOSIS — Z96659 Presence of unspecified artificial knee joint: Secondary | ICD-10-CM | POA: Diagnosis not present

## 2012-03-08 DIAGNOSIS — M069 Rheumatoid arthritis, unspecified: Secondary | ICD-10-CM | POA: Diagnosis not present

## 2012-03-11 DIAGNOSIS — M1710 Unilateral primary osteoarthritis, unspecified knee: Secondary | ICD-10-CM | POA: Diagnosis not present

## 2012-03-14 DIAGNOSIS — M1710 Unilateral primary osteoarthritis, unspecified knee: Secondary | ICD-10-CM | POA: Diagnosis not present

## 2012-03-18 DIAGNOSIS — M1710 Unilateral primary osteoarthritis, unspecified knee: Secondary | ICD-10-CM | POA: Diagnosis not present

## 2012-03-21 DIAGNOSIS — Z79899 Other long term (current) drug therapy: Secondary | ICD-10-CM | POA: Diagnosis not present

## 2012-03-21 DIAGNOSIS — M1990 Unspecified osteoarthritis, unspecified site: Secondary | ICD-10-CM | POA: Diagnosis not present

## 2012-03-21 DIAGNOSIS — M2550 Pain in unspecified joint: Secondary | ICD-10-CM | POA: Diagnosis not present

## 2012-03-22 DIAGNOSIS — M1710 Unilateral primary osteoarthritis, unspecified knee: Secondary | ICD-10-CM | POA: Diagnosis not present

## 2012-03-25 DIAGNOSIS — M1710 Unilateral primary osteoarthritis, unspecified knee: Secondary | ICD-10-CM | POA: Diagnosis not present

## 2012-03-26 DIAGNOSIS — M1710 Unilateral primary osteoarthritis, unspecified knee: Secondary | ICD-10-CM | POA: Diagnosis not present

## 2012-03-27 DIAGNOSIS — I10 Essential (primary) hypertension: Secondary | ICD-10-CM | POA: Diagnosis not present

## 2012-03-29 DIAGNOSIS — M1710 Unilateral primary osteoarthritis, unspecified knee: Secondary | ICD-10-CM | POA: Diagnosis not present

## 2012-04-01 DIAGNOSIS — M1710 Unilateral primary osteoarthritis, unspecified knee: Secondary | ICD-10-CM | POA: Diagnosis not present

## 2012-04-05 DIAGNOSIS — M1710 Unilateral primary osteoarthritis, unspecified knee: Secondary | ICD-10-CM | POA: Diagnosis not present

## 2012-04-09 ENCOUNTER — Other Ambulatory Visit: Payer: Medicare Other | Admitting: Lab

## 2012-04-09 DIAGNOSIS — M1710 Unilateral primary osteoarthritis, unspecified knee: Secondary | ICD-10-CM | POA: Diagnosis not present

## 2012-04-12 DIAGNOSIS — M1710 Unilateral primary osteoarthritis, unspecified knee: Secondary | ICD-10-CM | POA: Diagnosis not present

## 2012-04-16 ENCOUNTER — Ambulatory Visit: Payer: Medicare Other | Admitting: Oncology

## 2012-04-16 DIAGNOSIS — M1710 Unilateral primary osteoarthritis, unspecified knee: Secondary | ICD-10-CM | POA: Diagnosis not present

## 2012-04-19 DIAGNOSIS — M1710 Unilateral primary osteoarthritis, unspecified knee: Secondary | ICD-10-CM | POA: Diagnosis not present

## 2012-04-23 DIAGNOSIS — M1710 Unilateral primary osteoarthritis, unspecified knee: Secondary | ICD-10-CM | POA: Diagnosis not present

## 2012-04-26 DIAGNOSIS — M1710 Unilateral primary osteoarthritis, unspecified knee: Secondary | ICD-10-CM | POA: Diagnosis not present

## 2012-04-26 DIAGNOSIS — M2550 Pain in unspecified joint: Secondary | ICD-10-CM | POA: Diagnosis not present

## 2012-04-26 DIAGNOSIS — M064 Inflammatory polyarthropathy: Secondary | ICD-10-CM | POA: Diagnosis not present

## 2012-04-26 DIAGNOSIS — M1990 Unspecified osteoarthritis, unspecified site: Secondary | ICD-10-CM | POA: Diagnosis not present

## 2012-04-29 ENCOUNTER — Telehealth: Payer: Self-pay | Admitting: Oncology

## 2012-04-29 NOTE — Telephone Encounter (Signed)
S/w the pt and she is aware of her appt time change on 05/01/2012 with the pa instead of the md

## 2012-05-01 ENCOUNTER — Encounter: Payer: Self-pay | Admitting: Physician Assistant

## 2012-05-01 ENCOUNTER — Telehealth: Payer: Self-pay | Admitting: Oncology

## 2012-05-01 ENCOUNTER — Ambulatory Visit (HOSPITAL_BASED_OUTPATIENT_CLINIC_OR_DEPARTMENT_OTHER): Payer: Medicare Other | Admitting: Physician Assistant

## 2012-05-01 ENCOUNTER — Ambulatory Visit (HOSPITAL_BASED_OUTPATIENT_CLINIC_OR_DEPARTMENT_OTHER): Payer: Medicare Other | Admitting: Lab

## 2012-05-01 VITALS — BP 157/79 | HR 69 | Temp 97.7°F | Ht 62.0 in | Wt 123.0 lb

## 2012-05-01 DIAGNOSIS — D72829 Elevated white blood cell count, unspecified: Secondary | ICD-10-CM

## 2012-05-01 DIAGNOSIS — Z85118 Personal history of other malignant neoplasm of bronchus and lung: Secondary | ICD-10-CM | POA: Diagnosis not present

## 2012-05-01 DIAGNOSIS — C3490 Malignant neoplasm of unspecified part of unspecified bronchus or lung: Secondary | ICD-10-CM

## 2012-05-01 DIAGNOSIS — Z853 Personal history of malignant neoplasm of breast: Secondary | ICD-10-CM | POA: Insufficient documentation

## 2012-05-01 DIAGNOSIS — C3491 Malignant neoplasm of unspecified part of right bronchus or lung: Secondary | ICD-10-CM | POA: Insufficient documentation

## 2012-05-01 DIAGNOSIS — Z1231 Encounter for screening mammogram for malignant neoplasm of breast: Secondary | ICD-10-CM

## 2012-05-01 DIAGNOSIS — M2550 Pain in unspecified joint: Secondary | ICD-10-CM | POA: Insufficient documentation

## 2012-05-01 DIAGNOSIS — M1710 Unilateral primary osteoarthritis, unspecified knee: Secondary | ICD-10-CM | POA: Diagnosis not present

## 2012-05-01 LAB — CBC WITH DIFFERENTIAL/PLATELET
Basophils Absolute: 0.1 10*3/uL (ref 0.0–0.1)
Eosinophils Absolute: 0.5 10*3/uL (ref 0.0–0.5)
HCT: 35.9 % (ref 34.8–46.6)
HGB: 12.1 g/dL (ref 11.6–15.9)
MCV: 91 fL (ref 79.5–101.0)
MONO%: 8.6 % (ref 0.0–14.0)
NEUT#: 4.1 10*3/uL (ref 1.5–6.5)
Platelets: 440 10*3/uL — ABNORMAL HIGH (ref 145–400)
RDW: 13.7 % (ref 11.2–14.5)

## 2012-05-01 NOTE — Telephone Encounter (Signed)
gve the pt her June 2014 appt calendar along with the cxr referral with the mammo appt in march. Pt is aware that she will be called with the appt for the appt with dr Freida Busman at [facility]

## 2012-05-01 NOTE — Progress Notes (Signed)
ID: Karen Kaiser   DOB: 10/30/1935  MR#: [id number]  BJY#:[id number]  HISTORY OF PRESENT ILLNESS: Patient has a remote history of DCIS, and is status post right mastectomy in 1998. She was then diagnosed with lung adenocarcinoma and underwent a right lower lobectomy in April 2004 for 1.9 cm mass. There was no lymph node involvement. Margins were negative. There is no evidence of recurrence thus far, and the patient is followed with observation alone.  Comorbidities include osteoarthritis, and rheumatoid arthritis.  INTERVAL HISTORY: Karen Kaiser returns today for routine one-year followup of her history of breast cancer and lung cancer. Interval history is remarkable for her having undergone a left knee replacement earlier this year. She tolerated the procedure well and is recovering well, still attending physical therapy. The pain is significantly improved.  Otherwise, however, Karen Kaiser has also been diagnosed with rheumatoid arthritis and is following with Dr. Nickola Major in rheumatology. Karen Kaiser continues to have diffuse joint pains, but this seems to especially affecting her hands in her wrists, right greater than the left. They have discussed the possibility of initiating methotrexate, but Karen Kaiser is very hesitant due to a possible association with lymphomas. Really, the joint pain associated with the RA is her biggest complaint and concern today.  REVIEW OF SYSTEMS: Otherwise, Karen Kaiser has had no recent illnesses. She denies fevers, chills, drenching night sweats, enlarged lymph nodes, or unexplained weight loss. She's had no signs of abnormal bleeding or abnormal clotting. No nausea or recent change in bowel habits. No cough, phlegm production, pleurisy, or shortness of breath. No chest pain or palpitations. No abnormal headaches or dizziness.  A detailed review of systems is otherwise noncontributory.  Or PAST MEDICAL HISTORY: Past Medical History  Diagnosis Date  . PONV (postoperative nausea and  vomiting)   . Hypertension   . Heart murmur     as a child   . Peripheral vascular disease     varciose veins in right leg   . Recurrent upper respiratory infection (URI)     sinus infection 1/13 2/13 for tx   . GERD (gastroesophageal reflux disease)   . Chronic kidney disease     urgency and stress incontinence   . Arthritis     generalized arthrtis   . Cancer     breast mastectomy - 1996 lung ca 2004- RLL  . Glaucoma     PAST SURGICAL HISTORY: Past Surgical History  Procedure Date  . Mastectomy     right   . Lobectomy     RLL   . Other surgical history     right rotator cuff surgery   . Eye surgery     bilateral cataract srugery   . Breast biopsy     left breast biopsy 2006   . Dilation and curettage of uterus   . Total knee arthroplasty 02/12/2012    Procedure: TOTAL KNEE ARTHROPLASTY;  Surgeon: Loanne Drilling, MD;  Location: [facility];  Service: Orthopedics;  Laterality: Right;    FAMILY HISTORY No family history on file. Cancer. sister (ovarian, breast) and brother (lung and brain)  Cerebrovascular Accident. mother and father  Diabetes Mellitus. mother and sister  Hypertension. mother, sister and child  Osteoarthritis. mother  Father. deceased age 67, stroke  Mother. deceased age 25, stroke. DM, HTN  GYNECOLOGIC HISTORY:  SOCIAL HISTORY: Patient is married and lives with her spouse, Onalee Hua. She is currently retired.   ADVANCED DIRECTIVES:  HEALTH MAINTENANCE: History  Substance Use Topics  . Smoking status: Never  Smoker   . Smokeless tobacco: Never Used  . Alcohol Use: No     Colonoscopy: Due in Oct 2013  PAP:  UTD  Bone density:  March 2013, osteoporosis  Lipid panel:  UTD, Dr. Allie Dimmer  Allergies  Allergen Reactions  . Amlodipine Besy-Benazepril Hcl Swelling    10mg  or higher?? Per patient  . Latex Other (See Comments)    Blister/itching/swelling  . Xalatan (Latanoprost) Itching    Current Outpatient Prescriptions  Medication Sig Dispense  Refill  . amLODipine-benazepril (LOTREL) 5-40 MG per capsule Take 1 capsule by mouth every morning.      Marland Kitchen atenolol-chlorthalidone (TENORETIC) 50-25 MG per tablet Take 1 tablet by mouth every morning.      . calcium-vitamin D (OSCAL WITH D) 500-200 MG-UNIT per tablet Take 1 tablet by mouth daily.      . cholecalciferol (VITAMIN D) 1000 UNITS tablet Take 1,000 Units by mouth daily.      . colchicine 0.6 MG tablet Take 0.6 mg by mouth 2 (two) times daily.       Marland Kitchen docusate sodium (COLACE) 100 MG capsule Take 100 mg by mouth 2 (two) times daily as needed.      . dorzolamide-timolol (COSOPT) 22.3-6.8 MG/ML ophthalmic solution Place 1 drop into both eyes 2 (two) times daily.      Marland Kitchen EPINEPHrine (EPI-PEN) 0.3 mg/0.3 mL DEVI Inject 0.3 mg into the muscle as needed. For allergy to Latex      . glucosamine-chondroitin 500-400 MG tablet Take 1 tablet by mouth 3 (three) times daily.      Marland Kitchen ibuprofen (ADVIL,MOTRIN) 800 MG tablet Take 800 mg by mouth 3 (three) times daily.      . Multiple Vitamin (MULTIVITAMIN) tablet Take 1 tablet by mouth daily.      . pantoprazole (PROTONIX) 40 MG tablet Take 40 mg by mouth every morning.      . traMADol (ULTRAM) 50 MG tablet Take 50 mg by mouth every 6 (six) hours as needed.        OBJECTIVE: Filed Vitals:   05/01/12 1416  BP: 157/79  Pulse: 69  Temp: 97.7 F (36.5 C)     Body mass index is 22.50 kg/(m^2).    ECOG FS: 1  Filed Weights   05/01/12 1416  Weight: 123 lb (55.792 kg)   Physical Exam: HEENT:  Sclerae anicteric, conjunctivae pink.  Oropharynx clear.     Nodes:  No cervical, supraclavicular, or axillary lymphadenopathy palpated.  Breast Exam:  Patient is status post right mastectomy, with no suspicious nodularity or skin changes, and no evidence of local recurrence in the chest wall. Left breast is status post lumpectomy with well-healed incision. No suspicious nodules or skin changes. There is some nipple inversion which is chronic and unchanged.     Lungs:  Clear to auscultation bilaterally.  No crackles, rhonchi, or wheezes.   Heart:  Regular rate and rhythm.   Abdomen:  Soft, nontender.  Positive bowel sounds.  No organomegaly or masses palpated.   Musculoskeletal:  No focal spinal tenderness to palpation.  Extremities:  Benign.  No peripheral edema or cyanosis.   Skin:  Benign.   Neuro:  Nonfocal. Alert and oriented x3.    LAB RESULTS: Lab Results  Component Value Date   WBC 9.9 05/01/2012   NEUTROABS 4.1 05/01/2012   HGB 12.1 05/01/2012   HCT 35.9 05/01/2012   MCV 91.0 05/01/2012   PLT 440* 05/01/2012      Chemistry  Component Value Date/Time   NA 133* 02/16/2012 0435   K 3.5 02/16/2012 0435   CL 98 02/16/2012 0435   CO2 27 02/16/2012 0435   BUN 12 02/16/2012 0435   CREATININE 0.71 02/16/2012 0435      Component Value Date/Time   CALCIUM 8.8 02/16/2012 0435   ALKPHOS 68 02/02/2012 1515   AST 17 02/02/2012 1515   ALT 19 02/02/2012 1515   BILITOT 0.4 02/02/2012 1515       STUDIES:  02/02/2012 CHEST - 2 VIEW  Comparison: 04/13/2011  Findings: The lungs are hyperinflated. Heart size is normal. No  focal consolidations or pleural effusions are identified. No  edema. Postoperative changes are identified at the right  infrahilar region. Visualized osseous structures have a normal  appearance.  IMPRESSION:  No evidence for acute cardiopulmonary abnormality.  Original Report Authenticated By: Patterson Hammersmith, M.D.     02/08/2012 BONE DENSITY: [facility], showing continued osteoporosis.   02/09/2012 DIGITAL SCREENING MAMMOGRAM WITH CAD:  There are scattered fibroglandular densities. No masses or malignant type calcifications are  identified. Compared with prior studies.  Images were processed with CAD.  IMPRESSION:  No specific mammographic evidence of malignancy. Next screening mammogram is recommended in one  year.  A result letter of this screening  mammogram will be mailed directly to the patient.  ASSESSMENT: Benign - BI-RADS 2  Screening mammogram in 1 year.  Addendum  CORRECTION: The report is corrected to state Dr. Carmelina Noun as the interpreting radiologist.  ,    ASSESSMENT: 77 y.o. [place] woman status post: 1. Right lower lobectomy April 2004 for a 1.9-cm lung adenocarcinoma, with no lymph node involvement, negative margins, and no evidence of recurrence so far. 2. Status post right mastectomy in 1998 for ductal carcinoma in situ. 3      She had a left ductectomy February 2005 for what          proved to be benign changes.   PLAN: Karen Kaiser seems to be doing quite well, certainly with regards to her histories of breast cancer and lung cancer. She'll continue to be followed by her rheumatologist for possible rheumatoid arthritis, and in fact is thinking about getting a second opinion which might certainly be helpful.  She cannot decide how she feels about being treated with methotrexate. She is still very concerned about its possible side effects and risks. Dr. Darnelle Catalan has suggested Dr. Willette Pa at [facility] [phone]) for a second opinion.  Karen Kaiser was also a little worried about her elevated white count as noted on recent CBC at Dr. Nickola Major office. We repeated her CBC, and in fact her total white blood cell count was normal at 9.9 today. Really, her CBC appears very stable when compared to past labs and the slightly elevated lymphocytes are likely  attributed to the inflammation/rheumatoid arthritis.  Kamerin will proceed to screening mammogram in March, and will have a repeat chest x-ray in early June of next year. This will be followed by her annual visit with Dr. Darnelle Catalan. She knows to call, however, in the meanwhile if any changes or problems.  Karen Kaiser    05/01/2012

## 2012-05-08 DIAGNOSIS — M1710 Unilateral primary osteoarthritis, unspecified knee: Secondary | ICD-10-CM | POA: Diagnosis not present

## 2012-05-10 DIAGNOSIS — M1710 Unilateral primary osteoarthritis, unspecified knee: Secondary | ICD-10-CM | POA: Diagnosis not present

## 2012-05-15 DIAGNOSIS — M1710 Unilateral primary osteoarthritis, unspecified knee: Secondary | ICD-10-CM | POA: Diagnosis not present

## 2012-05-30 DIAGNOSIS — M1990 Unspecified osteoarthritis, unspecified site: Secondary | ICD-10-CM | POA: Diagnosis not present

## 2012-05-30 DIAGNOSIS — M064 Inflammatory polyarthropathy: Secondary | ICD-10-CM | POA: Diagnosis not present

## 2012-05-30 DIAGNOSIS — Z79899 Other long term (current) drug therapy: Secondary | ICD-10-CM | POA: Diagnosis not present

## 2012-05-30 DIAGNOSIS — M2550 Pain in unspecified joint: Secondary | ICD-10-CM | POA: Diagnosis not present

## 2012-06-26 DIAGNOSIS — Z23 Encounter for immunization: Secondary | ICD-10-CM | POA: Diagnosis not present

## 2012-06-26 DIAGNOSIS — R351 Nocturia: Secondary | ICD-10-CM | POA: Diagnosis not present

## 2012-06-26 DIAGNOSIS — I10 Essential (primary) hypertension: Secondary | ICD-10-CM | POA: Diagnosis not present

## 2012-07-31 DIAGNOSIS — M1990 Unspecified osteoarthritis, unspecified site: Secondary | ICD-10-CM | POA: Diagnosis not present

## 2012-07-31 DIAGNOSIS — M064 Inflammatory polyarthropathy: Secondary | ICD-10-CM | POA: Diagnosis not present

## 2012-07-31 DIAGNOSIS — M2550 Pain in unspecified joint: Secondary | ICD-10-CM | POA: Diagnosis not present

## 2012-07-31 DIAGNOSIS — Z79899 Other long term (current) drug therapy: Secondary | ICD-10-CM | POA: Diagnosis not present

## 2012-08-06 DIAGNOSIS — M1710 Unilateral primary osteoarthritis, unspecified knee: Secondary | ICD-10-CM | POA: Diagnosis not present

## 2012-08-20 DIAGNOSIS — Z23 Encounter for immunization: Secondary | ICD-10-CM | POA: Diagnosis not present

## 2012-08-20 DIAGNOSIS — M064 Inflammatory polyarthropathy: Secondary | ICD-10-CM | POA: Diagnosis not present

## 2012-08-22 ENCOUNTER — Other Ambulatory Visit: Payer: Self-pay | Admitting: Dermatology

## 2012-08-22 DIAGNOSIS — R238 Other skin changes: Secondary | ICD-10-CM | POA: Diagnosis not present

## 2012-08-22 DIAGNOSIS — L259 Unspecified contact dermatitis, unspecified cause: Secondary | ICD-10-CM | POA: Diagnosis not present

## 2012-08-22 DIAGNOSIS — L129 Pemphigoid, unspecified: Secondary | ICD-10-CM | POA: Diagnosis not present

## 2012-08-22 DIAGNOSIS — L089 Local infection of the skin and subcutaneous tissue, unspecified: Secondary | ICD-10-CM | POA: Diagnosis not present

## 2012-09-04 DIAGNOSIS — R198 Other specified symptoms and signs involving the digestive system and abdomen: Secondary | ICD-10-CM | POA: Diagnosis not present

## 2012-09-04 DIAGNOSIS — Z1211 Encounter for screening for malignant neoplasm of colon: Secondary | ICD-10-CM | POA: Diagnosis not present

## 2012-09-04 DIAGNOSIS — K219 Gastro-esophageal reflux disease without esophagitis: Secondary | ICD-10-CM | POA: Diagnosis not present

## 2012-09-17 DIAGNOSIS — R21 Rash and other nonspecific skin eruption: Secondary | ICD-10-CM | POA: Diagnosis not present

## 2012-09-25 DIAGNOSIS — Z1211 Encounter for screening for malignant neoplasm of colon: Secondary | ICD-10-CM | POA: Diagnosis not present

## 2012-10-10 DIAGNOSIS — K921 Melena: Secondary | ICD-10-CM | POA: Diagnosis not present

## 2012-10-10 DIAGNOSIS — I10 Essential (primary) hypertension: Secondary | ICD-10-CM | POA: Diagnosis not present

## 2012-10-10 DIAGNOSIS — M064 Inflammatory polyarthropathy: Secondary | ICD-10-CM | POA: Diagnosis not present

## 2012-10-10 DIAGNOSIS — R21 Rash and other nonspecific skin eruption: Secondary | ICD-10-CM | POA: Diagnosis not present

## 2012-10-14 DIAGNOSIS — M159 Polyosteoarthritis, unspecified: Secondary | ICD-10-CM | POA: Diagnosis not present

## 2012-10-14 DIAGNOSIS — H401390 Pigmentary glaucoma, unspecified eye, stage unspecified: Secondary | ICD-10-CM | POA: Diagnosis not present

## 2012-10-14 DIAGNOSIS — M130 Polyarthritis, unspecified: Secondary | ICD-10-CM | POA: Diagnosis not present

## 2012-10-14 DIAGNOSIS — M79609 Pain in unspecified limb: Secondary | ICD-10-CM | POA: Diagnosis not present

## 2012-11-01 DIAGNOSIS — M069 Rheumatoid arthritis, unspecified: Secondary | ICD-10-CM | POA: Diagnosis not present

## 2012-11-01 DIAGNOSIS — M159 Polyosteoarthritis, unspecified: Secondary | ICD-10-CM | POA: Diagnosis not present

## 2012-11-07 DIAGNOSIS — K921 Melena: Secondary | ICD-10-CM | POA: Diagnosis not present

## 2013-01-06 DIAGNOSIS — M159 Polyosteoarthritis, unspecified: Secondary | ICD-10-CM | POA: Diagnosis not present

## 2013-01-06 DIAGNOSIS — M069 Rheumatoid arthritis, unspecified: Secondary | ICD-10-CM | POA: Diagnosis not present

## 2013-01-13 DIAGNOSIS — I10 Essential (primary) hypertension: Secondary | ICD-10-CM | POA: Diagnosis not present

## 2013-02-10 ENCOUNTER — Ambulatory Visit
Admission: RE | Admit: 2013-02-10 | Discharge: 2013-02-10 | Disposition: A | Discharge status: Home / Self Care | Payer: Medicare Other | Source: Ambulatory Visit | Attending: Physician Assistant | Admitting: Physician Assistant

## 2013-02-10 DIAGNOSIS — Z1231 Encounter for screening mammogram for malignant neoplasm of breast: Secondary | ICD-10-CM | POA: Diagnosis not present

## 2013-02-11 DIAGNOSIS — M1710 Unilateral primary osteoarthritis, unspecified knee: Secondary | ICD-10-CM | POA: Diagnosis not present

## 2013-03-12 DIAGNOSIS — H401390 Pigmentary glaucoma, unspecified eye, stage unspecified: Secondary | ICD-10-CM | POA: Diagnosis not present

## 2013-03-13 DIAGNOSIS — M159 Polyosteoarthritis, unspecified: Secondary | ICD-10-CM | POA: Diagnosis not present

## 2013-03-13 DIAGNOSIS — M069 Rheumatoid arthritis, unspecified: Secondary | ICD-10-CM | POA: Diagnosis not present

## 2013-03-13 DIAGNOSIS — M25539 Pain in unspecified wrist: Secondary | ICD-10-CM | POA: Diagnosis not present

## 2013-04-17 DIAGNOSIS — I10 Essential (primary) hypertension: Secondary | ICD-10-CM | POA: Diagnosis not present

## 2013-04-22 ENCOUNTER — Other Ambulatory Visit: Payer: Medicare Other | Admitting: Lab

## 2013-04-22 ENCOUNTER — Ambulatory Visit (HOSPITAL_COMMUNITY)
Admission: RE | Admit: 2013-04-22 | Discharge: 2013-04-22 | Disposition: A | Discharge status: Home / Self Care | Payer: Medicare Other | Source: Ambulatory Visit | Attending: Physician Assistant | Admitting: Physician Assistant

## 2013-04-22 DIAGNOSIS — D72829 Elevated white blood cell count, unspecified: Secondary | ICD-10-CM

## 2013-04-22 DIAGNOSIS — Z85118 Personal history of other malignant neoplasm of bronchus and lung: Secondary | ICD-10-CM | POA: Diagnosis not present

## 2013-04-22 DIAGNOSIS — C3490 Malignant neoplasm of unspecified part of unspecified bronchus or lung: Secondary | ICD-10-CM

## 2013-04-22 DIAGNOSIS — Z853 Personal history of malignant neoplasm of breast: Secondary | ICD-10-CM | POA: Diagnosis not present

## 2013-04-22 DIAGNOSIS — R918 Other nonspecific abnormal finding of lung field: Secondary | ICD-10-CM | POA: Diagnosis not present

## 2013-04-29 ENCOUNTER — Ambulatory Visit (HOSPITAL_BASED_OUTPATIENT_CLINIC_OR_DEPARTMENT_OTHER): Payer: Medicare Other | Admitting: Oncology

## 2013-04-29 VITALS — BP 157/78 | HR 65 | Temp 97.6°F | Resp 20 | Ht 62.0 in | Wt 124.4 lb

## 2013-04-29 DIAGNOSIS — C3490 Malignant neoplasm of unspecified part of unspecified bronchus or lung: Secondary | ICD-10-CM | POA: Diagnosis not present

## 2013-04-29 DIAGNOSIS — Z853 Personal history of malignant neoplasm of breast: Secondary | ICD-10-CM

## 2013-04-29 DIAGNOSIS — C3491 Malignant neoplasm of unspecified part of right bronchus or lung: Secondary | ICD-10-CM

## 2013-04-29 NOTE — Progress Notes (Signed)
ID: [name]   DOB: 1935/10/07  MR#: [id number]  ZHY#:[id number]  PCP: Darnelle Bos, MD GYN: SU:  OTHER MD: Alben Deeds  HISTORY OF PRESENT ILLNESS: Patient has a remote history of DCIS, and is status post right mastectomy in 1998. She was then diagnosed with lung adenocarcinoma and underwent a right lower lobectomy in April 2004 for 1.9 cm mass. There was no lymph node involvement. Margins were negative. There is no evidence of recurrence thus far, and the patient is followed with observation alone.  Comorbidities include osteoarthritis, and rheumatoid arthritis.  INTERVAL HISTORY: Karen Kaiser returns today accompanied by her husband Karen Kaiser for followup of her history of breast and lung cancer. The interval history is generally unremarkable. She "feels good", is doing more housework, not yet walking all that much, but working with Dr. Dierdre Forth to bring her rheumatoid arthritis under better control so she can be more active.  REVIEW OF SYSTEMS: Karen Kaiser still does most of the housework. She keeps a little bit of a dry cough, and wonders if that could be related to her methotrexate treatments, but the cough in fact is better.. There has been no fever however no phlegm and no pleurisy. She is having some stress incontinence and then other times its hard to empty the bladder. She bruises easily. She continues to significant back and joint pain as well as problems with pseudogout. She is not having a flare at present however. Her right knee is much better. Overall things "seemed to be improving". A detailed review of systems was otherwise noncontributory   PAST MEDICAL HISTORY: Past Medical History  Diagnosis Date  . PONV (postoperative nausea and vomiting)   . Hypertension   . Heart murmur     as a child   . Peripheral vascular disease     varciose veins in right leg   . Recurrent upper respiratory infection (URI)     sinus infection 1/13 2/13 for tx   . GERD (gastroesophageal  reflux disease)   . Chronic kidney disease     urgency and stress incontinence   . Arthritis     generalized arthrtis   . Cancer     breast mastectomy - 1996 lung ca 2004- RLL  . Glaucoma     PAST SURGICAL HISTORY: Past Surgical History  Procedure Laterality Date  . Mastectomy      right   . Lobectomy      RLL   . Other surgical history      right rotator cuff surgery   . Eye surgery      bilateral cataract srugery   . Breast biopsy      left breast biopsy 2006   . Dilation and curettage of uterus    . Total knee arthroplasty  02/12/2012    Procedure: TOTAL KNEE ARTHROPLASTY;  Surgeon: Loanne Drilling, MD;  Location: [facility];  Service: Orthopedics;  Laterality: Right;    FAMILY HISTORY No family history on file. Cancer. sister (ovarian, breast) and brother (lung and brain)  Cerebrovascular Accident. mother and father  Diabetes Mellitus. mother and sister  Hypertension. mother, sister and child  Osteoarthritis. mother  Father. deceased age 6, stroke  Mother. deceased age 42, stroke. DM, HTN  SOCIAL HISTORY: Patient is married and lives with her spouse, Karen Kaiser. She is currently retired.   ADVANCED DIRECTIVES: Not in place  HEALTH MAINTENANCE: History  Substance Use Topics  . Smoking status: Never Smoker   . Smokeless tobacco: Never Used  .  Alcohol Use: No     Colonoscopy: Due in Oct 2013  PAP:  UTD  Bone density:  March 2013, osteoporosis  Lipid panel:  UTD, Dr. Allie Dimmer  Allergies  Allergen Reactions  . Amlodipine Besy-Benazepril Hcl Swelling    10mg  or higher?? Per patient  . Latex Other (See Comments)    Blister/itching/swelling  . Xalatan (Latanoprost) Itching    Current Outpatient Prescriptions  Medication Sig Dispense Refill  . amLODipine-benazepril (LOTREL) 5-40 MG per capsule Take 1 capsule by mouth every morning.      Marland Kitchen atenolol-chlorthalidone (TENORETIC) 50-25 MG per tablet Take 1 tablet by mouth every morning.      . calcium-vitamin D (OSCAL  WITH D) 500-200 MG-UNIT per tablet Take 1 tablet by mouth daily.      . cholecalciferol (VITAMIN D) 1000 UNITS tablet Take 1,000 Units by mouth daily.      . colchicine 0.6 MG tablet Take 0.6 mg by mouth 2 (two) times daily.       Marland Kitchen docusate sodium (COLACE) 100 MG capsule Take 100 mg by mouth 2 (two) times daily as needed.      . dorzolamide-timolol (COSOPT) 22.3-6.8 MG/ML ophthalmic solution Place 1 drop into both eyes 2 (two) times daily.      Marland Kitchen EPINEPHrine (EPI-PEN) 0.3 mg/0.3 mL DEVI Inject 0.3 mg into the muscle as needed. For allergy to Latex      . glucosamine-chondroitin 500-400 MG tablet Take 1 tablet by mouth 3 (three) times daily.      Marland Kitchen ibuprofen (ADVIL,MOTRIN) 800 MG tablet Take 800 mg by mouth 3 (three) times daily.      . Multiple Vitamin (MULTIVITAMIN) tablet Take 1 tablet by mouth daily.      . pantoprazole (PROTONIX) 40 MG tablet Take 40 mg by mouth every morning.      . traMADol (ULTRAM) 50 MG tablet Take 50 mg by mouth every 6 (six) hours as needed.       No current facility-administered medications for this visit.    OBJECTIVE: Elderly white woman in no acute distress Filed Vitals:   04/29/13 1523  BP: 157/78  Pulse: 65  Temp: 97.6 F (36.4 C)  Resp: 20     Body mass index is 22.75 kg/(m^2).    ECOG FS: 2  Filed Weights   04/29/13 1523  Weight: 124 lb 6.4 oz (56.427 kg)   Sclerae unicteric Oropharynx clear No cervical or supraclavicular adenopathy Lungs no rales or rhonchi, no fine crackles, good excursion bilaterally Heart regular rate and rhythm, no murmur appreciated Abd soft, nontender, no splenomegaly MSK no focal spinal tenderness to moderate percussion Neuro: nonfocal, well oriented, pleasant affect Breasts: The right breast is status post mastectomy. There is no evidence of local recurrence. The right axilla is benign. The left breast is unremarkable.   LAB RESULTS: The patient brought a copy of lab work obtained 03/13/2013 through Dr. Shawnee Knapp  office. These are separately scanned.  Lab Results  Component Value Date   WBC 9.9 05/01/2012   NEUTROABS 4.1 05/01/2012   HGB 12.1 05/01/2012   HCT 35.9 05/01/2012   MCV 91.0 05/01/2012   PLT 440* 05/01/2012      Chemistry      Component Value Date/Time   NA 133* 02/16/2012 0435   K 3.5 02/16/2012 0435   CL 98 02/16/2012 0435   CO2 27 02/16/2012 0435   BUN 12 02/16/2012 0435   CREATININE 0.71 02/16/2012 0435      Component Value  Date/Time   CALCIUM 8.8 02/16/2012 0435   ALKPHOS 68 02/02/2012 1515   AST 17 02/02/2012 1515   ALT 19 02/02/2012 1515   BILITOT 0.4 02/02/2012 1515       STUDIES: Unilateral left mammogram 02/10/2013 was unremarkable  Dg Chest 2 View  04/22/2013   *RADIOLOGY REPORT*  Clinical Data: History of breast cancer and lung cancer.  Assess for recurrence.  CHEST - 2 VIEW  Comparison: 02/02/2012  Findings: Heart size is normal.  The lungs are free of focal consolidations and pleural effusions.  No edema.  No pulmonary nodules. Surgical clips identified in the right infrahilar region. Visualized osseous structures have a normal appearance. Postoperative changes are identified consistent with right mastectomy.  IMPRESSION:  1. No evidence for acute cardiopulmonary abnormality. 2.  Status post right mastectomy.   Original Report Authenticated By: Norva Pavlov, M.D.    ASSESSMENT: 78 y.o. [place] woman status post: 1. Right lower lobectomy April 2004 for a 1.9-cm lung adenocarcinoma, with no lymph node involvement, negative margins, and no evidence of recurrence so far. 2. Status post right mastectomy in 1998 for ductal carcinoma in situ. 3      She had a left ductectomy February 2005 for what          proved to be benign changes.   PLAN: Karen Kaiser is doing quite well as far as her breast and lung cancers are concerned, with no evidence of disease activity. We're going to continue to see her on a once a year basis indefinitely. She is very concerned regarding increasing  the dose of methotrexate although she admits this seems to be helping. I have reassured her that if she were to develop a significant pulmonary infections she would certainly have a fever, as she is not on steroids.   She knows to call for any other problems that may develop before her next visit here. MAGRINAT,GUSTAV C    04/29/2013

## 2013-04-30 ENCOUNTER — Telehealth: Payer: Self-pay | Admitting: Oncology

## 2013-04-30 NOTE — Telephone Encounter (Signed)
, °

## 2013-05-05 DIAGNOSIS — H4010X0 Unspecified open-angle glaucoma, stage unspecified: Secondary | ICD-10-CM | POA: Diagnosis not present

## 2013-05-05 DIAGNOSIS — H409 Unspecified glaucoma: Secondary | ICD-10-CM | POA: Diagnosis not present

## 2013-05-05 DIAGNOSIS — H401390 Pigmentary glaucoma, unspecified eye, stage unspecified: Secondary | ICD-10-CM | POA: Diagnosis not present

## 2013-05-06 DIAGNOSIS — M810 Age-related osteoporosis without current pathological fracture: Secondary | ICD-10-CM | POA: Diagnosis not present

## 2013-05-06 DIAGNOSIS — K219 Gastro-esophageal reflux disease without esophagitis: Secondary | ICD-10-CM | POA: Diagnosis not present

## 2013-05-06 DIAGNOSIS — Z1211 Encounter for screening for malignant neoplasm of colon: Secondary | ICD-10-CM | POA: Diagnosis not present

## 2013-05-13 DIAGNOSIS — M159 Polyosteoarthritis, unspecified: Secondary | ICD-10-CM | POA: Diagnosis not present

## 2013-05-13 DIAGNOSIS — M79609 Pain in unspecified limb: Secondary | ICD-10-CM | POA: Diagnosis not present

## 2013-05-13 DIAGNOSIS — M069 Rheumatoid arthritis, unspecified: Secondary | ICD-10-CM | POA: Diagnosis not present

## 2013-05-14 DIAGNOSIS — H04129 Dry eye syndrome of unspecified lacrimal gland: Secondary | ICD-10-CM | POA: Diagnosis not present

## 2013-06-05 DIAGNOSIS — Z124 Encounter for screening for malignant neoplasm of cervix: Secondary | ICD-10-CM | POA: Diagnosis not present

## 2013-08-12 DIAGNOSIS — I10 Essential (primary) hypertension: Secondary | ICD-10-CM | POA: Diagnosis not present

## 2013-08-13 DIAGNOSIS — M069 Rheumatoid arthritis, unspecified: Secondary | ICD-10-CM | POA: Diagnosis not present

## 2013-08-13 DIAGNOSIS — M545 Low back pain: Secondary | ICD-10-CM | POA: Diagnosis not present

## 2013-08-13 DIAGNOSIS — Z23 Encounter for immunization: Secondary | ICD-10-CM | POA: Diagnosis not present

## 2013-08-13 DIAGNOSIS — M159 Polyosteoarthritis, unspecified: Secondary | ICD-10-CM | POA: Diagnosis not present

## 2013-12-01 DIAGNOSIS — M069 Rheumatoid arthritis, unspecified: Secondary | ICD-10-CM | POA: Diagnosis not present

## 2013-12-01 DIAGNOSIS — M159 Polyosteoarthritis, unspecified: Secondary | ICD-10-CM | POA: Diagnosis not present

## 2013-12-01 DIAGNOSIS — M545 Low back pain, unspecified: Secondary | ICD-10-CM | POA: Diagnosis not present

## 2013-12-11 DIAGNOSIS — I10 Essential (primary) hypertension: Secondary | ICD-10-CM | POA: Diagnosis not present

## 2013-12-17 DIAGNOSIS — H401390 Pigmentary glaucoma, unspecified eye, stage unspecified: Secondary | ICD-10-CM | POA: Diagnosis not present

## 2013-12-17 DIAGNOSIS — H409 Unspecified glaucoma: Secondary | ICD-10-CM | POA: Diagnosis not present

## 2014-01-23 ENCOUNTER — Other Ambulatory Visit: Payer: Self-pay | Admitting: Internal Medicine

## 2014-01-23 ENCOUNTER — Other Ambulatory Visit: Payer: Self-pay | Admitting: Oncology

## 2014-01-23 DIAGNOSIS — Z853 Personal history of malignant neoplasm of breast: Secondary | ICD-10-CM

## 2014-01-23 DIAGNOSIS — Z9011 Acquired absence of right breast and nipple: Secondary | ICD-10-CM

## 2014-01-23 DIAGNOSIS — M810 Age-related osteoporosis without current pathological fracture: Secondary | ICD-10-CM

## 2014-01-23 DIAGNOSIS — Z1231 Encounter for screening mammogram for malignant neoplasm of breast: Secondary | ICD-10-CM

## 2014-02-12 ENCOUNTER — Ambulatory Visit
Admission: RE | Admit: 2014-02-12 | Discharge: 2014-02-12 | Disposition: A | Discharge status: Home / Self Care | Payer: Medicare Other | Source: Ambulatory Visit | Attending: Internal Medicine | Admitting: Internal Medicine

## 2014-02-12 ENCOUNTER — Telehealth: Payer: Self-pay | Admitting: *Deleted

## 2014-02-12 ENCOUNTER — Ambulatory Visit
Admission: RE | Admit: 2014-02-12 | Discharge: 2014-02-12 | Disposition: A | Discharge status: Home / Self Care | Payer: 59 | Source: Ambulatory Visit | Attending: Internal Medicine | Admitting: Internal Medicine

## 2014-02-12 ENCOUNTER — Ambulatory Visit (HOSPITAL_COMMUNITY)
Admission: RE | Admit: 2014-02-12 | Discharge: 2014-02-12 | Disposition: A | Discharge status: Home / Self Care | Payer: Medicare Other | Source: Ambulatory Visit | Attending: Oncology | Admitting: Oncology

## 2014-02-12 DIAGNOSIS — Z85118 Personal history of other malignant neoplasm of bronchus and lung: Secondary | ICD-10-CM | POA: Insufficient documentation

## 2014-02-12 DIAGNOSIS — Z853 Personal history of malignant neoplasm of breast: Secondary | ICD-10-CM

## 2014-02-12 DIAGNOSIS — C3490 Malignant neoplasm of unspecified part of unspecified bronchus or lung: Secondary | ICD-10-CM

## 2014-02-12 DIAGNOSIS — R091 Pleurisy: Secondary | ICD-10-CM | POA: Diagnosis not present

## 2014-02-12 DIAGNOSIS — R05 Cough: Secondary | ICD-10-CM | POA: Insufficient documentation

## 2014-02-12 DIAGNOSIS — M810 Age-related osteoporosis without current pathological fracture: Secondary | ICD-10-CM | POA: Diagnosis not present

## 2014-02-12 DIAGNOSIS — R059 Cough, unspecified: Secondary | ICD-10-CM | POA: Insufficient documentation

## 2014-02-12 DIAGNOSIS — Z1231 Encounter for screening mammogram for malignant neoplasm of breast: Secondary | ICD-10-CM

## 2014-02-12 DIAGNOSIS — Z9011 Acquired absence of right breast and nipple: Secondary | ICD-10-CM

## 2014-02-12 NOTE — Telephone Encounter (Signed)
Laverle called to this RN to state noted ongoing cough since winter treated 2x by antibiotic ( one ordered by rheumatologist and other by dentist ) now with recurrence.  Per discussion Ryhanna states she is scheduled for CXR in June per Dr Jannifer Rodney " but am wondering if it I need one before then,I am trying to tell myself it probably isn't because I have had lung cancer "  Plan per review of concerns and history is for pt to obtain CXR now.  She will call post obtaining so it can be reviewed by MD and call returned to her.

## 2014-02-13 ENCOUNTER — Telehealth: Payer: Self-pay | Admitting: *Deleted

## 2014-02-13 NOTE — Telephone Encounter (Signed)
This RN spoke with pt per her call stating she had CX done yesterday and was calling for the results.  Informed pt of normal/stable reading of CX.  Per discussion regarding cough - pt states she is unable to take " DM" formulas for relief and has been using plain Robitussin with minimal benefit.  " but then I decided to try Claritin for the past 2 days and I have noticed that it has lessened my cough "  At present pt has no additional needs per this office.  If cough worsens or persist she will follow up with her primary MD.

## 2014-03-04 DIAGNOSIS — M545 Low back pain, unspecified: Secondary | ICD-10-CM | POA: Diagnosis not present

## 2014-03-04 DIAGNOSIS — M159 Polyosteoarthritis, unspecified: Secondary | ICD-10-CM | POA: Diagnosis not present

## 2014-03-04 DIAGNOSIS — M069 Rheumatoid arthritis, unspecified: Secondary | ICD-10-CM | POA: Diagnosis not present

## 2014-04-09 DIAGNOSIS — M810 Age-related osteoporosis without current pathological fracture: Secondary | ICD-10-CM | POA: Diagnosis not present

## 2014-04-09 DIAGNOSIS — I10 Essential (primary) hypertension: Secondary | ICD-10-CM | POA: Diagnosis not present

## 2014-04-20 ENCOUNTER — Other Ambulatory Visit: Payer: Self-pay | Admitting: Physician Assistant

## 2014-04-20 DIAGNOSIS — Z853 Personal history of malignant neoplasm of breast: Secondary | ICD-10-CM

## 2014-04-21 ENCOUNTER — Other Ambulatory Visit (HOSPITAL_BASED_OUTPATIENT_CLINIC_OR_DEPARTMENT_OTHER): Payer: Medicare Other

## 2014-04-21 DIAGNOSIS — Z853 Personal history of malignant neoplasm of breast: Secondary | ICD-10-CM

## 2014-04-21 DIAGNOSIS — Z85118 Personal history of other malignant neoplasm of bronchus and lung: Secondary | ICD-10-CM | POA: Diagnosis not present

## 2014-04-21 LAB — COMPREHENSIVE METABOLIC PANEL (CC13)
ALBUMIN: 3.6 g/dL (ref 3.5–5.0)
ALK PHOS: 64 U/L (ref 40–150)
ALT: 13 U/L (ref 0–55)
ANION GAP: 15 meq/L — AB (ref 3–11)
AST: 16 U/L (ref 5–34)
BUN: 14.9 mg/dL (ref 7.0–26.0)
CO2: 20 meq/L — AB (ref 22–29)
Calcium: 9.3 mg/dL (ref 8.4–10.4)
Chloride: 101 mEq/L (ref 98–109)
Creatinine: 0.8 mg/dL (ref 0.6–1.1)
GLUCOSE: 106 mg/dL (ref 70–140)
POTASSIUM: 3.7 meq/L (ref 3.5–5.1)
Sodium: 135 mEq/L — ABNORMAL LOW (ref 136–145)
Total Bilirubin: 0.74 mg/dL (ref 0.20–1.20)
Total Protein: 6.7 g/dL (ref 6.4–8.3)

## 2014-04-21 LAB — CBC WITH DIFFERENTIAL/PLATELET
BASO%: 0.3 % (ref 0.0–2.0)
Basophils Absolute: 0 10*3/uL (ref 0.0–0.1)
EOS ABS: 0.2 10*3/uL (ref 0.0–0.5)
EOS%: 1.6 % (ref 0.0–7.0)
HCT: 38.1 % (ref 34.8–46.6)
HGB: 12.7 g/dL (ref 11.6–15.9)
LYMPH#: 2.5 10*3/uL (ref 0.9–3.3)
LYMPH%: 25.4 % (ref 14.0–49.7)
MCH: 32.6 pg (ref 25.1–34.0)
MCHC: 33.3 g/dL (ref 31.5–36.0)
MCV: 97.9 fL (ref 79.5–101.0)
MONO#: 0.7 10*3/uL (ref 0.1–0.9)
MONO%: 7.2 % (ref 0.0–14.0)
NEUT%: 65.5 % (ref 38.4–76.8)
NEUTROS ABS: 6.5 10*3/uL (ref 1.5–6.5)
Platelets: 315 10*3/uL (ref 145–400)
RBC: 3.89 10*6/uL (ref 3.70–5.45)
RDW: 14.6 % — AB (ref 11.2–14.5)
WBC: 9.9 10*3/uL (ref 3.9–10.3)

## 2014-04-28 ENCOUNTER — Encounter: Payer: Self-pay | Admitting: Physician Assistant

## 2014-04-28 ENCOUNTER — Ambulatory Visit (HOSPITAL_BASED_OUTPATIENT_CLINIC_OR_DEPARTMENT_OTHER): Payer: Medicare Other | Admitting: Physician Assistant

## 2014-04-28 ENCOUNTER — Telehealth: Payer: Self-pay | Admitting: Physician Assistant

## 2014-04-28 VITALS — BP 167/75 | HR 65 | Temp 98.3°F | Resp 18 | Ht 62.0 in | Wt 127.9 lb

## 2014-04-28 DIAGNOSIS — M2550 Pain in unspecified joint: Secondary | ICD-10-CM

## 2014-04-28 DIAGNOSIS — Z1231 Encounter for screening mammogram for malignant neoplasm of breast: Secondary | ICD-10-CM

## 2014-04-28 DIAGNOSIS — Z85118 Personal history of other malignant neoplasm of bronchus and lung: Secondary | ICD-10-CM

## 2014-04-28 DIAGNOSIS — M069 Rheumatoid arthritis, unspecified: Secondary | ICD-10-CM | POA: Insufficient documentation

## 2014-04-28 DIAGNOSIS — Z853 Personal history of malignant neoplasm of breast: Secondary | ICD-10-CM

## 2014-04-28 DIAGNOSIS — C3490 Malignant neoplasm of unspecified part of unspecified bronchus or lung: Secondary | ICD-10-CM

## 2014-04-28 NOTE — Progress Notes (Signed)
ID: Karen Kaiser   DOB: 01/03/1935  MR#: [id number]  AJO#:[id number]  PCP: Horton Finer, MD GYN: SU:  OTHER MD: Leigh Aurora, MD;  Gaynelle Arabian, MD;  Clarene Essex, MD  CHIEF COMPLAINTS:  1) Hx of right lung adenocarcinoma      2)  Hx of right DCIS       HISTORY OF PRESENT ILLNESS: Patient has a remote history of DCIS, and is status post right mastectomy in 1998. She was then diagnosed with lung adenocarcinoma and underwent a right lower lobectomy in April 2004 for 1.9 cm mass. There was no lymph node involvement. Margins were negative. There is no evidence of recurrence thus far, and the patient is followed with observation alone.  Comorbidities include osteoarthritis, and rheumatoid arthritis.  INTERVAL HISTORY: Karen Kaiser returns today accompanied by her husband Karen Kaiser for a routine one-year followup of her history of both lung adenocarcinoma and right breast cancer.   Interval history is generally unremarkable. Her biggest complaints and concerns continue to be the joint pain associated with rheumatoid arthritis. She tells me she has some good days, and some bad days. She had some dental procedures done this year, requiring her to be off the methotrexate for 7 or 8 weeks. She is still "catching up" since starting back on the medication, and is currently having increased joint pain as a result. She continues to be followed closely by Dr.  Amil Amen.  She did have a chest x-ray in March after complaints of increased cough. A chest x-ray was unremarkable as detailed below. She started Claritin soon thereafter, in the cough has completely resolved.   REVIEW OF SYSTEMS: Karen Kaiser denies any recent illnesses and has had no fevers, chills, or night sweats. Her energy level is fair. She's had no rashes or skin changes and denies any abnormal bleeding. Her appetite is very good, and she denies any nausea or emesis. She occasionally has mild constipation. She is due for her next screening  colonoscopy later this year under the care of Dr. Watt Climes. She has mild urinary incontinence which is stable. She denies any dysuria or hematuria. She currently denies any cough and has had no phlegm production, pleurisy, hemoptysis, increased shortness of breath, peripheral swelling, chest pain, or palpitations. She denies any abnormal headaches or change in vision. She had an episode of dizziness this past weekend which she attributes to dehydration. This was an isolated incident. She checked her blood pressure and it was in fact a little low. She drank a "couple glasses of water", began to feel better, and her blood pressure quickly returned to normal. She is following this closely at home, and is trying to keep herself well hydrated at this point.  A detailed review of systems is otherwise stable and noncontributory.    PAST MEDICAL HISTORY: Past Medical History  Diagnosis Date  . PONV (postoperative nausea and vomiting)   . Hypertension   . Heart murmur     as a child   . Peripheral vascular disease     varciose veins in right leg   . Recurrent upper respiratory infection (URI)     sinus infection 1/13 2/13 for tx   . GERD (gastroesophageal reflux disease)   . Chronic kidney disease     urgency and stress incontinence   . Arthritis     generalized arthrtis   . Cancer     breast mastectomy - 1996 lung ca 2004- RLL  . Glaucoma     PAST SURGICAL HISTORY: Past  Surgical History  Procedure Laterality Date  . Mastectomy      right   . Lobectomy      RLL   . Other surgical history      right rotator cuff surgery   . Eye surgery      bilateral cataract srugery   . Breast biopsy      left breast biopsy 2006   . Dilation and curettage of uterus    . Total knee arthroplasty  02/12/2012    Procedure: TOTAL KNEE ARTHROPLASTY;  Surgeon: Gearlean Alf, MD;  Location: [facility];  Service: Orthopedics;  Laterality: Right;    FAMILY HISTORY History reviewed. No pertinent family  history. Cancer. sister (ovarian, breast) and brother (lung and brain)  Cerebrovascular Accident. mother and father  Diabetes Mellitus. mother and sister  Hypertension. mother, sister and child  Osteoarthritis. mother  Father. deceased age 31, stroke  Mother. deceased age 52, stroke. DM, HTN  SOCIAL HISTORY:  (Reviewed 04/28/2014)  Patient is married and lives with her spouse, Karen Kaiser. She is currently retired.   ADVANCED DIRECTIVES: Not in place  HEALTH MAINTENANCE:  (updated 04/28/2014) History  Substance Use Topics  . Smoking status: Never Smoker   . Smokeless tobacco: Never Used  . Alcohol Use: No     Colonoscopy: Due in Oct 2015 [facility])  PAP:  Not on file  Bone density:  March 2015, osteoporosis with a T score -3.1 (followed by Dr. Maxwell Caul)  Lipid panel:  UTD, Dr. Maxwell Caul  Allergies  Allergen Reactions  . Amlodipine Besy-Benazepril Hcl Swelling    10mg  or higher?? Per patient  . Latex Other (See Comments)    Blister/itching/swelling  . Xalatan [Latanoprost] Itching    Current Outpatient Prescriptions  Medication Sig Dispense Refill  . acetaminophen (TYLENOL) 500 MG tablet Take 500 mg by mouth every 6 (six) hours as needed for moderate pain. Pt takes 2 tablets twice a day      . amLODipine-benazepril (LOTREL) 5-20 MG per capsule Take 1 capsule by mouth every morning.      Marland Kitchen atenolol-chlorthalidone (TENORETIC) 50-25 MG per tablet Take 0.5 tablets by mouth every morning.       . calcium-vitamin D (OSCAL WITH D) 500-200 MG-UNIT per tablet Take 1 tablet by mouth 3 (three) times daily.       . cholecalciferol (VITAMIN D) 1000 UNITS tablet Take 1,000 Units by mouth daily. Pt takes Vit D 5 x weekly.      . colchicine 0.6 MG tablet Take 0.6 mg by mouth daily. Pt takes 1 tab every other day.      . dorzolamide-timolol (COSOPT) 22.3-6.8 MG/ML ophthalmic solution Place 1 drop into both eyes 2 (two) times daily.      . folic acid (FOLVITE) 1 MG tablet 1 mg daily.       .  methotrexate (RHEUMATREX) 2.5 MG tablet once a week. Pt takes 8 tablets once a week.      . Multiple Vitamin (MULTIVITAMIN) tablet Take 1 tablet by mouth daily.      . ranitidine (ZANTAC) 150 MG tablet Take 150 mg by mouth every morning.       Marland Kitchen EPINEPHrine (EPI-PEN) 0.3 mg/0.3 mL DEVI Inject 0.3 mg into the muscle as needed. For allergy to Latex       No current facility-administered medications for this visit.    OBJECTIVE: Elderly white woman  who appears comfortable and is in no acute distress Filed Vitals:   04/28/14 1331  BP:  167/75  Pulse: 65  Temp: 98.3 F (36.8 C)  Resp: 18     Body mass index is 23.39 kg/(m^2).    ECOG FS: 1 Filed Weights   04/28/14 1331  Weight: 127 lb 14.4 oz (58.015 kg)   Physical Exam: HEENT:  Sclerae anicteric.  Oropharynx clear, pink, and moist. No ulcerations or mucositis. No candidiasis. Neck supple, trachea midline.  NODES:  No cervical or supraclavicular lymphadenopathy palpated.  BREAST EXAM:  Patient is status post right mastectomy. No suspicious nodularities or skin changes, and no evidence of local recurrence in the right chest wall. Left breast is status post incisional biopsy, with well-healed scar and no suspicious nodularities. There is nipple inversion which is chronic and stable. No skin changes are noted, and left breast is otherwise unremarkable. Axillae are benign bilaterally, with no palpable lymphadenopathy. LUNGS:  Clear to auscultation bilaterally with good excursion.  No wheezes or rhonchi HEART:  Regular rate and rhythm. No murmur  ABDOMEN:  Soft, nontender. No organomegaly or masses palpated. Positive bowel sounds.  MSK:  No focal spinal tenderness to palpation. Some limited range of motion secondary to joint pain.  No significant joint swelling or erythema.  EXTREMITIES:  No peripheral edema.  No clubbing or cyanosis. SKIN:  No visible rashes. No excessive ecchymoses. No petechiae. No pallor. Skin is warm and dry. NEURO:   Nonfocal. Well oriented.  Appropriate affect.     LAB RESULTS:   Lab Results  Component Value Date   WBC 9.9 04/21/2014   NEUTROABS 6.5 04/21/2014   HGB 12.7 04/21/2014   HCT 38.1 04/21/2014   MCV 97.9 04/21/2014   PLT 315 04/21/2014      Chemistry      Component Value Date/Time   NA 135* 04/21/2014 1109   NA 133* 02/16/2012 0435   K 3.7 04/21/2014 1109   K 3.5 02/16/2012 0435   CL 98 02/16/2012 0435   CO2 20* 04/21/2014 1109   CO2 27 02/16/2012 0435   BUN 14.9 04/21/2014 1109   BUN 12 02/16/2012 0435   CREATININE 0.8 04/21/2014 1109   CREATININE 0.71 02/16/2012 0435      Component Value Date/Time   CALCIUM 9.3 04/21/2014 1109   CALCIUM 8.8 02/16/2012 0435   ALKPHOS 64 04/21/2014 1109   ALKPHOS 68 02/02/2012 1515   AST 16 04/21/2014 1109   AST 17 02/02/2012 1515   ALT 13 04/21/2014 1109   ALT 19 02/02/2012 1515   BILITOT 0.74 04/21/2014 1109   BILITOT 0.4 02/02/2012 1515       STUDIES: Unilateral left mammogram 02/12/2014 was unremarkable    DG Chest 2 View 02/12/2014    CLINICAL DATA: Cough since December. History of lung and breast  carcinoma.  EXAM:  CHEST 2 VIEW  COMPARISON: 04/22/2013  FINDINGS:  Cardiac silhouette is normal in size. Normal knees an hilar  contours. There are surgical vascular clips inferior to the right  hilum. Pleural thickening is noted at the right lung base. These  findings are stable.  Hyperexpanded but clear Lung. No pleural effusion or pneumothorax.  Bony thorax is demineralized but grossly intact.  IMPRESSION:  No acute cardiopulmonary disease.  Electronically Signed  By: Lajean Manes M.D.  On: 02/12/2014 15:05     ASSESSMENT: 79 y.o. [place] woman status post: 1. Right lower lobectomy April 2004 for a 1.9-cm lung adenocarcinoma, with no lymph node involvement, negative margins, and no evidence of recurrence so far. 2. Status post right mastectomy in 1998 for  ductal carcinoma in situ. 3      She had a left ductectomy February 2005 for what           proved to be benign changes.   PLAN: Karen Kaiser  appears to be doing quite well with regards to both her lung and breast cancer histories. Clinically, there is no evidence of disease recurrence. I plan is to continue seeing her on an annual basis indefinitely, with an annual left mammogram and an annual chest x-ray. All of this has been scheduled for Karen Kaiser, and she will return to see Dr. Jana Hakim in one year, June 2016.   In the meanwhile,  I have encouraged her to continue following her blood pressure closely, and keep herself well hydrated, and contact her primary care physician right away if she has any additional episodes of dizziness. She and Karen Kaiser both voice their understanding and agreement.   Karen Kaiser Milda Smart  PA-C   04/28/2014

## 2014-04-28 NOTE — Telephone Encounter (Signed)
per pof tosch pt appts-mamma-CXR is ordered 04/2015-pt will have CXR after labs on 04/27/15-printed &  gave pt sch

## 2014-05-07 DIAGNOSIS — K219 Gastro-esophageal reflux disease without esophagitis: Secondary | ICD-10-CM | POA: Diagnosis not present

## 2014-05-07 DIAGNOSIS — Z800 Family history of malignant neoplasm of digestive organs: Secondary | ICD-10-CM | POA: Diagnosis not present

## 2014-06-03 DIAGNOSIS — M069 Rheumatoid arthritis, unspecified: Secondary | ICD-10-CM | POA: Diagnosis not present

## 2014-06-03 DIAGNOSIS — M545 Low back pain, unspecified: Secondary | ICD-10-CM | POA: Diagnosis not present

## 2014-06-03 DIAGNOSIS — M159 Polyosteoarthritis, unspecified: Secondary | ICD-10-CM | POA: Diagnosis not present

## 2014-06-10 DIAGNOSIS — H401390 Pigmentary glaucoma, unspecified eye, stage unspecified: Secondary | ICD-10-CM | POA: Diagnosis not present

## 2014-06-10 DIAGNOSIS — H409 Unspecified glaucoma: Secondary | ICD-10-CM | POA: Diagnosis not present

## 2014-06-15 DIAGNOSIS — Z124 Encounter for screening for malignant neoplasm of cervix: Secondary | ICD-10-CM | POA: Diagnosis not present

## 2014-07-01 DIAGNOSIS — Z800 Family history of malignant neoplasm of digestive organs: Secondary | ICD-10-CM | POA: Diagnosis not present

## 2014-07-01 DIAGNOSIS — Z1211 Encounter for screening for malignant neoplasm of colon: Secondary | ICD-10-CM | POA: Diagnosis not present

## 2014-07-01 DIAGNOSIS — K5730 Diverticulosis of large intestine without perforation or abscess without bleeding: Secondary | ICD-10-CM | POA: Diagnosis not present

## 2014-07-09 DIAGNOSIS — H04129 Dry eye syndrome of unspecified lacrimal gland: Secondary | ICD-10-CM | POA: Diagnosis not present

## 2014-07-30 DIAGNOSIS — I10 Essential (primary) hypertension: Secondary | ICD-10-CM | POA: Diagnosis not present

## 2014-07-30 DIAGNOSIS — M1990 Unspecified osteoarthritis, unspecified site: Secondary | ICD-10-CM | POA: Diagnosis not present

## 2014-07-30 DIAGNOSIS — Z23 Encounter for immunization: Secondary | ICD-10-CM | POA: Diagnosis not present

## 2014-08-06 DIAGNOSIS — M159 Polyosteoarthritis, unspecified: Secondary | ICD-10-CM | POA: Diagnosis not present

## 2014-08-06 DIAGNOSIS — M545 Low back pain, unspecified: Secondary | ICD-10-CM | POA: Diagnosis not present

## 2014-08-06 DIAGNOSIS — M069 Rheumatoid arthritis, unspecified: Secondary | ICD-10-CM | POA: Diagnosis not present

## 2014-08-11 DIAGNOSIS — L739 Follicular disorder, unspecified: Secondary | ICD-10-CM | POA: Diagnosis not present

## 2014-08-11 DIAGNOSIS — L259 Unspecified contact dermatitis, unspecified cause: Secondary | ICD-10-CM | POA: Diagnosis not present

## 2014-08-11 DIAGNOSIS — D485 Neoplasm of uncertain behavior of skin: Secondary | ICD-10-CM | POA: Diagnosis not present

## 2014-09-01 DIAGNOSIS — L219 Seborrheic dermatitis, unspecified: Secondary | ICD-10-CM | POA: Diagnosis not present

## 2014-09-01 DIAGNOSIS — L309 Dermatitis, unspecified: Secondary | ICD-10-CM | POA: Diagnosis not present

## 2014-10-13 DIAGNOSIS — L309 Dermatitis, unspecified: Secondary | ICD-10-CM | POA: Diagnosis not present

## 2014-11-05 DIAGNOSIS — M1180 Other specified crystal arthropathies, unspecified site: Secondary | ICD-10-CM | POA: Diagnosis not present

## 2014-11-05 DIAGNOSIS — M150 Primary generalized (osteo)arthritis: Secondary | ICD-10-CM | POA: Diagnosis not present

## 2014-11-05 DIAGNOSIS — M545 Low back pain: Secondary | ICD-10-CM | POA: Diagnosis not present

## 2014-11-05 DIAGNOSIS — M0589 Other rheumatoid arthritis with rheumatoid factor of multiple sites: Secondary | ICD-10-CM | POA: Diagnosis not present

## 2014-11-24 DIAGNOSIS — H401331 Pigmentary glaucoma, bilateral, mild stage: Secondary | ICD-10-CM | POA: Diagnosis not present

## 2014-11-24 DIAGNOSIS — H4011X1 Primary open-angle glaucoma, mild stage: Secondary | ICD-10-CM | POA: Diagnosis not present

## 2014-11-24 DIAGNOSIS — Z961 Presence of intraocular lens: Secondary | ICD-10-CM | POA: Diagnosis not present

## 2014-12-03 DIAGNOSIS — Z23 Encounter for immunization: Secondary | ICD-10-CM | POA: Diagnosis not present

## 2014-12-03 DIAGNOSIS — I10 Essential (primary) hypertension: Secondary | ICD-10-CM | POA: Diagnosis not present

## 2015-01-08 DIAGNOSIS — J329 Chronic sinusitis, unspecified: Secondary | ICD-10-CM | POA: Diagnosis not present

## 2015-02-04 DIAGNOSIS — M0589 Other rheumatoid arthritis with rheumatoid factor of multiple sites: Secondary | ICD-10-CM | POA: Diagnosis not present

## 2015-02-04 DIAGNOSIS — M150 Primary generalized (osteo)arthritis: Secondary | ICD-10-CM | POA: Diagnosis not present

## 2015-02-04 DIAGNOSIS — M545 Low back pain: Secondary | ICD-10-CM | POA: Diagnosis not present

## 2015-02-04 DIAGNOSIS — M1180 Other specified crystal arthropathies, unspecified site: Secondary | ICD-10-CM | POA: Diagnosis not present

## 2015-02-11 DIAGNOSIS — Z471 Aftercare following joint replacement surgery: Secondary | ICD-10-CM | POA: Diagnosis not present

## 2015-02-11 DIAGNOSIS — Z96651 Presence of right artificial knee joint: Secondary | ICD-10-CM | POA: Diagnosis not present

## 2015-02-15 ENCOUNTER — Ambulatory Visit
Admission: RE | Admit: 2015-02-15 | Discharge: 2015-02-15 | Disposition: A | Discharge status: Home / Self Care | Payer: Medicare Other | Source: Ambulatory Visit | Attending: Oncology | Admitting: Oncology

## 2015-02-15 DIAGNOSIS — Z1231 Encounter for screening mammogram for malignant neoplasm of breast: Secondary | ICD-10-CM

## 2015-04-06 DIAGNOSIS — M1180 Other specified crystal arthropathies, unspecified site: Secondary | ICD-10-CM | POA: Diagnosis not present

## 2015-04-06 DIAGNOSIS — M545 Low back pain: Secondary | ICD-10-CM | POA: Diagnosis not present

## 2015-04-06 DIAGNOSIS — M0589 Other rheumatoid arthritis with rheumatoid factor of multiple sites: Secondary | ICD-10-CM | POA: Diagnosis not present

## 2015-04-06 DIAGNOSIS — M150 Primary generalized (osteo)arthritis: Secondary | ICD-10-CM | POA: Diagnosis not present

## 2015-04-08 DIAGNOSIS — I10 Essential (primary) hypertension: Secondary | ICD-10-CM | POA: Diagnosis not present

## 2015-04-27 ENCOUNTER — Ambulatory Visit (HOSPITAL_COMMUNITY): Admission: RE | Admit: 2015-04-27 | Payer: Medicare Other | Source: Ambulatory Visit

## 2015-04-27 ENCOUNTER — Ambulatory Visit (HOSPITAL_COMMUNITY)
Admission: RE | Admit: 2015-04-27 | Discharge: 2015-04-27 | Disposition: A | Discharge status: Home / Self Care | Payer: Medicare Other | Source: Ambulatory Visit | Attending: Oncology | Admitting: Oncology

## 2015-04-27 ENCOUNTER — Other Ambulatory Visit (HOSPITAL_BASED_OUTPATIENT_CLINIC_OR_DEPARTMENT_OTHER): Payer: Medicare Other

## 2015-04-27 DIAGNOSIS — Z853 Personal history of malignant neoplasm of breast: Secondary | ICD-10-CM | POA: Diagnosis not present

## 2015-04-27 DIAGNOSIS — R918 Other nonspecific abnormal finding of lung field: Secondary | ICD-10-CM | POA: Diagnosis not present

## 2015-04-27 DIAGNOSIS — Z85118 Personal history of other malignant neoplasm of bronchus and lung: Secondary | ICD-10-CM | POA: Diagnosis present

## 2015-04-27 DIAGNOSIS — M069 Rheumatoid arthritis, unspecified: Secondary | ICD-10-CM | POA: Diagnosis not present

## 2015-04-27 DIAGNOSIS — C3490 Malignant neoplasm of unspecified part of unspecified bronchus or lung: Secondary | ICD-10-CM

## 2015-04-27 LAB — CBC WITH DIFFERENTIAL/PLATELET
BASO%: 0.3 % (ref 0.0–2.0)
Basophils Absolute: 0 10*3/uL (ref 0.0–0.1)
EOS%: 1 % (ref 0.0–7.0)
Eosinophils Absolute: 0.1 10*3/uL (ref 0.0–0.5)
HCT: 41.9 % (ref 34.8–46.6)
HGB: 14.3 g/dL (ref 11.6–15.9)
LYMPH%: 22.1 % (ref 14.0–49.7)
MCH: 33.7 pg (ref 25.1–34.0)
MCHC: 34.1 g/dL (ref 31.5–36.0)
MCV: 98.8 fL (ref 79.5–101.0)
MONO#: 0.7 10*3/uL (ref 0.1–0.9)
MONO%: 6.2 % (ref 0.0–14.0)
NEUT%: 70.4 % (ref 38.4–76.8)
NEUTROS ABS: 7.5 10*3/uL — AB (ref 1.5–6.5)
Platelets: 295 10*3/uL (ref 145–400)
RBC: 4.24 10*6/uL (ref 3.70–5.45)
RDW: 14.4 % (ref 11.2–14.5)
WBC: 10.6 10*3/uL — AB (ref 3.9–10.3)
lymph#: 2.4 10*3/uL (ref 0.9–3.3)

## 2015-04-27 LAB — COMPREHENSIVE METABOLIC PANEL (CC13)
ALT: 16 U/L (ref 0–55)
AST: 20 U/L (ref 5–34)
Albumin: 3.9 g/dL (ref 3.5–5.0)
Alkaline Phosphatase: 65 U/L (ref 40–150)
Anion Gap: 10 mEq/L (ref 3–11)
BUN: 16.7 mg/dL (ref 7.0–26.0)
CALCIUM: 9.6 mg/dL (ref 8.4–10.4)
CHLORIDE: 100 meq/L (ref 98–109)
CO2: 28 mEq/L (ref 22–29)
Creatinine: 0.7 mg/dL (ref 0.6–1.1)
EGFR: 80 mL/min/{1.73_m2} — ABNORMAL LOW (ref >90)
GLUCOSE: 102 mg/dL (ref 70–140)
POTASSIUM: 4.2 meq/L (ref 3.5–5.1)
Sodium: 137 mEq/L (ref 136–145)
Total Bilirubin: 0.97 mg/dL (ref 0.20–1.20)
Total Protein: 6.9 g/dL (ref 6.4–8.3)

## 2015-05-04 ENCOUNTER — Other Ambulatory Visit: Payer: Medicare Other

## 2015-05-04 ENCOUNTER — Telehealth: Payer: Self-pay | Admitting: Oncology

## 2015-05-04 ENCOUNTER — Ambulatory Visit (HOSPITAL_BASED_OUTPATIENT_CLINIC_OR_DEPARTMENT_OTHER): Payer: Medicare Other | Admitting: Oncology

## 2015-05-04 VITALS — BP 161/78 | HR 63 | Temp 97.6°F | Resp 18 | Ht 62.0 in | Wt 127.8 lb

## 2015-05-04 DIAGNOSIS — Z86000 Personal history of in-situ neoplasm of breast: Secondary | ICD-10-CM

## 2015-05-04 DIAGNOSIS — Z85118 Personal history of other malignant neoplasm of bronchus and lung: Secondary | ICD-10-CM | POA: Diagnosis not present

## 2015-05-04 DIAGNOSIS — C3490 Malignant neoplasm of unspecified part of unspecified bronchus or lung: Secondary | ICD-10-CM

## 2015-05-04 NOTE — Addendum Note (Signed)
Addended by: Chauncey Cruel on: 05/04/2015 02:21 PM   Modules accepted: Orders, SmartSet

## 2015-05-04 NOTE — Telephone Encounter (Signed)
Gave avs & calendar for June 2017 °

## 2015-05-04 NOTE — Progress Notes (Signed)
ADDENDUM:   The tick bite area in the mid back it is slightly raised and slightly erythematous, but not inflamed. There is no rash associated with this.

## 2015-05-04 NOTE — Progress Notes (Signed)
ID: Karen Kaiser   DOB: 06/15/1935  MR#: [id number]  ASN#:[id number]  PCP: Horton Finer, MD GYN: SU:  OTHER MD: Leigh Aurora, MD;  Gaynelle Arabian, MD;  Clarene Essex, MD  CHIEF COMPLAINTS:  1) Hx of right lung adenocarcinoma      2)  Hx of right DCIS      CURRENT TREATMENT: Observation  HISTORY OF PRESENT ILLNESS:  from the prior summary:  Patient has a remote history of DCIS, and is status post right mastectomy in 1998. She was then diagnosed with lung adenocarcinoma and underwent a right lower lobectomy in April 2004 for 1.9 cm mass. There was no lymph node involvement. Margins were negative. There is no evidence of recurrence thus far, and the patient is followed with observation alone.  Comorbidities include osteoarthritis, and rheumatoid arthritis.  INTERVAL HISTORY: Karen Kaiser returns today accompanied by her husband Karen Kaiser for Follow-up of her remote history of lung cancer.  Interval history is significant for her sister and brother-in-law both dying a few months ago, within 3 months of each other. There were both 78 years old. Because Karen Kaiser had 2 the very involved with their terminal care and the aftermath as well, Karen Kaiser and she were very motivated to get their affairs in order and they are in the process of getting that completed. He did bring advanced directives today which I have copied and scanned separately.  REVIEW OF SYSTEMS: Karen Kaiser is doing well , with no cough, phlegm production, pleurisy, or hemoptysis. She had a tick bite on her back that she wanted me to look at. Just some stress urinary incontinence problems. She bruises easily. She has arthritis pains here in there which are not more intense or frequent than before a detailed review of systems today was otherwise stable   PAST MEDICAL HISTORY: Past Medical History  Diagnosis Date  . PONV (postoperative nausea and vomiting)   . Hypertension   . Heart murmur     as a child   . Peripheral vascular  disease     varciose veins in right leg   . Recurrent upper respiratory infection (URI)     sinus infection 1/13 2/13 for tx   . GERD (gastroesophageal reflux disease)   . Chronic kidney disease     urgency and stress incontinence   . Arthritis     generalized arthrtis   . Cancer     breast mastectomy - 1996 lung ca 2004- RLL  . Glaucoma     PAST SURGICAL HISTORY: Past Surgical History  Procedure Laterality Date  . Mastectomy      right   . Lobectomy      RLL   . Other surgical history      right rotator cuff surgery   . Eye surgery      bilateral cataract srugery   . Breast biopsy      left breast biopsy 2006   . Dilation and curettage of uterus    . Total knee arthroplasty  02/12/2012    Procedure: TOTAL KNEE ARTHROPLASTY;  Surgeon: Gearlean Alf, MD;  Location: [facility];  Service: Orthopedics;  Laterality: Right;    FAMILY HISTORY No family history on file. Cancer. sister (ovarian, breast) and brother (lung and brain)  Cerebrovascular Accident. mother and father  Diabetes Mellitus. mother and sister  Hypertension. mother, sister and child  Osteoarthritis. mother  Father. deceased age 55, stroke  Mother. deceased age 38, stroke. DM, HTN  SOCIAL HISTORY:  (Reviewed  04/28/2014)  She is currently retired. Patient is married and lives with her spouse, Karen Kaiser.  Both have children from prior marriages   ADVANCED DIRECTIVES:  In place  HEALTH MAINTENANCE:  (updated 04/28/2014) History  Substance Use Topics  . Smoking status: Never Smoker   . Smokeless tobacco: Never Used  . Alcohol Use: No     Colonoscopy: Due in Oct 2015 [facility])  PAP:  Not on file  Bone density:  March 2015, osteoporosis with a T score -3.1 (followed by Dr. Maxwell Caul)  Lipid panel:  UTD, Dr. Maxwell Caul  Allergies  Allergen Reactions  . Amlodipine Besy-Benazepril Hcl Swelling    '10mg'$  or higher?? Per patient  . Latex Other (See Comments)    Blister/itching/swelling  . Xalatan [Latanoprost] Itching     Current Outpatient Prescriptions  Medication Sig Dispense Refill  . acetaminophen (TYLENOL) 500 MG tablet Take 500 mg by mouth every 6 (six) hours as needed for moderate pain. Pt takes 2 tablets twice a day    . atenolol-chlorthalidone (TENORETIC) 50-25 MG per tablet Take 0.5 tablets by mouth every morning.     . calcium-vitamin D (OSCAL WITH D) 500-200 MG-UNIT per tablet Take 1 tablet by mouth 3 (three) times daily.     . cholecalciferol (VITAMIN D) 1000 UNITS tablet Take 1,000 Units by mouth daily. Pt takes Vit D 5 x weekly.    . colchicine 0.6 MG tablet Take 0.6 mg by mouth daily. Pt takes 1 tab every other day.    . dorzolamide-timolol (COSOPT) 22.3-6.8 MG/ML ophthalmic solution Place 1 drop into both eyes 2 (two) times daily.    . folic acid (FOLVITE) 1 MG tablet 1 mg daily.     . methotrexate (RHEUMATREX) 2.5 MG tablet once a week. Pt takes 8 tablets once a week.    . Multiple Vitamin (MULTIVITAMIN) tablet Take 1 tablet by mouth daily.    . ranitidine (ZANTAC) 150 MG tablet Take 150 mg by mouth every morning.      No current facility-administered medications for this visit.    OBJECTIVE: Elderly white woman  who appears  Filed Vitals:   05/04/15 1354  BP: 161/78  Pulse: 63  Temp: 97.6 F (36.4 C)  Resp: 18     Body mass index is 23.37 kg/(m^2).    ECOG FS: 1 Filed Weights   05/04/15 1354  Weight: 127 lb 12.8 oz (57.97 kg)   Sclerae unicteric, pupils round and equal Oropharynx clear and moist-- no thrush or other lesions No cervical or supraclavicular adenopathy Lungs no rales or rhonchi Heart regular rate and rhythm Abd soft, nontender, positive bowel sounds MSK no focal spinal tenderness, no upper extremity lymphedema Neuro: nonfocal, well oriented, appropriate affect Breasts:  The right breast is status post mastectomy. There is no evidence of chest wall recurrence. The right axilla is benign. The left breast is unremarkable.   LAB RESULTS:   Lab Results   Component Value Date   WBC 10.6* 04/27/2015   NEUTROABS 7.5* 04/27/2015   HGB 14.3 04/27/2015   HCT 41.9 04/27/2015   MCV 98.8 04/27/2015   PLT 295 04/27/2015      Chemistry      Component Value Date/Time   NA 137 04/27/2015 1050   NA 133* 02/16/2012 0435   K 4.2 04/27/2015 1050   K 3.5 02/16/2012 0435   CL 98 02/16/2012 0435   CO2 28 04/27/2015 1050   CO2 27 02/16/2012 0435   BUN 16.7 04/27/2015 1050  BUN 12 02/16/2012 0435   CREATININE 0.7 04/27/2015 1050   CREATININE 0.71 02/16/2012 0435      Component Value Date/Time   CALCIUM 9.6 04/27/2015 1050   CALCIUM 8.8 02/16/2012 0435   ALKPHOS 65 04/27/2015 1050   ALKPHOS 68 02/02/2012 1515   AST 20 04/27/2015 1050   AST 17 02/02/2012 1515   ALT 16 04/27/2015 1050   ALT 19 02/02/2012 1515   BILITOT 0.97 04/27/2015 1050   BILITOT 0.4 02/02/2012 1515       STUDIES: Dg Chest 2 View  04/27/2015   CLINICAL DATA:  History of breast cancer and lung adenocarcinoma. Evaluate for recurrence.  EXAM: CHEST  2 VIEW  COMPARISON:  02/12/2014  FINDINGS: Stable lucency in the right lower chest. No focal airspace disease or obvious nodularity. Heart and mediastinum are stable. The trachea is midline. Stable hyperinflation. No large pleural effusions. Atherosclerotic calcifications in the aorta. No acute bone abnormality.  IMPRESSION: No acute cardiopulmonary disease.   Electronically Signed   By: Markus Daft M.D.   On: 04/27/2015 15:29     ASSESSMENT: 80 y.o. [place] woman status post: 1. Right lower lobectomy April 2004 for a 1.9-cm lung adenocarcinoma, with no lymph node involvement, negative margins, and no evidence of recurrence so far. 2. Status post right mastectomy in 1998 for ductal carcinoma in situ. 3      She had a left ductectomy February 2005 for what proved to be benign changes.   PLAN: Karen Kaiser is doing fine as far as her history of lung cancer is concerned. She feels coming here once a year is a Development worker, international aid" for  her, and we are glad to provide that simple service.   she and Karen Kaiser have completed advanced directives which time scanning separately. I strongly affirmed they're doing this. Since a each of children from a prior marriage, it will be very important for them to get all the legal will issues settled as much as possible so that there are no misunderstandings or unnecessary stress if and when they pass    she will see me again in one year. We will again do lab work and a chest x-ray prior to that visit. She knows to call for any problems that may develop before then.  Chauncey Cruel, MD    05/04/2015

## 2015-06-10 DIAGNOSIS — Z961 Presence of intraocular lens: Secondary | ICD-10-CM | POA: Diagnosis not present

## 2015-06-10 DIAGNOSIS — H4011X1 Primary open-angle glaucoma, mild stage: Secondary | ICD-10-CM | POA: Diagnosis not present

## 2015-06-10 DIAGNOSIS — H26492 Other secondary cataract, left eye: Secondary | ICD-10-CM | POA: Diagnosis not present

## 2015-06-22 DIAGNOSIS — Z01419 Encounter for gynecological examination (general) (routine) without abnormal findings: Secondary | ICD-10-CM | POA: Diagnosis not present

## 2015-06-22 DIAGNOSIS — Z6824 Body mass index (BMI) 24.0-24.9, adult: Secondary | ICD-10-CM | POA: Diagnosis not present

## 2015-07-12 DIAGNOSIS — M1120 Other chondrocalcinosis, unspecified site: Secondary | ICD-10-CM | POA: Diagnosis not present

## 2015-07-12 DIAGNOSIS — M0589 Other rheumatoid arthritis with rheumatoid factor of multiple sites: Secondary | ICD-10-CM | POA: Diagnosis not present

## 2015-07-12 DIAGNOSIS — M5136 Other intervertebral disc degeneration, lumbar region: Secondary | ICD-10-CM | POA: Diagnosis not present

## 2015-07-12 DIAGNOSIS — M150 Primary generalized (osteo)arthritis: Secondary | ICD-10-CM | POA: Diagnosis not present

## 2015-08-29 DIAGNOSIS — R0789 Other chest pain: Secondary | ICD-10-CM | POA: Diagnosis not present

## 2015-08-30 DIAGNOSIS — M1120 Other chondrocalcinosis, unspecified site: Secondary | ICD-10-CM | POA: Diagnosis not present

## 2015-08-30 DIAGNOSIS — M5136 Other intervertebral disc degeneration, lumbar region: Secondary | ICD-10-CM | POA: Diagnosis not present

## 2015-08-30 DIAGNOSIS — M150 Primary generalized (osteo)arthritis: Secondary | ICD-10-CM | POA: Diagnosis not present

## 2015-08-30 DIAGNOSIS — M0589 Other rheumatoid arthritis with rheumatoid factor of multiple sites: Secondary | ICD-10-CM | POA: Diagnosis not present

## 2015-08-30 DIAGNOSIS — M546 Pain in thoracic spine: Secondary | ICD-10-CM | POA: Diagnosis not present

## 2015-09-24 DIAGNOSIS — Z23 Encounter for immunization: Secondary | ICD-10-CM | POA: Diagnosis not present

## 2015-10-05 DIAGNOSIS — I10 Essential (primary) hypertension: Secondary | ICD-10-CM | POA: Diagnosis not present

## 2015-10-05 DIAGNOSIS — Z1389 Encounter for screening for other disorder: Secondary | ICD-10-CM | POA: Diagnosis not present

## 2015-10-27 DIAGNOSIS — M5136 Other intervertebral disc degeneration, lumbar region: Secondary | ICD-10-CM | POA: Diagnosis not present

## 2015-10-27 DIAGNOSIS — M1120 Other chondrocalcinosis, unspecified site: Secondary | ICD-10-CM | POA: Diagnosis not present

## 2015-10-27 DIAGNOSIS — M150 Primary generalized (osteo)arthritis: Secondary | ICD-10-CM | POA: Diagnosis not present

## 2015-10-27 DIAGNOSIS — M0589 Other rheumatoid arthritis with rheumatoid factor of multiple sites: Secondary | ICD-10-CM | POA: Diagnosis not present

## 2015-12-14 DIAGNOSIS — H401131 Primary open-angle glaucoma, bilateral, mild stage: Secondary | ICD-10-CM | POA: Diagnosis not present

## 2016-01-05 DIAGNOSIS — K219 Gastro-esophageal reflux disease without esophagitis: Secondary | ICD-10-CM | POA: Diagnosis not present

## 2016-01-05 DIAGNOSIS — R140 Abdominal distension (gaseous): Secondary | ICD-10-CM | POA: Diagnosis not present

## 2016-01-12 DIAGNOSIS — M5136 Other intervertebral disc degeneration, lumbar region: Secondary | ICD-10-CM | POA: Diagnosis not present

## 2016-01-12 DIAGNOSIS — M1120 Other chondrocalcinosis, unspecified site: Secondary | ICD-10-CM | POA: Diagnosis not present

## 2016-01-12 DIAGNOSIS — M0589 Other rheumatoid arthritis with rheumatoid factor of multiple sites: Secondary | ICD-10-CM | POA: Diagnosis not present

## 2016-01-12 DIAGNOSIS — M150 Primary generalized (osteo)arthritis: Secondary | ICD-10-CM | POA: Diagnosis not present

## 2016-01-18 ENCOUNTER — Other Ambulatory Visit: Payer: Self-pay

## 2016-01-18 DIAGNOSIS — Z1231 Encounter for screening mammogram for malignant neoplasm of breast: Secondary | ICD-10-CM

## 2016-01-18 DIAGNOSIS — Z853 Personal history of malignant neoplasm of breast: Secondary | ICD-10-CM

## 2016-01-25 DIAGNOSIS — M150 Primary generalized (osteo)arthritis: Secondary | ICD-10-CM | POA: Diagnosis not present

## 2016-01-25 DIAGNOSIS — M1120 Other chondrocalcinosis, unspecified site: Secondary | ICD-10-CM | POA: Diagnosis not present

## 2016-01-25 DIAGNOSIS — M0589 Other rheumatoid arthritis with rheumatoid factor of multiple sites: Secondary | ICD-10-CM | POA: Diagnosis not present

## 2016-01-25 DIAGNOSIS — M5136 Other intervertebral disc degeneration, lumbar region: Secondary | ICD-10-CM | POA: Diagnosis not present

## 2016-02-03 DIAGNOSIS — M0589 Other rheumatoid arthritis with rheumatoid factor of multiple sites: Secondary | ICD-10-CM | POA: Diagnosis not present

## 2016-02-07 DIAGNOSIS — D225 Melanocytic nevi of trunk: Secondary | ICD-10-CM | POA: Diagnosis not present

## 2016-02-07 DIAGNOSIS — L738 Other specified follicular disorders: Secondary | ICD-10-CM | POA: Diagnosis not present

## 2016-02-07 DIAGNOSIS — L603 Nail dystrophy: Secondary | ICD-10-CM | POA: Diagnosis not present

## 2016-02-07 DIAGNOSIS — L821 Other seborrheic keratosis: Secondary | ICD-10-CM | POA: Diagnosis not present

## 2016-02-07 DIAGNOSIS — D1801 Hemangioma of skin and subcutaneous tissue: Secondary | ICD-10-CM | POA: Diagnosis not present

## 2016-02-07 DIAGNOSIS — L570 Actinic keratosis: Secondary | ICD-10-CM | POA: Diagnosis not present

## 2016-02-07 DIAGNOSIS — I788 Other diseases of capillaries: Secondary | ICD-10-CM | POA: Diagnosis not present

## 2016-02-07 DIAGNOSIS — L565 Disseminated superficial actinic porokeratosis (DSAP): Secondary | ICD-10-CM | POA: Diagnosis not present

## 2016-02-21 ENCOUNTER — Ambulatory Visit
Admission: RE | Admit: 2016-02-21 | Discharge: 2016-02-21 | Disposition: A | Discharge status: Home / Self Care | Payer: Medicare Other | Source: Ambulatory Visit

## 2016-02-21 DIAGNOSIS — Z853 Personal history of malignant neoplasm of breast: Secondary | ICD-10-CM

## 2016-02-21 DIAGNOSIS — Z1231 Encounter for screening mammogram for malignant neoplasm of breast: Secondary | ICD-10-CM | POA: Diagnosis not present

## 2016-02-29 DIAGNOSIS — E871 Hypo-osmolality and hyponatremia: Secondary | ICD-10-CM | POA: Diagnosis not present

## 2016-02-29 DIAGNOSIS — J309 Allergic rhinitis, unspecified: Secondary | ICD-10-CM | POA: Diagnosis not present

## 2016-02-29 DIAGNOSIS — R3981 Functional urinary incontinence: Secondary | ICD-10-CM | POA: Diagnosis not present

## 2016-02-29 DIAGNOSIS — R110 Nausea: Secondary | ICD-10-CM | POA: Diagnosis not present

## 2016-02-29 DIAGNOSIS — R5383 Other fatigue: Secondary | ICD-10-CM | POA: Diagnosis not present

## 2016-03-02 DIAGNOSIS — M0589 Other rheumatoid arthritis with rheumatoid factor of multiple sites: Secondary | ICD-10-CM | POA: Diagnosis not present

## 2016-03-07 DIAGNOSIS — R5383 Other fatigue: Secondary | ICD-10-CM | POA: Diagnosis not present

## 2016-03-09 ENCOUNTER — Ambulatory Visit
Admission: RE | Admit: 2016-03-09 | Discharge: 2016-03-09 | Disposition: A | Discharge status: Home / Self Care | Payer: Medicare Other | Source: Ambulatory Visit | Attending: Geriatric Medicine | Admitting: Geriatric Medicine

## 2016-03-09 ENCOUNTER — Emergency Department (HOSPITAL_COMMUNITY): Payer: Medicare Other

## 2016-03-09 ENCOUNTER — Other Ambulatory Visit: Payer: Self-pay | Admitting: Geriatric Medicine

## 2016-03-09 ENCOUNTER — Encounter (HOSPITAL_COMMUNITY): Payer: Self-pay

## 2016-03-09 ENCOUNTER — Emergency Department (HOSPITAL_COMMUNITY)
Admission: EM | Admit: 2016-03-09 | Discharge: 2016-03-09 | Disposition: A | Discharge status: Home / Self Care | Payer: Medicare Other | Attending: Emergency Medicine | Admitting: Emergency Medicine

## 2016-03-09 DIAGNOSIS — R011 Cardiac murmur, unspecified: Secondary | ICD-10-CM | POA: Insufficient documentation

## 2016-03-09 DIAGNOSIS — Z91040 Latex allergy status: Secondary | ICD-10-CM | POA: Insufficient documentation

## 2016-03-09 DIAGNOSIS — J90 Pleural effusion, not elsewhere classified: Secondary | ICD-10-CM | POA: Diagnosis not present

## 2016-03-09 DIAGNOSIS — Z853 Personal history of malignant neoplasm of breast: Secondary | ICD-10-CM | POA: Insufficient documentation

## 2016-03-09 DIAGNOSIS — K219 Gastro-esophageal reflux disease without esophagitis: Secondary | ICD-10-CM | POA: Diagnosis not present

## 2016-03-09 DIAGNOSIS — R079 Chest pain, unspecified: Secondary | ICD-10-CM | POA: Diagnosis present

## 2016-03-09 DIAGNOSIS — J189 Pneumonia, unspecified organism: Secondary | ICD-10-CM | POA: Diagnosis not present

## 2016-03-09 DIAGNOSIS — M1990 Unspecified osteoarthritis, unspecified site: Secondary | ICD-10-CM | POA: Diagnosis not present

## 2016-03-09 DIAGNOSIS — R0789 Other chest pain: Secondary | ICD-10-CM

## 2016-03-09 DIAGNOSIS — Z79899 Other long term (current) drug therapy: Secondary | ICD-10-CM | POA: Diagnosis not present

## 2016-03-09 DIAGNOSIS — R5383 Other fatigue: Secondary | ICD-10-CM | POA: Diagnosis not present

## 2016-03-09 DIAGNOSIS — E871 Hypo-osmolality and hyponatremia: Secondary | ICD-10-CM | POA: Diagnosis not present

## 2016-03-09 DIAGNOSIS — R0602 Shortness of breath: Secondary | ICD-10-CM | POA: Diagnosis not present

## 2016-03-09 DIAGNOSIS — I10 Essential (primary) hypertension: Secondary | ICD-10-CM | POA: Insufficient documentation

## 2016-03-09 DIAGNOSIS — J181 Lobar pneumonia, unspecified organism: Secondary | ICD-10-CM

## 2016-03-09 DIAGNOSIS — H409 Unspecified glaucoma: Secondary | ICD-10-CM | POA: Insufficient documentation

## 2016-03-09 LAB — CBC WITH DIFFERENTIAL/PLATELET
BASOS PCT: 1 %
Basophils Absolute: 0.1 10*3/uL (ref 0.0–0.1)
Eosinophils Absolute: 0.6 10*3/uL (ref 0.0–0.7)
Eosinophils Relative: 5 %
HEMATOCRIT: 36 % (ref 36.0–46.0)
Hemoglobin: 12.3 g/dL (ref 12.0–15.0)
LYMPHS PCT: 29 %
Lymphs Abs: 3.4 10*3/uL (ref 0.7–4.0)
MCH: 32.8 pg (ref 26.0–34.0)
MCHC: 34.2 g/dL (ref 30.0–36.0)
MCV: 96 fL (ref 78.0–100.0)
MONO ABS: 1 10*3/uL (ref 0.1–1.0)
MONOS PCT: 9 %
NEUTROS ABS: 6.9 10*3/uL (ref 1.7–7.7)
Neutrophils Relative %: 58 %
Platelets: 557 10*3/uL — ABNORMAL HIGH (ref 150–400)
RBC: 3.75 MIL/uL — ABNORMAL LOW (ref 3.87–5.11)
RDW: 14.3 % (ref 11.5–15.5)
WBC: 11.9 10*3/uL — ABNORMAL HIGH (ref 4.0–10.5)

## 2016-03-09 LAB — BASIC METABOLIC PANEL
Anion gap: 10 (ref 5–15)
BUN: 11 mg/dL (ref 6–20)
CALCIUM: 9.5 mg/dL (ref 8.9–10.3)
CO2: 26 mmol/L (ref 22–32)
CREATININE: 0.56 mg/dL (ref 0.44–1.00)
Chloride: 100 mmol/L — ABNORMAL LOW (ref 101–111)
GFR calc Af Amer: >60 mL/min (ref >60)
GFR calc non Af Amer: >60 mL/min (ref >60)
GLUCOSE: 110 mg/dL — AB (ref 65–99)
Potassium: 4.1 mmol/L (ref 3.5–5.1)
Sodium: 136 mmol/L (ref 135–145)

## 2016-03-09 LAB — PROTIME-INR
INR: 1.13 (ref 0.00–1.49)
Prothrombin Time: 14.3 seconds (ref 11.6–15.2)

## 2016-03-09 LAB — APTT: aPTT: 32 seconds (ref 24–37)

## 2016-03-09 MED ORDER — IOPAMIDOL (ISOVUE-370) INJECTION 76%
100.0000 mL | Freq: Once | INTRAVENOUS | Status: AC | PRN
  Administered 2016-03-09: 100 mL via INTRAVENOUS

## 2016-03-09 MED ORDER — LEVOFLOXACIN 500 MG PO TABS: 500.0000 mg | ORAL_TABLET | Freq: Every day | ORAL | Status: DC

## 2016-03-09 NOTE — Discharge Instructions (Signed)

## 2016-03-09 NOTE — ED Notes (Signed)
PCP called, sending over via car. "Came here today feeling 'lousy.' Right chest wall pain. D-Dimer is elevated, chest x-ray showing upper lobe infiltrate. Evaluate for possible PE/pneumonia. She is currently immunosuppressed from meds."

## 2016-03-09 NOTE — ED Provider Notes (Signed)
CSN: [id number]     Arrival date & time 03/09/16  1626 History   First MD Initiated Contact with Patient 03/09/16 1719     Chief Complaint  Patient presents with  . right chest pain      (Consider location/radiation/quality/duration/timing/severity/associated sxs/prior Treatment) Patient is a 81 y.o. female presenting with chest pain. The history is provided by the patient.  Chest Pain Pain location:  R chest and R lateral chest Pain quality: sharp   Pain radiates to:  Does not radiate Pain radiates to the back: no   Pain severity:  Moderate Onset quality:  Gradual Timing:  Constant Progression:  Unchanged Chronicity: worse over last 3-4 days. Relieved by:  Nothing Worsened by:  Nothing tried Ineffective treatments:  None tried Associated symptoms: cough and shortness of breath (occasional)   Associated symptoms: no fever   Risk factors: no diabetes mellitus     Past Medical History  Diagnosis Date  . PONV (postoperative nausea and vomiting)   . Hypertension   . Heart murmur     as a child   . Peripheral vascular disease (HCC)     varciose veins in right leg   . Recurrent upper respiratory infection (URI)     sinus infection 1/13 2/13 for tx   . GERD (gastroesophageal reflux disease)   . Arthritis     generalized arthrtis   . Cancer [facility])     breast mastectomy - 1996 lung ca 2004- RLL  . Glaucoma    Past Surgical History  Procedure Laterality Date  . Mastectomy      right   . Lobectomy      RLL   . Other surgical history      right rotator cuff surgery   . Eye surgery      bilateral cataract srugery   . Breast biopsy      left breast biopsy 2006   . Dilation and curettage of uterus    . Total knee arthroplasty  02/12/2012    Procedure: TOTAL KNEE ARTHROPLASTY;  Surgeon: Gearlean Alf, MD;  Location: [facility];  Service: Orthopedics;  Laterality: Right;  . Cataract surgery    . Rotator cuff repair     Family History  Problem Relation Age of Onset  .  Stroke Mother   . Stroke Father    Social History  Substance Use Topics  . Smoking status: Never Smoker   . Smokeless tobacco: Never Used  . Alcohol Use: No   OB History    No data available     Review of Systems  Constitutional: Negative for fever.  Respiratory: Positive for cough and shortness of breath (occasional).   Cardiovascular: Positive for chest pain.  All other systems reviewed and are negative.     Allergies  Amlodipine besy-benazepril hcl; Latex; and Xalatan  Home Medications   Prior to Admission medications   Medication Sig Start Date End Date Taking? Authorizing Provider  acetaminophen (TYLENOL) 500 MG tablet Take 1,000 mg by mouth daily. Pt takes 2 tablets twice a day   Yes Historical Provider, MD  atenolol (TENORMIN) 50 MG tablet TK 1 T PO QD 03/01/16  Yes Historical Provider, MD  benazepril (LOTENSIN) 20 MG tablet Take 1 tablet (20 mg total) by mouth daily. 05/04/15  Yes Chauncey Cruel, MD  calcium-vitamin D (OSCAL WITH D) 500-200 MG-UNIT per tablet Take 1 tablet by mouth 3 (three) times daily.    Yes Historical Provider, MD  cholecalciferol (VITAMIN D)  1000 UNITS tablet Take 1,000 Units by mouth daily. Pt takes Vit D 5 x weekly.   Yes Historical Provider, MD  colchicine 0.6 MG tablet Take 0.6 mg by mouth daily. Pt takes 1 tab every other day.   Yes Gavin Pound, MD  dorzolamide-timolol (COSOPT) 22.3-6.8 MG/ML ophthalmic solution Place 1 drop into both eyes 2 (two) times daily.   Yes Historical Provider, MD  folic acid (FOLVITE) 1 MG tablet Take 1 mg by mouth daily.  02/23/14  Yes Historical Provider, MD  golimumab (SIMPONI ARIA) 50 MG/4ML SOLN injection Inject 50 mg into the vein.   Yes Historical Provider, MD  loratadine (CLARITIN) 10 MG tablet Take 10 mg by mouth daily.   Yes Historical Provider, MD  methotrexate (RHEUMATREX) 2.5 MG tablet Take 20 mg by mouth once a week. Pt takes 8 tablets once a week. 04/21/14  Yes Historical Provider, MD  Multiple Vitamin  (MULTIVITAMIN) tablet Take 1 tablet by mouth daily.   Yes Historical Provider, MD  ranitidine (ZANTAC) 150 MG tablet Take 150 mg by mouth 2 (two) times daily.  02/23/14  Yes Historical Provider, MD   BP 175/78 mmHg  Pulse 80  Temp(Src) 98.5 F (36.9 C) (Oral)  SpO2 100% Physical Exam  Constitutional: She is oriented to person, place, and time. She appears well-developed and well-nourished. No distress.  HENT:  Head: Normocephalic.  Eyes: Conjunctivae are normal.  Neck: Neck supple. No tracheal deviation present.  Cardiovascular: Normal rate, regular rhythm and normal heart sounds.   Pulmonary/Chest: Effort normal. No respiratory distress. She has no wheezes. She has no rales. She exhibits no tenderness.  Abdominal: Soft. She exhibits no distension. There is no tenderness.  Neurological: She is alert and oriented to person, place, and time.  Skin: Skin is warm and dry.  Psychiatric: She has a normal mood and affect.  Vitals reviewed.   ED Course  Procedures (including critical care time) Labs Review Labs Reviewed  CBC WITH DIFFERENTIAL/PLATELET - Abnormal; Notable for the following:    WBC 11.9 (*)    RBC 3.75 (*)    Platelets 557 (*)    All other components within normal limits  BASIC METABOLIC PANEL - Abnormal; Notable for the following:    Chloride 100 (*)    Glucose, Bld 110 (*)    All other components within normal limits  APTT  PROTIME-INR    Imaging Review Dg Chest 2 View  03/09/2016  CLINICAL DATA:  Two week history of right-sided chest pain and shortness of breath. History of prior lung carcinoma EXAM: CHEST  2 VIEW COMPARISON:  August 29, 2015 FINDINGS: There is postoperative change on the right with volume loss. There is chronic blunting of the right costophrenic angle. There is airspace opacity in the axillary subsegment of the right upper lobe. The left lung is clear. Heart size is normal. Pulmonary vascularity is normal. No adenopathy evident. There is  atherosclerotic calcification in the aorta. No bone lesions are evident. IMPRESSION: Airspace opacity consistent with pneumonia in the axillary subsegment of the right upper lobe. Postoperative change on the right with volume loss and scarring in the right base. Left lung clear. Stable cardiac silhouette. Followup PA and lateral chest radiographs recommended in 3-4 weeks following trial of antibiotic therapy to ensure resolution and exclude underlying malignancy. These results will be called to the ordering clinician or representative by the Radiologist Assistant, and communication documented in the PACS or zVision Dashboard. Electronically Signed   By: Lowella Grip  III M.D.   On: 03/09/2016 13:54   Ct Angio Chest Pe W/cm &/or Wo Cm  03/09/2016  CLINICAL DATA:  RIGHT-sided chest pain and shortness of breath especially when taking a deep breath or coughing over the last several days, unable to lie flat due to pain, question pulmonary embolism; history of breast cancer, lung cancer, hypertension EXAM: CT ANGIOGRAPHY CHEST WITH CONTRAST TECHNIQUE: Multidetector CT imaging of the chest was performed using the standard protocol during bolus administration of intravenous contrast. Multiplanar CT image reconstructions and MIPs were obtained to evaluate the vascular anatomy. CONTRAST:  67 cc Isovue 370 IV COMPARISON:  CT chest 04/14/2008 FINDINGS: Scattered atherosclerotic calcifications aorta, coronary arteries, and proximal great vessels. Aorta normal caliber without aneurysm or dissection. Pulmonary arteries well opacified and patent. No evidence of pulmonary embolism. Scattered normal sized mediastinal lymph nodes. Post RIGHT mastectomy and RIGHT lower lobectomy. Visualized upper abdomen normal appearance. Loculated pleural effusion RIGHT lung base. Infiltrates identified in RIGHT upper lobe with compressive atelectasis RIGHT middle lobe. Atelectasis at LEFT lower lobe. No pneumothorax. Bones demineralized.  Review of the MIP images confirms the above findings. IMPRESSION: No evidence of pulmonary embolism. Post RIGHT lower lobectomy and RIGHT mastectomy. Loculated pleural effusion at the posterior inferior RIGHT hemi thorax with bibasilar atelectasis. Scattered infiltrates in RIGHT upper lobe. Electronically Signed   By: Lavonia Dana M.D.   On: 03/09/2016 19:06   I have personally reviewed and evaluated these images and lab results as part of my medical decision-making.   EKG Interpretation   Date/Time:  Thursday March 09 2016 16:52:11 EDT Ventricular Rate:  77 PR Interval:  131 QRS Duration: 92 QT Interval:  378 QTC Calculation: 428 R Axis:   65 Text Interpretation:  Sinus rhythm Normal ECG No significant change since  last tracing Confirmed by Alexus Michael MD, Quillian Quince ([id number]) on 03/09/2016 6:05:29 PM      MDM   Final diagnoses:  Right upper lobe pneumonia  Pleural effusion on right   81 y.o. female presents with right sided chest pain, cough, and recent XR concerning for pneumonia. Currently on immunosuppresives for RA which would make her prone to infection. Has cancer history. CT performed to r/o mass or PE. Demonstrated likely pneumonia and parapneumonic effusion. No SIRS or other criteria for inpatient admission. Small effusion likely warrants further examination for malignancy or other etiology given her history. I discussed very close f/u with PCP to schedule IR guided thoracentesis for further examination of the fluid but it is likely related to infection. Started on empiric levaquin.     Leo Grosser, MD 03/10/16 [phone]

## 2016-03-09 NOTE — ED Notes (Signed)
Patient reports that she has been having right chest and SOB, especially when taking a deep breath or coughing for several days and saw PCP today who instructed the patient ot come to the ED to rule out blood clot or a pneumothorax. Patient states she has been unable to lie flat due to pain.

## 2016-03-10 ENCOUNTER — Other Ambulatory Visit (HOSPITAL_COMMUNITY): Payer: Self-pay | Admitting: Geriatric Medicine

## 2016-03-10 DIAGNOSIS — J90 Pleural effusion, not elsewhere classified: Secondary | ICD-10-CM

## 2016-03-14 ENCOUNTER — Ambulatory Visit (HOSPITAL_COMMUNITY)
Admission: RE | Admit: 2016-03-14 | Discharge: 2016-03-14 | Disposition: A | Discharge status: Home / Self Care | Payer: Medicare Other | Source: Ambulatory Visit | Attending: Radiology | Admitting: Radiology

## 2016-03-14 ENCOUNTER — Ambulatory Visit (HOSPITAL_COMMUNITY)
Admission: RE | Admit: 2016-03-14 | Discharge: 2016-03-14 | Disposition: A | Discharge status: Home / Self Care | Payer: Medicare Other | Source: Ambulatory Visit | Attending: Geriatric Medicine | Admitting: Geriatric Medicine

## 2016-03-14 DIAGNOSIS — J90 Pleural effusion, not elsewhere classified: Secondary | ICD-10-CM | POA: Insufficient documentation

## 2016-03-14 DIAGNOSIS — J948 Other specified pleural conditions: Secondary | ICD-10-CM | POA: Diagnosis not present

## 2016-03-14 DIAGNOSIS — J189 Pneumonia, unspecified organism: Secondary | ICD-10-CM | POA: Insufficient documentation

## 2016-03-14 DIAGNOSIS — Z9889 Other specified postprocedural states: Secondary | ICD-10-CM | POA: Insufficient documentation

## 2016-03-14 LAB — BODY FLUID CELL COUNT WITH DIFFERENTIAL
Eos, Fluid: 10 %
Lymphs, Fluid: 76 %
Monocyte-Macrophage-Serous Fluid: 12 % — ABNORMAL LOW (ref 50–90)
NEUTROPHIL FLUID: 2 % (ref 0–25)
Total Nucleated Cell Count, Fluid: 8123 cu mm — ABNORMAL HIGH (ref 0–1000)

## 2016-03-14 LAB — ALBUMIN, FLUID (OTHER): Albumin, Fluid: 2.6 g/dL

## 2016-03-14 LAB — GRAM STAIN

## 2016-03-14 MED ORDER — LIDOCAINE HCL (PF) 1 % IJ SOLN
INTRAMUSCULAR | Status: AC
  Filled 2016-03-14: qty 10

## 2016-03-14 NOTE — Procedures (Signed)
   Rt US guided thoracentesis  180 cc cloudy yellow fluid Tolerated well Sent for labs per MD  cxr pending

## 2016-03-16 DIAGNOSIS — E871 Hypo-osmolality and hyponatremia: Secondary | ICD-10-CM | POA: Diagnosis not present

## 2016-03-16 DIAGNOSIS — Z79899 Other long term (current) drug therapy: Secondary | ICD-10-CM | POA: Diagnosis not present

## 2016-03-19 LAB — CULTURE, BODY FLUID-BOTTLE: CULTURE: NO GROWTH

## 2016-03-19 LAB — CULTURE, BODY FLUID W GRAM STAIN -BOTTLE

## 2016-03-28 DIAGNOSIS — M0589 Other rheumatoid arthritis with rheumatoid factor of multiple sites: Secondary | ICD-10-CM | POA: Diagnosis not present

## 2016-03-28 DIAGNOSIS — M5136 Other intervertebral disc degeneration, lumbar region: Secondary | ICD-10-CM | POA: Diagnosis not present

## 2016-03-28 DIAGNOSIS — M150 Primary generalized (osteo)arthritis: Secondary | ICD-10-CM | POA: Diagnosis not present

## 2016-03-28 DIAGNOSIS — M1120 Other chondrocalcinosis, unspecified site: Secondary | ICD-10-CM | POA: Diagnosis not present

## 2016-03-29 ENCOUNTER — Other Ambulatory Visit: Payer: Self-pay | Admitting: Geriatric Medicine

## 2016-03-29 DIAGNOSIS — K5901 Slow transit constipation: Secondary | ICD-10-CM | POA: Diagnosis not present

## 2016-03-29 DIAGNOSIS — K219 Gastro-esophageal reflux disease without esophagitis: Secondary | ICD-10-CM | POA: Diagnosis not present

## 2016-03-29 DIAGNOSIS — J189 Pneumonia, unspecified organism: Secondary | ICD-10-CM | POA: Diagnosis not present

## 2016-03-29 DIAGNOSIS — E871 Hypo-osmolality and hyponatremia: Secondary | ICD-10-CM | POA: Diagnosis not present

## 2016-03-29 DIAGNOSIS — K648 Other hemorrhoids: Secondary | ICD-10-CM | POA: Diagnosis not present

## 2016-03-29 DIAGNOSIS — I10 Essential (primary) hypertension: Secondary | ICD-10-CM | POA: Diagnosis not present

## 2016-04-05 ENCOUNTER — Ambulatory Visit
Admission: RE | Admit: 2016-04-05 | Discharge: 2016-04-05 | Disposition: A | Discharge status: Home / Self Care | Payer: Medicare Other | Source: Ambulatory Visit | Attending: Geriatric Medicine | Admitting: Geriatric Medicine

## 2016-04-05 DIAGNOSIS — J189 Pneumonia, unspecified organism: Secondary | ICD-10-CM

## 2016-04-07 ENCOUNTER — Other Ambulatory Visit: Payer: Self-pay | Admitting: Geriatric Medicine

## 2016-04-07 ENCOUNTER — Telehealth: Payer: Self-pay | Admitting: *Deleted

## 2016-04-07 DIAGNOSIS — R918 Other nonspecific abnormal finding of lung field: Secondary | ICD-10-CM

## 2016-04-07 NOTE — Telephone Encounter (Signed)
Patient called wanting to know if appts should be cancelled or moved up. She was seen in the ED on 4/20 for pneumonia, had thoracentesis done and chest CT. She is scheduled for labs and CXR on 6/6 and office visit 6/13. She wants to make sure that Dr. Jana Hakim has reviewed all of her reports. She had labwork done through her PCP Dr. Felipa Eth.

## 2016-04-11 ENCOUNTER — Other Ambulatory Visit: Payer: Self-pay

## 2016-04-11 ENCOUNTER — Other Ambulatory Visit: Payer: Self-pay | Admitting: Oncology

## 2016-04-11 ENCOUNTER — Telehealth: Payer: Self-pay

## 2016-04-11 DIAGNOSIS — Z853 Personal history of malignant neoplasm of breast: Secondary | ICD-10-CM

## 2016-04-11 DIAGNOSIS — C3490 Malignant neoplasm of unspecified part of unspecified bronchus or lung: Secondary | ICD-10-CM

## 2016-04-11 NOTE — Telephone Encounter (Signed)
Patient called today regarding recent pneumonia and CT scan.  MD ordered a PET scan to f/u on CT findings.  Patient aware and will wait for scheduling to call her with that appt.

## 2016-04-11 NOTE — Progress Notes (Unsigned)
Karen Kaiser developed a pneumonia which was evaluated with CT scan. After appropriate treatment the pneumonia cleared but there were multiple very small lesions noted in the lungs. These could be reactive or neoplastic. We are going to try to evaluate for metastatic disease with a PET scan. She is ready scheduled to see me within the next 2 weeks.

## 2016-04-13 LAB — FUNGUS CULTURE WITH STAIN

## 2016-04-13 LAB — FUNGAL ORGANISM REFLEX

## 2016-04-13 LAB — FUNGUS CULTURE RESULT

## 2016-04-24 ENCOUNTER — Ambulatory Visit (HOSPITAL_COMMUNITY)
Admission: RE | Admit: 2016-04-24 | Discharge: 2016-04-24 | Disposition: A | Discharge status: Home / Self Care | Payer: Medicare Other | Source: Ambulatory Visit | Attending: Oncology | Admitting: Oncology

## 2016-04-24 ENCOUNTER — Other Ambulatory Visit: Payer: Self-pay | Admitting: *Deleted

## 2016-04-24 DIAGNOSIS — Z853 Personal history of malignant neoplasm of breast: Secondary | ICD-10-CM | POA: Insufficient documentation

## 2016-04-24 DIAGNOSIS — Z9011 Acquired absence of right breast and nipple: Secondary | ICD-10-CM | POA: Diagnosis not present

## 2016-04-24 DIAGNOSIS — Z902 Acquired absence of lung [part of]: Secondary | ICD-10-CM | POA: Diagnosis not present

## 2016-04-24 DIAGNOSIS — C3490 Malignant neoplasm of unspecified part of unspecified bronchus or lung: Secondary | ICD-10-CM | POA: Diagnosis present

## 2016-04-24 DIAGNOSIS — R918 Other nonspecific abnormal finding of lung field: Secondary | ICD-10-CM | POA: Diagnosis not present

## 2016-04-24 LAB — GLUCOSE, CAPILLARY: GLUCOSE-CAPILLARY: 97 mg/dL (ref 65–99)

## 2016-04-24 MED ORDER — FLUDEOXYGLUCOSE F - 18 (FDG) INJECTION
6.0800 | Freq: Once | INTRAVENOUS | Status: AC | PRN
Start: 2016-04-24 — End: 2016-04-24
  Administered 2016-04-24: 6.08 via INTRAVENOUS

## 2016-04-25 ENCOUNTER — Other Ambulatory Visit: Payer: Medicare Other

## 2016-04-27 DIAGNOSIS — N762 Acute vulvitis: Secondary | ICD-10-CM | POA: Diagnosis not present

## 2016-04-27 DIAGNOSIS — N760 Acute vaginitis: Secondary | ICD-10-CM | POA: Diagnosis not present

## 2016-05-02 ENCOUNTER — Ambulatory Visit (HOSPITAL_BASED_OUTPATIENT_CLINIC_OR_DEPARTMENT_OTHER): Payer: Medicare Other | Admitting: Oncology

## 2016-05-02 ENCOUNTER — Telehealth: Payer: Self-pay | Admitting: Oncology

## 2016-05-02 VITALS — BP 200/93 | HR 65 | Temp 97.8°F | Resp 18 | Ht 62.0 in | Wt 126.1 lb

## 2016-05-02 DIAGNOSIS — Z85118 Personal history of other malignant neoplasm of bronchus and lung: Secondary | ICD-10-CM | POA: Diagnosis not present

## 2016-05-02 DIAGNOSIS — C3491 Malignant neoplasm of unspecified part of right bronchus or lung: Secondary | ICD-10-CM

## 2016-05-02 DIAGNOSIS — Z853 Personal history of malignant neoplasm of breast: Secondary | ICD-10-CM

## 2016-05-02 DIAGNOSIS — Z86000 Personal history of in-situ neoplasm of breast: Secondary | ICD-10-CM | POA: Diagnosis not present

## 2016-05-02 NOTE — Telephone Encounter (Signed)
appt made and avs printed. Ct to be scheduled by central radiology

## 2016-05-02 NOTE — Progress Notes (Signed)
ID: Jerrel Ivory   DOB: June 16, 1935  MR#: [id number]  OXB#:[id number]  PCP: Carlena Sax, MD GYN: SU:  OTHER MD: Leigh Aurora, MD;  Gaynelle Arabian, MD;  Clarene Essex, MD  CHIEF COMPLAINTS:  1) Hx of right lung adenocarcinoma      2)  Hx of right DCIS      CURRENT TREATMENT: Observation  HISTORY OF PRESENT ILLNESS:  from the prior summary:  Patient has a remote history of DCIS, and is status post right mastectomy in 1998. She was then diagnosed with lung adenocarcinoma and underwent a right lower lobectomy in April 2004 for 1.9 cm mass. There was no lymph node involvement. Margins were negative. There is no evidence of recurrence thus far, and the patient is followed with observation alone.  Comorbidities include osteoarthritis, and rheumatoid arthritis.  INTERVAL HISTORY: Lenea returns today for follow-up of her history of lung and breast cancers accompanied by her husband [name]. Since her last visit here she had an episode of pneumonia which she associates with being treated with golimumab for her rheumatoid arthritis. She had a course of antibiotics, stopped that medication, and is now back on methotrexate. Currently she has no cough, phlegm production, pleurisy, or shortness of breath problems.  As part of her workup she had a CT scan of the chest 04/05/2016 which showed resolution of her right upper lobe pneumonia, but multiple small nodules throughout both lungs. This suggested further studies on 04/24/2016 she had a PET scan which shows no evidence of metastatic disease. The lung nodules are really too small for PET resolution. They recommended repeat CT in 3-6 months.  REVIEW OF SYSTEMS: She still has occasional "shooting pains" around the surgical breast area. This is not new or more intense or frequent than before. She is a little bit more constipated than usual. She is taking fiber, prunes, and stool softeners for this. She continues to have stress urinary incontinence. Overall she  thinks her arthritis is better on methotrexate, but not enough for her to take regular walks. 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 She also feels she is seeing "too many doctors" right now. Finally your blood pressure medications were changed and she thinks she may need a little bit more titration they are as her blood pressure has been running  high at home. A detailed review of systems today was otherwise stable  PAST MEDICAL HISTORY: Past Medical History  Diagnosis Date  . PONV (postoperative nausea and vomiting)   . Hypertension   . Heart murmur     as a child   . Peripheral vascular disease (HCC)     varciose veins in right leg   . Recurrent upper respiratory infection (URI)     sinus infection 1/13 2/13 for tx   . GERD (gastroesophageal reflux disease)   . Arthritis     generalized arthrtis   . Cancer [facility])     breast mastectomy - 1996 lung ca 2004- RLL  . Glaucoma     PAST SURGICAL HISTORY: Past Surgical History  Procedure Laterality Date  . Mastectomy      right   . Lobectomy      RLL   . Other surgical history      right rotator cuff surgery   . Eye surgery      bilateral cataract srugery   . Breast biopsy      left breast biopsy 2006   . Dilation and curettage of uterus    .  Total knee arthroplasty  02/12/2012    Procedure: TOTAL KNEE ARTHROPLASTY;  Surgeon: Gearlean Alf, MD;  Location: [facility];  Service: Orthopedics;  Laterality: Right;  . Cataract surgery    . Rotator cuff repair      FAMILY HISTORY Family History  Problem Relation Age of Onset  . Stroke Mother   . Stroke Father    Cancer. sister (ovarian, breast) and brother (lung and brain)  Cerebrovascular Accident. mother and father  Diabetes Mellitus. mother and sister  Hypertension. mother, sister and child  Osteoarthritis. mother  Father. deceased age 90, stroke  Mother. deceased age 45, stroke. DM, HTN  SOCIAL HISTORY:  (Reviewed 04/28/2014)  She is  currently retired. Patient is married and lives with her spouse, [name].  Both have children from prior marriages   ADVANCED DIRECTIVES:  In place  HEALTH MAINTENANCE:  (updated 04/28/2014) Social History  Substance Use Topics  . Smoking status: Never Smoker   . Smokeless tobacco: Never Used  . Alcohol Use: No     Colonoscopy:   PAP:  Not on file  Bone density:  March 2015, osteoporosis with a T score -3.1 (followed by Dr. Maxwell Caul)  Lipid panel:  UTD, Dr. Maxwell Caul  Allergies  Allergen Reactions  . Amlodipine Besy-Benazepril Hcl Swelling    '10mg'$  or higher?? Per patient  . Latex Other (See Comments)    Blister/itching/swelling  . Xalatan [Latanoprost] Itching    Current Outpatient Prescriptions  Medication Sig Dispense Refill  . acetaminophen (TYLENOL) 500 MG tablet Take 1,000 mg by mouth daily. Pt takes 2 tablets twice a day    . atenolol (TENORMIN) 50 MG tablet TK 1 T PO QD  6  . benazepril (LOTENSIN) 20 MG tablet Take 1 tablet (20 mg total) by mouth daily.    . calcium-vitamin D (OSCAL WITH D) 500-200 MG-UNIT per tablet Take 1 tablet by mouth 3 (three) times daily.     . cholecalciferol (VITAMIN D) 1000 UNITS tablet Take 1,000 Units by mouth daily. Pt takes Vit D 5 x weekly.    . colchicine 0.6 MG tablet Take 0.6 mg by mouth daily. Pt takes 1 tab every other day.    . dorzolamide-timolol (COSOPT) 22.3-6.8 MG/ML ophthalmic solution Place 1 drop into both eyes 2 (two) times daily.    . folic acid (FOLVITE) 1 MG tablet Take 1 mg by mouth daily.     Marland Kitchen golimumab (SIMPONI ARIA) 50 MG/4ML SOLN injection Inject 50 mg into the vein.    Marland Kitchen levofloxacin (LEVAQUIN) 500 MG tablet Take 1 tablet (500 mg total) by mouth daily. 10 tablet 0  . loratadine (CLARITIN) 10 MG tablet Take 10 mg by mouth daily.    . methotrexate (RHEUMATREX) 2.5 MG tablet Take 20 mg by mouth once a week. Pt takes 8 tablets once a week.    . Multiple Vitamin (MULTIVITAMIN) tablet Take 1 tablet by mouth daily.    .  ranitidine (ZANTAC) 150 MG tablet Take 150 mg by mouth 2 (two) times daily.      No current facility-administered medications for this visit.    OBJECTIVE: Elderly white woman In no acute distress  Filed Vitals:   05/02/16 1327  BP: 200/93  Pulse: 65  Temp: 97.8 F (36.6 C)  Resp: 18     Body mass index is 23.06 kg/(m^2).    ECOG FS: 1 Filed Weights   05/02/16 1327  Weight: 126 lb 1.6 oz (57.199 kg)   Sclerae unicteric, EOMs intact  Oropharynx clear, dentition in good repair No cervical or supraclavicular adenopathy Lungs no rales or rhonchi Heart regular rate and rhythm Abd soft, nontender, positive bowel sounds MSK no focal spinal tenderness, no upper extremity lymphedema Neuro: nonfocal, well oriented, appropriate affect Breasts: The right breast is status post mastectomy. There is no evidence of local recurrence. The right axilla is benign. The left breast is unremarkable   LAB RESULTS:   Lab Results  Component Value Date   WBC 11.9* 03/09/2016   NEUTROABS 6.9 03/09/2016   HGB 12.3 03/09/2016   HCT 36.0 03/09/2016   MCV 96.0 03/09/2016   PLT 557* 03/09/2016      Chemistry      Component Value Date/Time   NA 136 03/09/2016 1756   NA 137 04/27/2015 1050   K 4.1 03/09/2016 1756   K 4.2 04/27/2015 1050   CL 100* 03/09/2016 1756   CO2 26 03/09/2016 1756   CO2 28 04/27/2015 1050   BUN 11 03/09/2016 1756   BUN 16.7 04/27/2015 1050   CREATININE 0.56 03/09/2016 1756   CREATININE 0.7 04/27/2015 1050      Component Value Date/Time   CALCIUM 9.5 03/09/2016 1756   CALCIUM 9.6 04/27/2015 1050   ALKPHOS 65 04/27/2015 1050   ALKPHOS 68 02/02/2012 1515   AST 20 04/27/2015 1050   AST 17 02/02/2012 1515   ALT 16 04/27/2015 1050   ALT 19 02/02/2012 1515   BILITOT 0.97 04/27/2015 1050   BILITOT 0.4 02/02/2012 1515       STUDIES: Ct Chest Wo Contrast  04/05/2016  CLINICAL DATA:  Right lung pneumonia. Shortness of breath on exertion chest soreness. History of  right mastectomy for breast cancer and lobectomy for right lung cancer. EXAM: CT CHEST WITHOUT CONTRAST TECHNIQUE: Multidetector CT imaging of the chest was performed following the standard protocol without IV contrast. COMPARISON:  Chest radiograph 03/14/2016. Chest CT 03/09/2016 and 04/14/2008. FINDINGS: Mediastinum/Lymph Nodes: No enlarged axillary, mediastinal, or hilar lymph nodes are identified. Mild aortic and coronary artery atherosclerosis is noted. Lungs/Pleura: Loculated right pleural effusion on the prior study has essentially resolved, with only minimal residual pleural fluid or pleural thickening present posteriorly. There is no left pleural effusion. Sequelae of right lower lobectomy are again identified. Mild scarring is present in the lung apices as well as lung bases. Areas of right upper lobe consolidation and ground-glass opacity on the prior CT have essentially resolved, with minimal residual subpleural opacity laterally along the inferior aspect of the major fissure, possibly scarring. Right middle lobe atelectasis has resolved. There are numerous small nodules throughout both lungs measuring up to 5 mm in size (annotated on axial lung series 4). These appear to have largely been present on the prior CT, although some were obscured by airspace consolidation. However, these nodules are new from 2009. Upper abdomen: 1.1 cm low-density lesion in the left hepatic lobe is unchanged from 2009 and benign in appearance. Scattered right hepatic lobe calcifications are noted. Musculoskeletal: Prior right mastectomy. No suspicious lytic or blastic osseous lesion is identified. IMPRESSION: 1. Interval resolution of right upper lobe pneumonia and right pleural effusion. 2. Numerous small nodules throughout both lungs, grossly similar to last month's CT but new from 2009. Given patient's history of multiple malignancies, follow-up chest CT is recommended in 3 months. Electronically Signed   By: Logan Bores  M.D.   On: 04/05/2016 15:20   Nm Pet Image Initial (pi) Skull Base To Thigh  04/24/2016  CLINICAL DATA:  Initial  treatment strategy for staging of adenocarcinoma of the lung. History of lung cancer and breast cancer. EXAM: NUCLEAR MEDICINE PET SKULL BASE TO THIGH TECHNIQUE: 6.1 mCi F-18 FDG was injected intravenously. Full-ring PET imaging was performed from the skull base to thigh after the radiotracer. CT data was obtained and used for attenuation correction and anatomic localization. FASTING BLOOD GLUCOSE:  Value: 97 mg/dl COMPARISON:  Chest CTs, most recent 04/05/2016. FINDINGS: NECK No areas of abnormal hypermetabolism. CHEST No thoracic nodal or pulmonary parenchymal hypermetabolism. ABDOMEN/PELVIS No abdominal pelvic hypermetabolism. SKELETON Right facet hypermetabolism at approximately the C5-6 level is favored to be degenerative and is without well-defined osseous lesion. CT IMAGES PERFORMED FOR ATTENUATION CORRECTION No cervical adenopathy. Right mastectomy. Right lower lobectomy. Small bilateral pulmonary nodules, which are below PET resolution. These are grossly similar to on the 04/05/2016 diagnostic CT. Left hepatic lobe cysts. Normal adrenal glands. Gastric diverticulum is incidentally noted. Abdominal aortic and branch vessel atherosclerosis. IMPRESSION: 1. No areas of hypermetabolism to suggest metastatic disease. 2. Small bilateral pulmonary nodules as detailed on the 04/05/2016 chest CT. These are below PET resolution but warrant followup with chest CT at 3-6 months, given history of multiple primary malignancies. 3. Right mastectomy and right lower lobectomy. Electronically Signed   By: Abigail Miyamoto M.D.   On: 04/24/2016 13:19     ASSESSMENT: 81 y.o. [place] woman status post: 1. Right lower lobectomy April 2004 for a 1.9-cm lung adenocarcinoma, with no lymph node involvement, negative margins, and no evidence of recurrence so far. 2. Status post right mastectomy in 1998 for ductal  carcinoma in situ. 3      She had a left ductectomy February 2005 for what proved to be benign changes. 4       small bilateral pulmonary nodules noted on CT scan of the chest 04/05/2016, below level of PET scan resolution by PET scan 04/24/2016   PLAN: I reassured friend says that her PET scan does not show any evidence of active cancer. Of course she has many small bilateral pulmonary nodules, and possibly the [place] could represent metastatic disease. However they could also be related to her rheumatoid arthritis or 2 other benign conditions. This only needs follow-up at this point.  I think the longer we wait the more information we get from the scans, so my vote would be to instead of getting his chest CT in 3 months, gated in 6 months. A Foley the nodules will be unchanged at that point and then we would repeat one more scan a year later.  She is comfortable with this plan. She is already scheduled to meet with Dr. Felipa Eth in July. She will return to see me the first week in December, with lab work and a chest CT scan prior to that visit.  She knows to call for any problems that may develop before that    Chauncey Cruel, MD    05/02/2016

## 2016-06-06 DIAGNOSIS — K219 Gastro-esophageal reflux disease without esophagitis: Secondary | ICD-10-CM | POA: Diagnosis not present

## 2016-06-06 DIAGNOSIS — I10 Essential (primary) hypertension: Secondary | ICD-10-CM | POA: Diagnosis not present

## 2016-06-06 DIAGNOSIS — Z79899 Other long term (current) drug therapy: Secondary | ICD-10-CM | POA: Diagnosis not present

## 2016-06-06 DIAGNOSIS — R918 Other nonspecific abnormal finding of lung field: Secondary | ICD-10-CM | POA: Diagnosis not present

## 2016-06-14 DIAGNOSIS — M0589 Other rheumatoid arthritis with rheumatoid factor of multiple sites: Secondary | ICD-10-CM | POA: Diagnosis not present

## 2016-06-14 DIAGNOSIS — M150 Primary generalized (osteo)arthritis: Secondary | ICD-10-CM | POA: Diagnosis not present

## 2016-06-14 DIAGNOSIS — M5136 Other intervertebral disc degeneration, lumbar region: Secondary | ICD-10-CM | POA: Diagnosis not present

## 2016-06-14 DIAGNOSIS — M1120 Other chondrocalcinosis, unspecified site: Secondary | ICD-10-CM | POA: Diagnosis not present

## 2016-06-14 DIAGNOSIS — R918 Other nonspecific abnormal finding of lung field: Secondary | ICD-10-CM | POA: Diagnosis not present

## 2016-06-27 DIAGNOSIS — Z779 Other contact with and (suspected) exposures hazardous to health: Secondary | ICD-10-CM | POA: Diagnosis not present

## 2016-06-27 DIAGNOSIS — Z124 Encounter for screening for malignant neoplasm of cervix: Secondary | ICD-10-CM | POA: Diagnosis not present

## 2016-06-27 DIAGNOSIS — Z6824 Body mass index (BMI) 24.0-24.9, adult: Secondary | ICD-10-CM | POA: Diagnosis not present

## 2016-06-29 DIAGNOSIS — H5213 Myopia, bilateral: Secondary | ICD-10-CM | POA: Diagnosis not present

## 2016-06-29 DIAGNOSIS — H401131 Primary open-angle glaucoma, bilateral, mild stage: Secondary | ICD-10-CM | POA: Diagnosis not present

## 2016-06-29 DIAGNOSIS — H52223 Regular astigmatism, bilateral: Secondary | ICD-10-CM | POA: Diagnosis not present

## 2016-06-29 DIAGNOSIS — H524 Presbyopia: Secondary | ICD-10-CM | POA: Diagnosis not present

## 2016-06-29 DIAGNOSIS — Z961 Presence of intraocular lens: Secondary | ICD-10-CM | POA: Diagnosis not present

## 2016-06-29 DIAGNOSIS — H26493 Other secondary cataract, bilateral: Secondary | ICD-10-CM | POA: Diagnosis not present

## 2016-07-07 ENCOUNTER — Other Ambulatory Visit: Payer: Medicare Other

## 2016-07-19 DIAGNOSIS — I10 Essential (primary) hypertension: Secondary | ICD-10-CM | POA: Diagnosis not present

## 2016-07-19 DIAGNOSIS — Z1389 Encounter for screening for other disorder: Secondary | ICD-10-CM | POA: Diagnosis not present

## 2016-07-19 DIAGNOSIS — Z0000 Encounter for general adult medical examination without abnormal findings: Secondary | ICD-10-CM | POA: Diagnosis not present

## 2016-08-03 DIAGNOSIS — Z23 Encounter for immunization: Secondary | ICD-10-CM | POA: Diagnosis not present

## 2016-08-23 ENCOUNTER — Telehealth: Payer: Self-pay | Admitting: *Deleted

## 2016-08-23 NOTE — Telephone Encounter (Signed)
"  [facility] have a fitting at [facility] October 31.  I need orders for bras and other supplies sent to [facility] [place].  Call if needed [phone]."

## 2016-09-05 DIAGNOSIS — L923 Foreign body granuloma of the skin and subcutaneous tissue: Secondary | ICD-10-CM | POA: Diagnosis not present

## 2016-09-11 DIAGNOSIS — H26492 Other secondary cataract, left eye: Secondary | ICD-10-CM | POA: Diagnosis not present

## 2016-09-11 DIAGNOSIS — Z961 Presence of intraocular lens: Secondary | ICD-10-CM | POA: Diagnosis not present

## 2016-09-11 DIAGNOSIS — H26493 Other secondary cataract, bilateral: Secondary | ICD-10-CM | POA: Diagnosis not present

## 2016-09-11 DIAGNOSIS — I10 Essential (primary) hypertension: Secondary | ICD-10-CM | POA: Diagnosis not present

## 2016-09-14 DIAGNOSIS — M1120 Other chondrocalcinosis, unspecified site: Secondary | ICD-10-CM | POA: Diagnosis not present

## 2016-09-14 DIAGNOSIS — M150 Primary generalized (osteo)arthritis: Secondary | ICD-10-CM | POA: Diagnosis not present

## 2016-09-14 DIAGNOSIS — M0589 Other rheumatoid arthritis with rheumatoid factor of multiple sites: Secondary | ICD-10-CM | POA: Diagnosis not present

## 2016-09-14 DIAGNOSIS — R918 Other nonspecific abnormal finding of lung field: Secondary | ICD-10-CM | POA: Diagnosis not present

## 2016-09-15 DIAGNOSIS — Z79899 Other long term (current) drug therapy: Secondary | ICD-10-CM | POA: Diagnosis not present

## 2016-09-15 DIAGNOSIS — I10 Essential (primary) hypertension: Secondary | ICD-10-CM | POA: Diagnosis not present

## 2016-09-18 DIAGNOSIS — Z961 Presence of intraocular lens: Secondary | ICD-10-CM | POA: Diagnosis not present

## 2016-10-11 ENCOUNTER — Ambulatory Visit (HOSPITAL_COMMUNITY): Payer: Medicare Other

## 2016-10-11 IMAGING — CT CT CHEST W/O CM
2 of 4 series · 15 of 30 positions shown, 17 images · non-contrast
Comparison: Chest radiograph 03/14/2016. Chest CT 03/09/2016 and
04/14/2008.

CLINICAL DATA: Right lung pneumonia. Shortness of breath on
exertion chest soreness. History of right mastectomy for breast
cancer and lobectomy for right lung cancer.

EXAM:
CT CHEST WITHOUT CONTRAST
TECHNIQUE: Multidetector CT imaging of the chest was performed following the
standard protocol without IV contrast.

[Series 3: chest w/o · axial · non-contrast · 0.61mm/px · z∈[-259,-16]mm · 8 of 125 slices shown, 10 images]
[im 14/125  mediastinal]
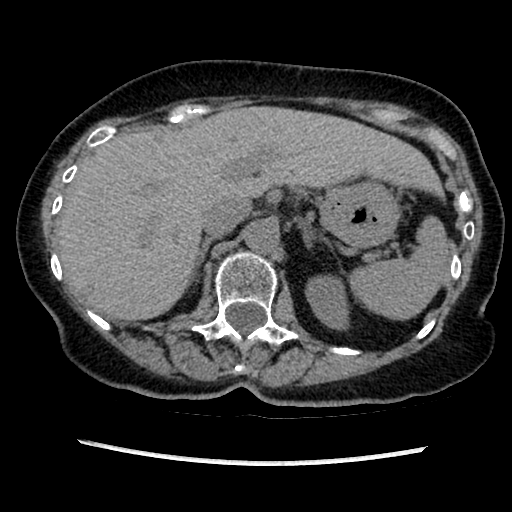
[im 14/125  lung]
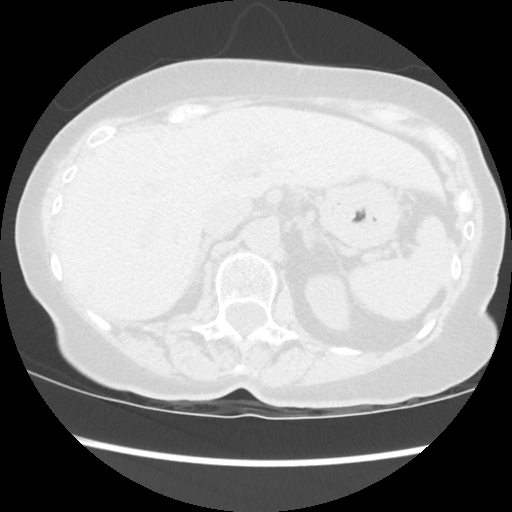
[im 28/125  lung]
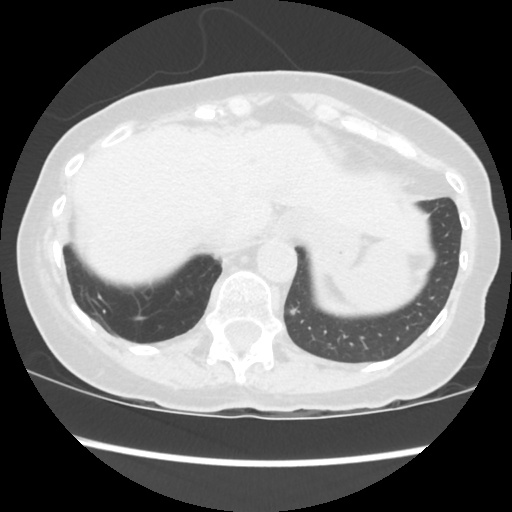
[im 42/125  lung]
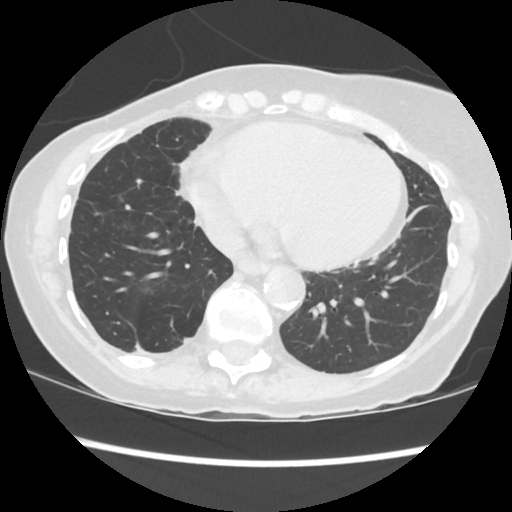
[im 56/125  lung]
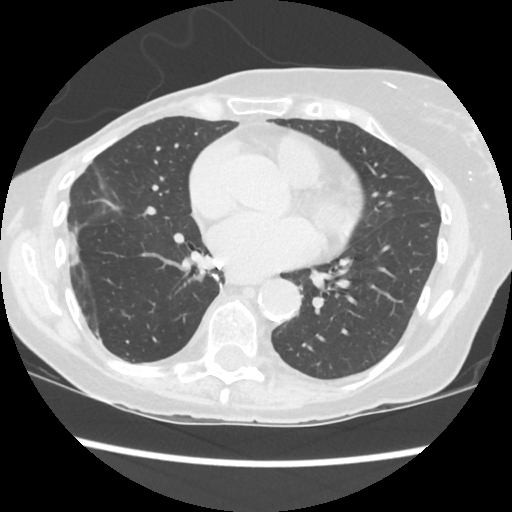
[im 69/125  mediastinal]
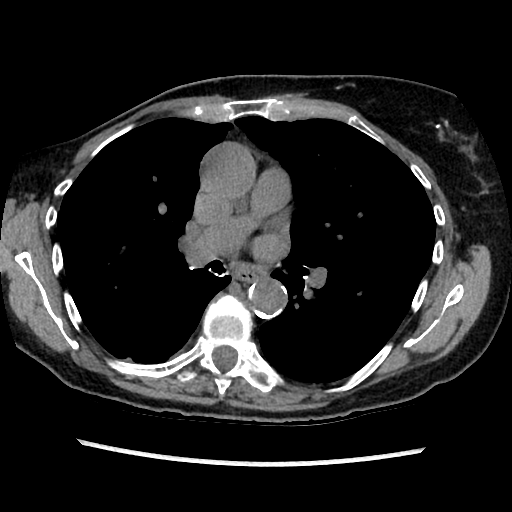
[im 69/125  lung]
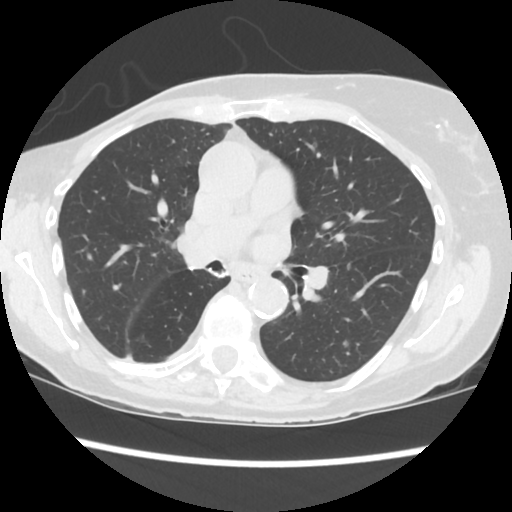
[im 83/125  lung]
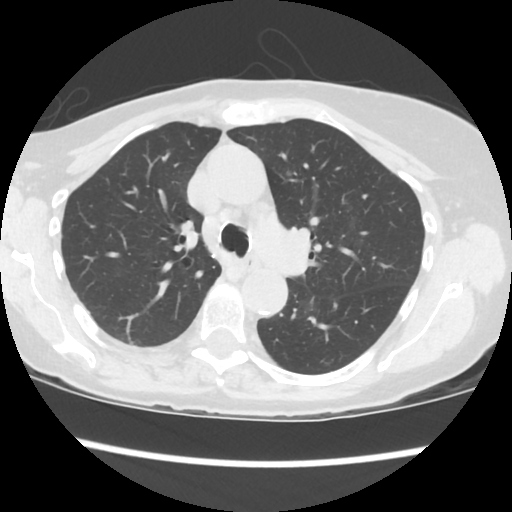
[im 97/125  lung]
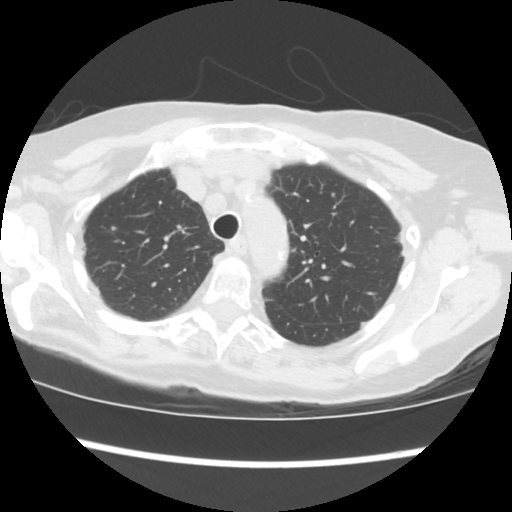
[im 111/125  lung]
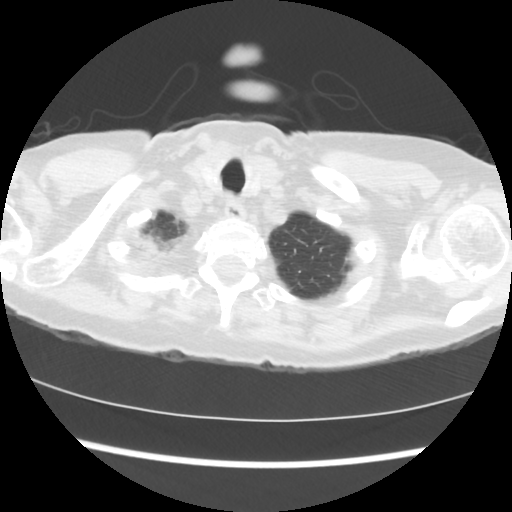

[Series 4: lung windows · axial · 0.61mm/px · z∈[-254,-22]mm · 7 of 125 slices shown]
[im 16/125  lung]
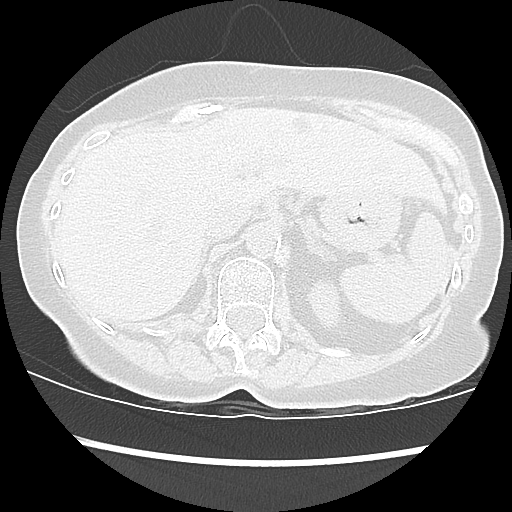
[im 32/125  lung]
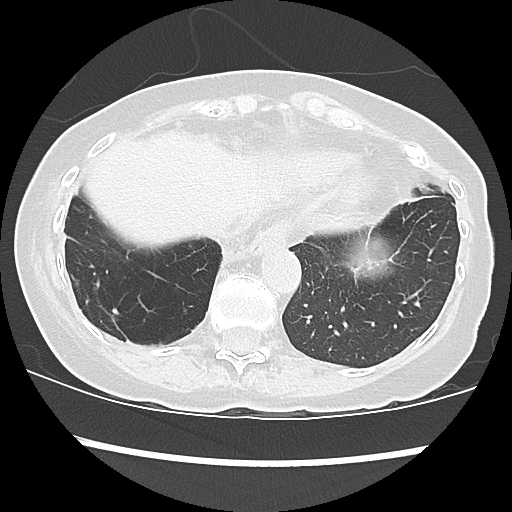
[im 47/125  lung]
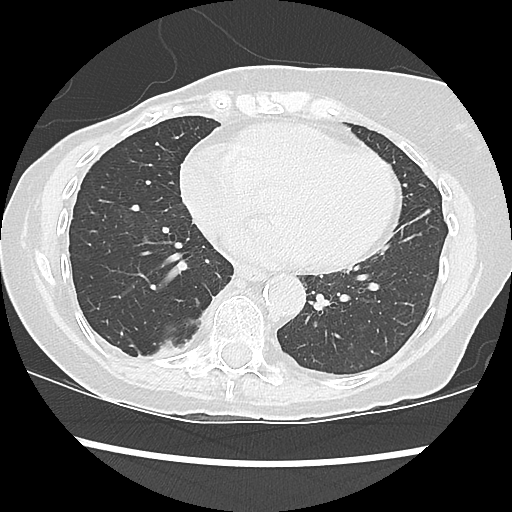
[im 63/125  lung]
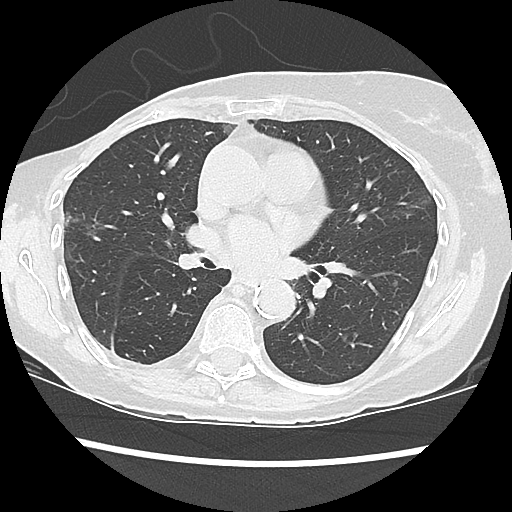
[im 78/125  lung]
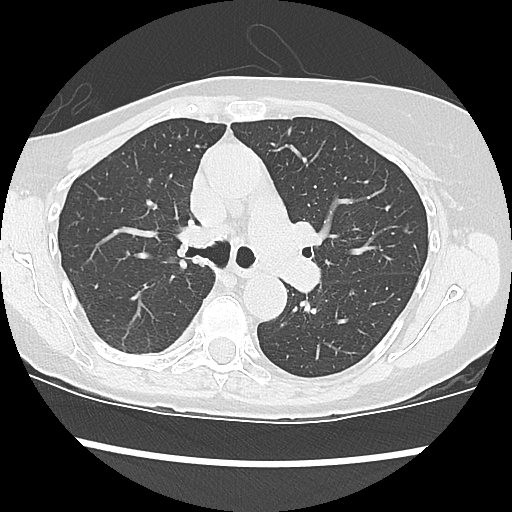
[im 94/125  lung]
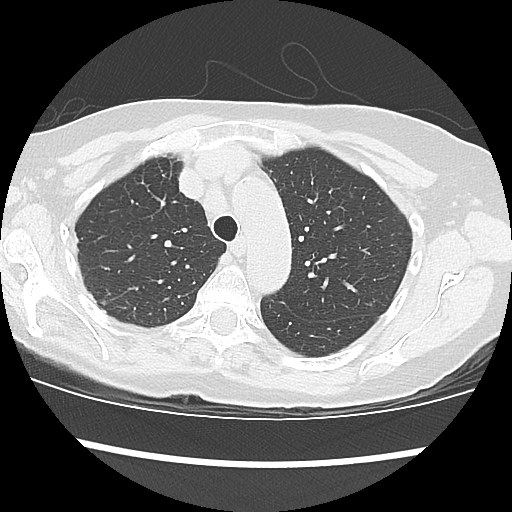
[im 109/125  lung]
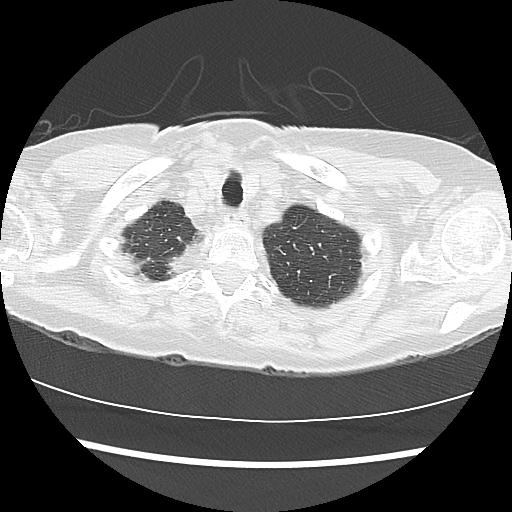

[15 of 30 positions shown; findings below may reference images not displayed]

FINDINGS: Mediastinum/Lymph Nodes: No enlarged axillary, mediastinal, or hilar
lymph nodes are identified. Mild aortic and coronary artery
atherosclerosis is noted.

Lungs/Pleura: Loculated right pleural effusion on the prior study
has essentially resolved, with only minimal residual pleural fluid
or pleural thickening present posteriorly. There is no left pleural
effusion. Sequelae of right lower lobectomy are again identified.
Mild scarring is present in the lung apices as well as lung bases.
Areas of right upper lobe consolidation and ground-glass opacity on
the prior CT have essentially resolved, with minimal residual
subpleural opacity laterally along the inferior aspect of the major
fissure, possibly scarring. Right middle lobe atelectasis has
resolved.

There are numerous small nodules throughout both lungs measuring up
to 5 mm in size (annotated on axial lung series 4). These appear to
have largely been present on the prior CT, although some were
obscured by airspace consolidation. However, these nodules are new
from 9448.

Upper abdomen: 1.1 cm low-density lesion in the left hepatic lobe is
unchanged from 9448 and benign in appearance. Scattered right
hepatic lobe calcifications are noted.

Musculoskeletal: Prior right mastectomy. No suspicious lytic or
blastic osseous lesion is identified.
IMPRESSION: 1. Interval resolution of right upper lobe pneumonia and right
pleural effusion.
2. Numerous small nodules throughout both lungs, grossly similar to
last month's CT but new from 9448. Given patient's history of
multiple malignancies, follow-up chest CT is recommended in 3
months.

## 2016-10-16 ENCOUNTER — Other Ambulatory Visit: Payer: Self-pay

## 2016-10-16 DIAGNOSIS — C3490 Malignant neoplasm of unspecified part of unspecified bronchus or lung: Secondary | ICD-10-CM

## 2016-10-17 ENCOUNTER — Ambulatory Visit (HOSPITAL_COMMUNITY)
Admission: RE | Admit: 2016-10-17 | Discharge: 2016-10-17 | Disposition: A | Discharge status: Home / Self Care | Payer: Medicare Other | Source: Ambulatory Visit | Attending: Oncology | Admitting: Oncology

## 2016-10-17 ENCOUNTER — Other Ambulatory Visit (HOSPITAL_BASED_OUTPATIENT_CLINIC_OR_DEPARTMENT_OTHER): Payer: Medicare Other

## 2016-10-17 DIAGNOSIS — C3490 Malignant neoplasm of unspecified part of unspecified bronchus or lung: Secondary | ICD-10-CM

## 2016-10-17 DIAGNOSIS — Z853 Personal history of malignant neoplasm of breast: Secondary | ICD-10-CM

## 2016-10-17 DIAGNOSIS — R918 Other nonspecific abnormal finding of lung field: Secondary | ICD-10-CM | POA: Insufficient documentation

## 2016-10-17 DIAGNOSIS — C3491 Malignant neoplasm of unspecified part of right bronchus or lung: Secondary | ICD-10-CM | POA: Insufficient documentation

## 2016-10-17 DIAGNOSIS — R911 Solitary pulmonary nodule: Secondary | ICD-10-CM | POA: Diagnosis not present

## 2016-10-17 DIAGNOSIS — Z85118 Personal history of other malignant neoplasm of bronchus and lung: Secondary | ICD-10-CM | POA: Diagnosis present

## 2016-10-17 DIAGNOSIS — Z86000 Personal history of in-situ neoplasm of breast: Secondary | ICD-10-CM

## 2016-10-17 LAB — COMPREHENSIVE METABOLIC PANEL
ALBUMIN: 3.4 g/dL — AB (ref 3.5–5.0)
ALK PHOS: 70 U/L (ref 40–150)
ALT: 14 U/L (ref 0–55)
ANION GAP: 10 meq/L (ref 3–11)
AST: 15 U/L (ref 5–34)
BILIRUBIN TOTAL: 0.58 mg/dL (ref 0.20–1.20)
BUN: 17.2 mg/dL (ref 7.0–26.0)
CALCIUM: 9.9 mg/dL (ref 8.4–10.4)
CO2: 27 mEq/L (ref 22–29)
CREATININE: 0.8 mg/dL (ref 0.6–1.1)
Chloride: 104 mEq/L (ref 98–109)
EGFR: 76 mL/min/{1.73_m2} — AB (ref >90)
Glucose: 96 mg/dl (ref 70–140)
Potassium: 4.2 mEq/L (ref 3.5–5.1)
Sodium: 141 mEq/L (ref 136–145)
TOTAL PROTEIN: 7.2 g/dL (ref 6.4–8.3)

## 2016-10-17 LAB — CBC WITH DIFFERENTIAL/PLATELET
BASO%: 0.6 % (ref 0.0–2.0)
Basophils Absolute: 0.1 10*3/uL (ref 0.0–0.1)
EOS ABS: 0.5 10*3/uL (ref 0.0–0.5)
EOS%: 3.8 % (ref 0.0–7.0)
HEMATOCRIT: 40.4 % (ref 34.8–46.6)
HGB: 13.4 g/dL (ref 11.6–15.9)
LYMPH#: 2.9 10*3/uL (ref 0.9–3.3)
LYMPH%: 23.6 % (ref 14.0–49.7)
MCH: 33 pg (ref 25.1–34.0)
MCHC: 33.1 g/dL (ref 31.5–36.0)
MCV: 99.5 fL (ref 79.5–101.0)
MONO#: 0.9 10*3/uL (ref 0.1–0.9)
MONO%: 7.2 % (ref 0.0–14.0)
NEUT%: 64.8 % (ref 38.4–76.8)
NEUTROS ABS: 7.9 10*3/uL — AB (ref 1.5–6.5)
PLATELETS: 358 10*3/uL (ref 145–400)
RBC: 4.06 10*6/uL (ref 3.70–5.45)
RDW: 14 % (ref 11.2–14.5)
WBC: 12.2 10*3/uL — AB (ref 3.9–10.3)

## 2016-10-20 DIAGNOSIS — K5901 Slow transit constipation: Secondary | ICD-10-CM | POA: Diagnosis not present

## 2016-10-20 DIAGNOSIS — K219 Gastro-esophageal reflux disease without esophagitis: Secondary | ICD-10-CM | POA: Diagnosis not present

## 2016-10-24 ENCOUNTER — Ambulatory Visit (HOSPITAL_BASED_OUTPATIENT_CLINIC_OR_DEPARTMENT_OTHER): Payer: Medicare Other | Admitting: Oncology

## 2016-10-24 VITALS — BP 193/76 | HR 60 | Temp 97.8°F | Resp 17 | Ht 62.0 in | Wt 128.9 lb

## 2016-10-24 DIAGNOSIS — R918 Other nonspecific abnormal finding of lung field: Secondary | ICD-10-CM | POA: Diagnosis not present

## 2016-10-24 DIAGNOSIS — Z85118 Personal history of other malignant neoplasm of bronchus and lung: Secondary | ICD-10-CM

## 2016-10-24 DIAGNOSIS — C3491 Malignant neoplasm of unspecified part of right bronchus or lung: Secondary | ICD-10-CM

## 2016-10-24 NOTE — Progress Notes (Signed)
ID: Karen Kaiser   DOB: 1935-10-05  MR#: [id number]  HEN#:[id number]  PCP: Karen Argyle, MD GYN: SU:  OTHER MD: Karen Aurora, MD;  Karen Arabian, MD;  Karen Essex, MD  CHIEF COMPLAINTS:  1) Hx of right lung adenocarcinoma      2)  Hx of right DCIS      CURRENT TREATMENT: Observation  HISTORY OF PRESENT ILLNESS:  from the prior summary:  Patient has a remote history of DCIS, and is status post right mastectomy in 1998. She was then diagnosed with lung adenocarcinoma and underwent a right lower lobectomy in April 2004 for 1.9 cm mass. There was no lymph node involvement. Margins were negative. There is no evidence of recurrence thus far, and the patient is followed with observation alone.  Comorbidities include osteoarthritis, and rheumatoid arthritis.  INTERVAL HISTORY: Karen Kaiser returns today for follow-up of her lung nodules accompanied by her husband Karen Kaiser. She just had a restaging CT scan of the chest which shows the lung nodules, multiple and non-as large as 5 mm, to be completely stable area   REVIEW OF SYSTEMS: She is having quite a bit of pain from her arthritis and is taking only Tylenol for this. She tolerates the methotrexate well. She tells me she has been given prednisone to take on an as needed basis on special days but she is not quite sure how to do that. She is not using Aleve or Advil at present. 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 She is hoping to get older legal work done and she brought [place] a copy of her living will and health care power of attorney to scan. She feels tired him a bruises easily, and feels anxious. She has no pulmonary symptoms whatsoever. A detailed review of systems today was otherwise stable  PAST MEDICAL HISTORY: Past Medical History:  Diagnosis Date  . Arthritis    generalized arthrtis   . Cancer [facility])    breast mastectomy - 1996 lung  ca 2004- RLL  . GERD (gastroesophageal reflux disease)   . Glaucoma   . Heart murmur    as a child   . Hypertension   . Peripheral vascular disease (HCC)    varciose veins in right leg   . PONV (postoperative nausea and vomiting)   . Recurrent upper respiratory infection (URI)    sinus infection 1/13 2/13 for tx     PAST SURGICAL HISTORY: Past Surgical History:  Procedure Laterality Date  . BREAST BIOPSY     left breast biopsy 2006   . cataract surgery    . DILATION AND CURETTAGE OF UTERUS    . EYE SURGERY     bilateral cataract srugery   . LOBECTOMY     RLL   . MASTECTOMY     right   . OTHER SURGICAL HISTORY     right rotator cuff surgery   . ROTATOR CUFF REPAIR    . TOTAL KNEE ARTHROPLASTY  02/12/2012   Procedure: TOTAL KNEE ARTHROPLASTY;  Surgeon: Karen Alf, MD;  Location: [facility];  Service: Orthopedics;  Laterality: Right;    FAMILY HISTORY Family History  Problem Relation Age of Onset  . Stroke Mother   . Stroke Father    Cancer. sister (ovarian, breast) and brother (lung and brain)  Cerebrovascular Accident. mother and father  Diabetes Mellitus. mother and sister  Hypertension. mother, sister and child  Osteoarthritis. mother  Father. deceased age 56, stroke  Mother. deceased age 64, stroke. DM, HTN  SOCIAL HISTORY:  (Reviewed 04/28/2014)  She is currently retired. Patient is married and lives with her spouse, Karen Kaiser.  Both have children from prior marriages   ADVANCED DIRECTIVES:  In place  HEALTH MAINTENANCE:  (updated 04/28/2014) Social History  Substance Use Topics  . Smoking status: Never Smoker  . Smokeless tobacco: Never Used  . Alcohol use No     Colonoscopy:   PAP:  Not on file  Bone density:  March 2015, osteoporosis with a T score -3.1 (followed by Karen Kaiser)  Lipid panel:  UTD, Karen Kaiser  Allergies  Allergen Reactions  . Amlodipine Besy-Benazepril Hcl Swelling    '10mg'$  or higher?? Per patient  . Latex Other (See Comments)     Blister/itching/swelling  . Xalatan [Latanoprost] Itching    Current Outpatient Prescriptions  Medication Sig Dispense Refill  . bisoprolol (ZEBETA) 10 MG tablet Take 10 mg by mouth daily.    Marland Kitchen acetaminophen (TYLENOL) 500 MG tablet Take 1,000 mg by mouth daily. Pt takes 2 tablets twice a day    . benazepril (LOTENSIN) 20 MG tablet Take 1 tablet (20 mg total) by mouth daily.    . calcium-vitamin D (OSCAL WITH D) 500-200 MG-UNIT per tablet Take 1 tablet by mouth 3 (three) times daily.     . cholecalciferol (VITAMIN D) 1000 UNITS tablet Take 1,000 Units by mouth daily. Pt takes Vit D 5 x weekly.    . colchicine 0.6 MG tablet Take 0.6 mg by mouth daily. Pt takes 1 tab every other day.    . dorzolamide-timolol (COSOPT) 22.3-6.8 MG/ML ophthalmic solution Place 1 drop into both eyes 2 (two) times daily.    . folic acid (FOLVITE) 1 MG tablet Take 1 mg by mouth daily.     Marland Kitchen loratadine (CLARITIN) 10 MG tablet Take 10 mg by mouth daily.    . methotrexate (RHEUMATREX) 2.5 MG tablet Take 20 mg by mouth once a week. Pt takes 8 tablets once a week.    . Multiple Vitamin (MULTIVITAMIN) tablet Take 1 tablet by mouth daily.    . ranitidine (ZANTAC) 150 MG tablet Take 150 mg by mouth 2 (two) times daily.      No current facility-administered medications for this visit.     OBJECTIVE: Elderly white woman Who appears stated age  81:   10/24/16 1113 10/24/16 1115  BP: (!) 207/91 (!) 193/76  Pulse: 60   Resp: 17   Temp: 97.8 F (36.6 C)      Body mass index is 23.58 kg/m.    ECOG FS: 1 Filed Weights   10/24/16 1113  Weight: 128 lb 14.4 oz (58.5 kg)   Sclerae unicteric, pupils round and equal Oropharynx clear and moist-- no thrush or other lesions No cervical or supraclavicular adenopathy Lungs no rales or rhonchi Heart regular rate and rhythm Abd soft, nontender, positive bowel sounds MSK no focal spinal tenderness, no upper extremity lymphedema Neuro: nonfocal, well oriented, appropriate  affect Breasts: The right breast is status post mastectomy with no evidence of chest wall recurrence. The right axilla is benign. The left breast is unremarkable.  LAB RESULTS:   Lab Results  Component Value Date   WBC 12.2 (H) 10/17/2016   NEUTROABS 7.9 (H) 10/17/2016   HGB 13.4 10/17/2016   HCT 40.4 10/17/2016   MCV 99.5 10/17/2016   PLT 358 10/17/2016      Chemistry  Component Value Date/Time   NA 141 10/17/2016 1137   K 4.2 10/17/2016 1137   CL 100 (L) 03/09/2016 1756   CO2 27 10/17/2016 1137   BUN 17.2 10/17/2016 1137   CREATININE 0.8 10/17/2016 1137      Component Value Date/Time   CALCIUM 9.9 10/17/2016 1137   ALKPHOS 70 10/17/2016 1137   AST 15 10/17/2016 1137   ALT 14 10/17/2016 1137   BILITOT 0.58 10/17/2016 1137       STUDIES: Ct Chest Wo Contrast  Result Date: 10/17/2016 CLINICAL DATA:  Feeding followup lung nodule. History of lung cancer diagnosed 2004. EXAM: CT CHEST WITHOUT CONTRAST TECHNIQUE: Multidetector CT imaging of the chest was performed following the standard protocol without IV contrast. COMPARISON:  PET-CT 6 5,017 FINDINGS: Cardiovascular: Coronary artery calcification and aortic atherosclerotic calcification. Mediastinum/Nodes: Coronary artery calcification and aortic atherosclerotic calcification. Lungs/Pleura: Bilateral small pulmonary nodules are not changed in 5 month interval. Stable 5 mm nodule in the RIGHT upper lobe posteriorly (image 31, series 6). Multiple additional sub 5 mm nodules in the RIGHT upper lobe are unchanged (image 56, series 6) In the LEFT lower lobes 4 mm nodule is stable. No new pulmonary nodularity. Upper Abdomen: Low-density lesion in the LEFT hepatic lobe consistent benign hepatic cysts adrenal glands normal. No upper abdominal adenopathy. Musculoskeletal: No aggressive osseous lesion. IMPRESSION: 1. Stable bilateral small pulmonary nodules which are less than 5 mm. The multiplicity and stability favors benign etiology  recommend follow-up CT in 04/2011 months. 2. No skeletal metastasis. Electronically Signed   By: Suzy Bouchard M.D.   On: 10/17/2016 16:27     ASSESSMENT: 81 y.o.  woman status post: 1. Right lower lobectomy April 2004 for a 1.9-cm lung adenocarcinoma, with no lymph node involvement, negative margins, and no evidence of recurrence so far. 2. Status post right mastectomy in 1998 for ductal carcinoma in situ. 3      She had a left ductectomy February 2005 for what proved to be benign changes. 4       small bilateral pulmonary nodules noted on CT scan of the chest 04/05/2016, below level of PET scan resolution by PET scan 04/24/2016   (a) repeat chest CT 10/17/2016 completely stable  PLAN: Armando' CT scan is very reassuring. These small nodules are almost certainly going to be benign. They could be related to her arthritis problems or they could be scar tissue. I don't think they're going to have anything to do with either her breast cancer or her prior lung cancer  She is going to have a repeat chest x-ray in 6 months. Assuming that that is negative aside expect she will then see me again in one year with a repeat CT scan one more time before that visit.  Of course she has significant problems with her blood pressure but she is going to be seeing her primary care physician Dr. Felipa Eth tomorrow.  She knows to call for any other problems that may develop before her next visit here.    Chauncey Cruel, MD    10/24/2016

## 2016-10-25 DIAGNOSIS — I10 Essential (primary) hypertension: Secondary | ICD-10-CM | POA: Diagnosis not present

## 2016-10-27 DIAGNOSIS — H20012 Primary iridocyclitis, left eye: Secondary | ICD-10-CM | POA: Diagnosis not present

## 2016-11-15 DIAGNOSIS — Z79899 Other long term (current) drug therapy: Secondary | ICD-10-CM | POA: Diagnosis not present

## 2016-11-21 DIAGNOSIS — Z79899 Other long term (current) drug therapy: Secondary | ICD-10-CM | POA: Diagnosis not present

## 2016-11-21 DIAGNOSIS — I10 Essential (primary) hypertension: Secondary | ICD-10-CM | POA: Diagnosis not present

## 2016-12-05 DIAGNOSIS — I10 Essential (primary) hypertension: Secondary | ICD-10-CM | POA: Diagnosis not present

## 2016-12-11 DIAGNOSIS — I10 Essential (primary) hypertension: Secondary | ICD-10-CM | POA: Diagnosis not present

## 2016-12-12 DIAGNOSIS — H20012 Primary iridocyclitis, left eye: Secondary | ICD-10-CM | POA: Diagnosis not present

## 2016-12-25 DIAGNOSIS — Z6824 Body mass index (BMI) 24.0-24.9, adult: Secondary | ICD-10-CM | POA: Diagnosis not present

## 2016-12-25 DIAGNOSIS — M150 Primary generalized (osteo)arthritis: Secondary | ICD-10-CM | POA: Diagnosis not present

## 2016-12-25 DIAGNOSIS — M1120 Other chondrocalcinosis, unspecified site: Secondary | ICD-10-CM | POA: Diagnosis not present

## 2016-12-25 DIAGNOSIS — M0589 Other rheumatoid arthritis with rheumatoid factor of multiple sites: Secondary | ICD-10-CM | POA: Diagnosis not present

## 2016-12-25 DIAGNOSIS — R918 Other nonspecific abnormal finding of lung field: Secondary | ICD-10-CM | POA: Diagnosis not present

## 2017-01-02 DIAGNOSIS — H401131 Primary open-angle glaucoma, bilateral, mild stage: Secondary | ICD-10-CM | POA: Diagnosis not present

## 2017-01-16 DIAGNOSIS — J069 Acute upper respiratory infection, unspecified: Secondary | ICD-10-CM | POA: Diagnosis not present

## 2017-01-16 DIAGNOSIS — I10 Essential (primary) hypertension: Secondary | ICD-10-CM | POA: Diagnosis not present

## 2017-01-16 DIAGNOSIS — B9789 Other viral agents as the cause of diseases classified elsewhere: Secondary | ICD-10-CM | POA: Diagnosis not present

## 2017-01-31 ENCOUNTER — Other Ambulatory Visit: Payer: Self-pay | Admitting: Obstetrics and Gynecology

## 2017-01-31 DIAGNOSIS — Z1231 Encounter for screening mammogram for malignant neoplasm of breast: Secondary | ICD-10-CM

## 2017-02-06 DIAGNOSIS — L218 Other seborrheic dermatitis: Secondary | ICD-10-CM | POA: Diagnosis not present

## 2017-02-06 DIAGNOSIS — L821 Other seborrheic keratosis: Secondary | ICD-10-CM | POA: Diagnosis not present

## 2017-02-06 DIAGNOSIS — L814 Other melanin hyperpigmentation: Secondary | ICD-10-CM | POA: Diagnosis not present

## 2017-02-06 DIAGNOSIS — D1801 Hemangioma of skin and subcutaneous tissue: Secondary | ICD-10-CM | POA: Diagnosis not present

## 2017-02-06 DIAGNOSIS — D692 Other nonthrombocytopenic purpura: Secondary | ICD-10-CM | POA: Diagnosis not present

## 2017-02-06 DIAGNOSIS — D225 Melanocytic nevi of trunk: Secondary | ICD-10-CM | POA: Diagnosis not present

## 2017-02-06 DIAGNOSIS — H61002 Unspecified perichondritis of left external ear: Secondary | ICD-10-CM | POA: Diagnosis not present

## 2017-02-07 DIAGNOSIS — I10 Essential (primary) hypertension: Secondary | ICD-10-CM | POA: Diagnosis not present

## 2017-02-07 DIAGNOSIS — M069 Rheumatoid arthritis, unspecified: Secondary | ICD-10-CM | POA: Diagnosis not present

## 2017-02-07 DIAGNOSIS — R05 Cough: Secondary | ICD-10-CM | POA: Diagnosis not present

## 2017-02-15 DIAGNOSIS — Z471 Aftercare following joint replacement surgery: Secondary | ICD-10-CM | POA: Diagnosis not present

## 2017-02-15 DIAGNOSIS — Z96651 Presence of right artificial knee joint: Secondary | ICD-10-CM | POA: Diagnosis not present

## 2017-02-19 DIAGNOSIS — M0589 Other rheumatoid arthritis with rheumatoid factor of multiple sites: Secondary | ICD-10-CM | POA: Diagnosis not present

## 2017-02-19 DIAGNOSIS — Z79899 Other long term (current) drug therapy: Secondary | ICD-10-CM | POA: Diagnosis not present

## 2017-02-22 ENCOUNTER — Ambulatory Visit
Admission: RE | Admit: 2017-02-22 | Discharge: 2017-02-22 | Disposition: A | Discharge status: Home / Self Care | Payer: Medicare Other | Source: Ambulatory Visit | Attending: Obstetrics and Gynecology | Admitting: Obstetrics and Gynecology

## 2017-02-22 DIAGNOSIS — Z1231 Encounter for screening mammogram for malignant neoplasm of breast: Secondary | ICD-10-CM

## 2017-03-05 DIAGNOSIS — M0589 Other rheumatoid arthritis with rheumatoid factor of multiple sites: Secondary | ICD-10-CM | POA: Diagnosis not present

## 2017-03-08 DIAGNOSIS — K921 Melena: Secondary | ICD-10-CM | POA: Diagnosis not present

## 2017-03-08 DIAGNOSIS — K648 Other hemorrhoids: Secondary | ICD-10-CM | POA: Diagnosis not present

## 2017-03-19 DIAGNOSIS — M0589 Other rheumatoid arthritis with rheumatoid factor of multiple sites: Secondary | ICD-10-CM | POA: Diagnosis not present

## 2017-04-05 DIAGNOSIS — Z6824 Body mass index (BMI) 24.0-24.9, adult: Secondary | ICD-10-CM | POA: Diagnosis not present

## 2017-04-05 DIAGNOSIS — M1120 Other chondrocalcinosis, unspecified site: Secondary | ICD-10-CM | POA: Diagnosis not present

## 2017-04-05 DIAGNOSIS — M150 Primary generalized (osteo)arthritis: Secondary | ICD-10-CM | POA: Diagnosis not present

## 2017-04-05 DIAGNOSIS — R918 Other nonspecific abnormal finding of lung field: Secondary | ICD-10-CM | POA: Diagnosis not present

## 2017-04-05 DIAGNOSIS — M0589 Other rheumatoid arthritis with rheumatoid factor of multiple sites: Secondary | ICD-10-CM | POA: Diagnosis not present

## 2017-04-17 DIAGNOSIS — H401131 Primary open-angle glaucoma, bilateral, mild stage: Secondary | ICD-10-CM | POA: Diagnosis not present

## 2017-04-18 DIAGNOSIS — M0589 Other rheumatoid arthritis with rheumatoid factor of multiple sites: Secondary | ICD-10-CM | POA: Diagnosis not present

## 2017-04-24 ENCOUNTER — Ambulatory Visit (HOSPITAL_COMMUNITY)
Admission: RE | Admit: 2017-04-24 | Discharge: 2017-04-24 | Disposition: A | Discharge status: Home / Self Care | Payer: Medicare Other | Source: Ambulatory Visit | Attending: Oncology | Admitting: Oncology

## 2017-04-24 DIAGNOSIS — R918 Other nonspecific abnormal finding of lung field: Secondary | ICD-10-CM | POA: Diagnosis not present

## 2017-04-24 DIAGNOSIS — C3491 Malignant neoplasm of unspecified part of right bronchus or lung: Secondary | ICD-10-CM | POA: Diagnosis not present

## 2017-04-24 DIAGNOSIS — I700 Atherosclerosis of aorta: Secondary | ICD-10-CM | POA: Insufficient documentation

## 2017-04-24 DIAGNOSIS — C3490 Malignant neoplasm of unspecified part of unspecified bronchus or lung: Secondary | ICD-10-CM | POA: Diagnosis not present

## 2017-05-17 DIAGNOSIS — M0589 Other rheumatoid arthritis with rheumatoid factor of multiple sites: Secondary | ICD-10-CM | POA: Diagnosis not present

## 2017-05-17 DIAGNOSIS — Z79899 Other long term (current) drug therapy: Secondary | ICD-10-CM | POA: Diagnosis not present

## 2017-05-21 ENCOUNTER — Telehealth: Payer: Self-pay | Admitting: *Deleted

## 2017-05-21 NOTE — Telephone Encounter (Signed)
FYI "I have not received results of the CXR done June 5th.  A year ago with pneumonia small nodules were seen in both lungs.  Are the nodules indicative of recurrent lung cancer or rheumatoid attack on the lungs."  Provided the following impression/ results.  The nodules are too small to be assessed confidently by radiography.  "I'll keep the CT scans in December before I see Dr.Magrinat."  No further questions.

## 2017-06-14 DIAGNOSIS — M0589 Other rheumatoid arthritis with rheumatoid factor of multiple sites: Secondary | ICD-10-CM | POA: Diagnosis not present

## 2017-06-28 DIAGNOSIS — Z779 Other contact with and (suspected) exposures hazardous to health: Secondary | ICD-10-CM | POA: Diagnosis not present

## 2017-07-03 DIAGNOSIS — Z6824 Body mass index (BMI) 24.0-24.9, adult: Secondary | ICD-10-CM | POA: Diagnosis not present

## 2017-07-03 DIAGNOSIS — M0589 Other rheumatoid arthritis with rheumatoid factor of multiple sites: Secondary | ICD-10-CM | POA: Diagnosis not present

## 2017-07-03 DIAGNOSIS — R918 Other nonspecific abnormal finding of lung field: Secondary | ICD-10-CM | POA: Diagnosis not present

## 2017-07-03 DIAGNOSIS — M1120 Other chondrocalcinosis, unspecified site: Secondary | ICD-10-CM | POA: Diagnosis not present

## 2017-07-03 DIAGNOSIS — M150 Primary generalized (osteo)arthritis: Secondary | ICD-10-CM | POA: Diagnosis not present

## 2017-07-12 DIAGNOSIS — M0589 Other rheumatoid arthritis with rheumatoid factor of multiple sites: Secondary | ICD-10-CM | POA: Diagnosis not present

## 2017-07-18 DIAGNOSIS — H401131 Primary open-angle glaucoma, bilateral, mild stage: Secondary | ICD-10-CM | POA: Diagnosis not present

## 2017-07-25 DIAGNOSIS — I10 Essential (primary) hypertension: Secondary | ICD-10-CM | POA: Diagnosis not present

## 2017-07-25 DIAGNOSIS — Z23 Encounter for immunization: Secondary | ICD-10-CM | POA: Diagnosis not present

## 2017-07-25 DIAGNOSIS — Z0000 Encounter for general adult medical examination without abnormal findings: Secondary | ICD-10-CM | POA: Diagnosis not present

## 2017-07-25 DIAGNOSIS — M810 Age-related osteoporosis without current pathological fracture: Secondary | ICD-10-CM | POA: Diagnosis not present

## 2017-07-25 DIAGNOSIS — Z1389 Encounter for screening for other disorder: Secondary | ICD-10-CM | POA: Diagnosis not present

## 2017-08-09 DIAGNOSIS — M0589 Other rheumatoid arthritis with rheumatoid factor of multiple sites: Secondary | ICD-10-CM | POA: Diagnosis not present

## 2017-08-15 DIAGNOSIS — H401131 Primary open-angle glaucoma, bilateral, mild stage: Secondary | ICD-10-CM | POA: Diagnosis not present

## 2017-08-22 DIAGNOSIS — Z23 Encounter for immunization: Secondary | ICD-10-CM | POA: Diagnosis not present

## 2017-09-04 DIAGNOSIS — M810 Age-related osteoporosis without current pathological fracture: Secondary | ICD-10-CM | POA: Diagnosis not present

## 2017-09-06 DIAGNOSIS — M0589 Other rheumatoid arthritis with rheumatoid factor of multiple sites: Secondary | ICD-10-CM | POA: Diagnosis not present

## 2017-09-13 DIAGNOSIS — I10 Essential (primary) hypertension: Secondary | ICD-10-CM | POA: Diagnosis not present

## 2017-09-13 DIAGNOSIS — M810 Age-related osteoporosis without current pathological fracture: Secondary | ICD-10-CM | POA: Diagnosis not present

## 2017-09-17 DIAGNOSIS — M1990 Unspecified osteoarthritis, unspecified site: Secondary | ICD-10-CM | POA: Diagnosis not present

## 2017-09-17 DIAGNOSIS — M069 Rheumatoid arthritis, unspecified: Secondary | ICD-10-CM | POA: Diagnosis not present

## 2017-09-17 DIAGNOSIS — I10 Essential (primary) hypertension: Secondary | ICD-10-CM | POA: Diagnosis not present

## 2017-09-17 DIAGNOSIS — M810 Age-related osteoporosis without current pathological fracture: Secondary | ICD-10-CM | POA: Diagnosis not present

## 2017-10-04 DIAGNOSIS — Z79899 Other long term (current) drug therapy: Secondary | ICD-10-CM | POA: Diagnosis not present

## 2017-10-04 DIAGNOSIS — M0589 Other rheumatoid arthritis with rheumatoid factor of multiple sites: Secondary | ICD-10-CM | POA: Diagnosis not present

## 2017-10-09 DIAGNOSIS — Z6825 Body mass index (BMI) 25.0-25.9, adult: Secondary | ICD-10-CM | POA: Diagnosis not present

## 2017-10-09 DIAGNOSIS — M150 Primary generalized (osteo)arthritis: Secondary | ICD-10-CM | POA: Diagnosis not present

## 2017-10-09 DIAGNOSIS — M0589 Other rheumatoid arthritis with rheumatoid factor of multiple sites: Secondary | ICD-10-CM | POA: Diagnosis not present

## 2017-10-09 DIAGNOSIS — E663 Overweight: Secondary | ICD-10-CM | POA: Diagnosis not present

## 2017-10-09 DIAGNOSIS — M1120 Other chondrocalcinosis, unspecified site: Secondary | ICD-10-CM | POA: Diagnosis not present

## 2017-10-09 DIAGNOSIS — R918 Other nonspecific abnormal finding of lung field: Secondary | ICD-10-CM | POA: Diagnosis not present

## 2017-10-23 ENCOUNTER — Telehealth: Payer: Self-pay

## 2017-10-23 ENCOUNTER — Other Ambulatory Visit: Payer: Self-pay

## 2017-10-23 DIAGNOSIS — C3491 Malignant neoplasm of unspecified part of right bronchus or lung: Secondary | ICD-10-CM

## 2017-10-23 NOTE — Telephone Encounter (Signed)
Lab results received from Dr Melissa Noon office and faxed to [facility].  Lab appt for 12/5 cancelled per this RN and pt made aware.

## 2017-10-24 ENCOUNTER — Ambulatory Visit (HOSPITAL_COMMUNITY)
Admission: RE | Admit: 2017-10-24 | Discharge: 2017-10-24 | Disposition: A | Discharge status: Home / Self Care | Payer: Medicare Other | Source: Ambulatory Visit | Attending: Oncology | Admitting: Oncology

## 2017-10-24 ENCOUNTER — Other Ambulatory Visit: Payer: Medicare Other

## 2017-10-24 ENCOUNTER — Encounter (HOSPITAL_COMMUNITY): Payer: Self-pay

## 2017-10-24 DIAGNOSIS — C3490 Malignant neoplasm of unspecified part of unspecified bronchus or lung: Secondary | ICD-10-CM | POA: Diagnosis present

## 2017-10-24 DIAGNOSIS — Z85118 Personal history of other malignant neoplasm of bronchus and lung: Secondary | ICD-10-CM | POA: Insufficient documentation

## 2017-10-24 DIAGNOSIS — I700 Atherosclerosis of aorta: Secondary | ICD-10-CM | POA: Insufficient documentation

## 2017-10-24 DIAGNOSIS — Z9011 Acquired absence of right breast and nipple: Secondary | ICD-10-CM | POA: Diagnosis not present

## 2017-10-24 DIAGNOSIS — Z902 Acquired absence of lung [part of]: Secondary | ICD-10-CM | POA: Diagnosis not present

## 2017-10-24 DIAGNOSIS — R918 Other nonspecific abnormal finding of lung field: Secondary | ICD-10-CM | POA: Diagnosis not present

## 2017-10-24 DIAGNOSIS — C3491 Malignant neoplasm of unspecified part of right bronchus or lung: Secondary | ICD-10-CM

## 2017-10-24 MED ORDER — IOPAMIDOL (ISOVUE-300) INJECTION 61%
75.0000 mL | Freq: Once | INTRAVENOUS | Status: AC | PRN
  Administered 2017-10-24: 75 mL via INTRAVENOUS

## 2017-10-24 MED ORDER — IOPAMIDOL (ISOVUE-300) INJECTION 61%
INTRAVENOUS | Status: AC
  Administered 2017-10-24: 75 mL via INTRAVENOUS
  Filled 2017-10-24: qty 75

## 2017-10-25 DIAGNOSIS — I10 Essential (primary) hypertension: Secondary | ICD-10-CM | POA: Diagnosis not present

## 2017-10-25 DIAGNOSIS — M069 Rheumatoid arthritis, unspecified: Secondary | ICD-10-CM | POA: Diagnosis not present

## 2017-10-31 ENCOUNTER — Ambulatory Visit: Payer: Medicare Other | Admitting: Oncology

## 2017-11-01 DIAGNOSIS — M0589 Other rheumatoid arthritis with rheumatoid factor of multiple sites: Secondary | ICD-10-CM | POA: Diagnosis not present

## 2017-11-01 NOTE — Progress Notes (Signed)
ID: Karen Kaiser   DOB: 1935/05/16  MR#: [id number]  YJE#:[id number]  PCP: Lajean Manes, MD GYN: SU:  OTHER MD: Leigh Aurora, MD;  Gaynelle Arabian, MD;  Clarene Essex, MD  CHIEF COMPLAINTS:  1) Hx of right lung adenocarcinoma      2)  Hx of right DCIS      CURRENT TREATMENT: Observation  HISTORY OF PRESENT ILLNESS:  from the prior summary:  Patient has a remote history of DCIS, and is status post right mastectomy in 1998. She was then diagnosed with lung adenocarcinoma and underwent a right lower lobectomy in April 2004 for 1.9 cm mass. There was no lymph node involvement. Margins were negative. There is no evidence of recurrence thus far, and the patient is followed with observation alone.  Comorbidities include osteoarthritis, and rheumatoid arthritis.  INTERVAL HISTORY: [name] returns today for follow-up of her lung cancer accompanied by her husband. She continues on observation alone.  Since her last visit she underwent unilateral left mammography with CAD and tomography on 02/22/2017 at [facility] showing: breast density category C. There was no evidence of malignancy.  She also completed a chest CT on 10/24/2017 showing: Stable bilateral sub 5 mm pulmonary nodules compared to exam 1year prior. No lymphadenopathy. RIGHT mastectomy anatomy   REVIEW OF SYSTEMS: Camira reports that she is doing well for her condition. The reports that she has been staying active, but her arthritis has been giving her trouble lately. She has Orencia infusions every 4 weeks for rheumatoid arthritis in addition to methotrexate. She notes that she is not able to walk as often as she would like. She reports that she doesn't feel any pain while she is sitting, but when she does hurt, she feels some pain in her hands and fingers. She takes tylenol once a day for this. She notes that she hasn't had any infections since having pnuemonia. She denies unusual headaches, visual changes, nausea, vomiting, or  dizziness. There has been no unusual cough, phlegm production, or pleurisy. This been no change in bowel or bladder habits. She denies unexplained fatigue or unexplained weight loss, bleeding, rash, or fever. A detailed review of systems was otherwise stable.   PAST MEDICAL HISTORY: Past Medical History:  Diagnosis Date  . Arthritis    generalized arthrtis   . Cancer [facility])    breast mastectomy - 1996 lung ca 2004- RLL  . GERD (gastroesophageal reflux disease)   . Glaucoma   . Heart murmur    as a child   . Hypertension   . Peripheral vascular disease (HCC)    varciose veins in right leg   . PONV (postoperative nausea and vomiting)   . Recurrent upper respiratory infection (URI)    sinus infection 1/13 2/13 for tx     PAST SURGICAL HISTORY: Past Surgical History:  Procedure Laterality Date  . BREAST BIOPSY     left breast biopsy 2006   . cataract surgery    . DILATION AND CURETTAGE OF UTERUS    . EYE SURGERY     bilateral cataract srugery   . LOBECTOMY     RLL   . MASTECTOMY     right   . OTHER SURGICAL HISTORY     right rotator cuff surgery   . ROTATOR CUFF REPAIR    . TOTAL KNEE ARTHROPLASTY  02/12/2012   Procedure: TOTAL KNEE ARTHROPLASTY;  Surgeon: Gearlean Alf, MD;  Location: [facility];  Service: Orthopedics;  Laterality: Right;  FAMILY HISTORY Family History  Problem Relation Age of Onset  . Stroke Mother   . Stroke Father    Cancer. sister (ovarian, breast) and brother (lung and brain)  Cerebrovascular Accident. mother and father  Diabetes Mellitus. mother and sister  Hypertension. mother, sister and child  Osteoarthritis. mother  Father. deceased age 49, stroke  Mother. deceased age 10, stroke. DM, HTN  SOCIAL HISTORY:  (Reviewed 04/28/2014)  She is currently retired. Patient is married and lives with her spouse, [name].  Both have children from prior marriages   ADVANCED DIRECTIVES:  In place  HEALTH MAINTENANCE:  (updated 04/28/2014) Social History    Tobacco Use  . Smoking status: Never Smoker  . Smokeless tobacco: Never Used  Substance Use Topics  . Alcohol use: No  . Drug use: No     Colonoscopy:   PAP:  Not on file  Bone density:  March 2015, osteoporosis with a T score -3.1 (followed by Dr. Maxwell Caul)  Lipid panel:  UTD, Dr. Maxwell Caul  Allergies  Allergen Reactions  . Amlodipine Besy-Benazepril Hcl Swelling    10mg  or higher?? Per patient  . Latex Other (See Comments)    Blister/itching/swelling  . Xalatan [Latanoprost] Itching    Current Outpatient Medications  Medication Sig Dispense Refill  . acetaminophen (TYLENOL) 500 MG tablet Take 1,000 mg by mouth daily. Pt takes 2 tablets twice a day    . benazepril (LOTENSIN) 20 MG tablet Take 1 tablet (20 mg total) by mouth daily.    . bisoprolol (ZEBETA) 10 MG tablet Take 10 mg by mouth daily.    . calcium-vitamin D (OSCAL WITH D) 500-200 MG-UNIT per tablet Take 1 tablet by mouth 3 (three) times daily.     . cholecalciferol (VITAMIN D) 1000 UNITS tablet Take 1,000 Units by mouth daily. Pt takes Vit D 5 x weekly.    . colchicine 0.6 MG tablet Take 0.6 mg by mouth daily. Pt takes 1 tab every other day.    . dorzolamide-timolol (COSOPT) 22.3-6.8 MG/ML ophthalmic solution Place 1 drop into both eyes 2 (two) times daily.    . folic acid (FOLVITE) 1 MG tablet Take 1 mg by mouth daily.     Marland Kitchen loratadine (CLARITIN) 10 MG tablet Take 10 mg by mouth daily.    . methotrexate (RHEUMATREX) 2.5 MG tablet Take 20 mg by mouth once a week. Pt takes 8 tablets once a week.    . Multiple Vitamin (MULTIVITAMIN) tablet Take 1 tablet by mouth daily.    . ranitidine (ZANTAC) 150 MG tablet Take 150 mg by mouth 2 (two) times daily.      No current facility-administered medications for this visit.     OBJECTIVE: Elderly white woman in no acute distress  Vitals:   11/02/17 1411  BP: (!) 177/88  Pulse: (!) 57  Resp: 20  Temp: 97.8 F (36.6 C)  SpO2: 100%     Body mass index is 24.91 kg/m.     ECOG FS: 1 Filed Weights   11/02/17 1411  Weight: 136 lb 3.2 oz (61.8 kg)   Sclerae unicteric, pupils round and equal Oropharynx clear and moist No cervical or supraclavicular adenopathy Lungs no rales or rhonchi Heart regular rate and rhythm Abd soft, nontender, positive bowel sounds MSK no focal spinal tenderness, no upper extremity lymphedema Neuro: nonfocal, well oriented, appropriate affect Breasts: She has undergone right mastectomy.  There is no evidence of chest wall recurrence.  The left breast is benign.  Both axillae  are benign.  LAB RESULTS:   Lab Results  Component Value Date   WBC 12.2 (H) 10/17/2016   NEUTROABS 7.9 (H) 10/17/2016   HGB 13.4 10/17/2016   HCT 40.4 10/17/2016   MCV 99.5 10/17/2016   PLT 358 10/17/2016      Chemistry      Component Value Date/Time   NA 141 10/17/2016 1137   K 4.2 10/17/2016 1137   CL 100 (L) 03/09/2016 1756   CO2 27 10/17/2016 1137   BUN 17.2 10/17/2016 1137   CREATININE 0.8 10/17/2016 1137      Component Value Date/Time   CALCIUM 9.9 10/17/2016 1137   ALKPHOS 70 10/17/2016 1137   AST 15 10/17/2016 1137   ALT 14 10/17/2016 1137   BILITOT 0.58 10/17/2016 1137       STUDIES: Since her last visit she underwent unilateral left mammography with CAD and tomography on 02/22/2017 at [facility] showing: breast density category C. There was no evidence of malignancy.   Ct Chest W Contrast  Result Date: 10/24/2017 CLINICAL DATA:  Lung cancer diagnosed 2004. RIGHT lower lobectomy. RIGHT breast cancer diagnosed 07/27/2019. EXAM: CT CHEST WITH CONTRAST TECHNIQUE: Multidetector CT imaging of the chest was performed during intravenous contrast administration. CONTRAST:  75 mL Isovue COMPARISON:  None. FINDINGS: Cardiovascular: No significant vascular findings. Normal heart size. No pericardial effusion. Mediastinum/Nodes: No axillary supraclavicular adenopathy. No mediastinal hilar adenopathy. Lungs/Pleura: Bilateral pulmonary  nodules again demonstrated and not changed in size compared prior. These are small less than 5 mm. Example: RIGHT upper lobe 4 mm nodule (image 52, series 5) LEFT upper lobe nodule measuring 4 mm (image 73, series 5) No new pulmonary nodules.  Approximately 8-10 nodules per lung Upper Abdomen: Limited view of the liver, kidneys, pancreas are unremarkable. Normal adrenal glands. Several low-density lesions in the liver consistent benign cysts. Musculoskeletal: No aggressive osseous lesion. RIGHT mastectomy anatomy IMPRESSION: 1. Stable bilateral sub 5 mm pulmonary nodules compared to exam 1 year prior. 2. No lymphadenopathy. 3. RIGHT mastectomy anatomy Aortic Atherosclerosis (ICD10-I70.0). Electronically Signed   By: Suzy Bouchard M.D.   On: 10/24/2017 16:02     ASSESSMENT: 82 y.o. [place] woman status post: 1. Right lower lobectomy April 2004 for a 1.9-cm lung adenocarcinoma, with no lymph node involvement, negative margins, and no evidence of recurrence so far. 2. Status post right mastectomy in 1998 for ductal carcinoma in situ. 3      She had a left ductectomy February 2005 for what proved to be benign changes. 4       small bilateral pulmonary nodules noted on CT scan of the chest 04/05/2016, below level of PET scan resolution by PET scan 04/24/2016   (a) repeat chest CT 10/17/2016 completely stable  (b) chest CT scan 10/24/2017 again stable (8-10 nodules per lung, all less than 5 mm, all unchanged)  PLAN: Rishita' is now 14 years out from definitive surgery for her lung cancer with no evidence of disease recurrence.  This is very favorable.  What we are seeing in her lung is likely to be related to her rheumatoid arthritis and not to cancer.  Were going to do one last CT scan a year from now and after that I do not think any further CTs would be necessary.  She is showing some improvement with her current rheumatoid arthritis treatment and is tolerating the abatacept well.  She is also  up-to-date as far as her follow-up for breast cancer is concerned  She  will see me again in a year.  She knows to call for any problems that may develop before that visit.   Magrinat, Virgie Dad, MD  11/02/17 2:52 PM Medical Oncology and Hematology [facility] [address] [place], [place] [address] Tel. [phone]    Fax. [phone]  This document serves as a record of services personally performed by Lurline Del, MD. It was created on his behalf by Sheron Nightingale, a trained medical scribe. The creation of this record is based on the scribe's personal observations and the provider's statements to them.   I have reviewed the above documentation for accuracy and completeness, and I agree with the above.

## 2017-11-02 ENCOUNTER — Ambulatory Visit (HOSPITAL_BASED_OUTPATIENT_CLINIC_OR_DEPARTMENT_OTHER): Payer: Medicare Other | Admitting: Oncology

## 2017-11-02 ENCOUNTER — Telehealth: Payer: Self-pay | Admitting: Oncology

## 2017-11-02 VITALS — BP 177/88 | HR 57 | Temp 97.8°F | Resp 20 | Wt 136.2 lb

## 2017-11-02 DIAGNOSIS — Z85118 Personal history of other malignant neoplasm of bronchus and lung: Secondary | ICD-10-CM

## 2017-11-02 DIAGNOSIS — Z86000 Personal history of in-situ neoplasm of breast: Secondary | ICD-10-CM

## 2017-11-02 DIAGNOSIS — M05711 Rheumatoid arthritis with rheumatoid factor of right shoulder without organ or systems involvement: Secondary | ICD-10-CM

## 2017-11-02 DIAGNOSIS — C3491 Malignant neoplasm of unspecified part of right bronchus or lung: Secondary | ICD-10-CM

## 2017-11-02 NOTE — Telephone Encounter (Signed)
Gave patient avs with appts per 12/14 los.

## 2017-11-27 DIAGNOSIS — H401132 Primary open-angle glaucoma, bilateral, moderate stage: Secondary | ICD-10-CM | POA: Diagnosis not present

## 2017-11-29 DIAGNOSIS — M0589 Other rheumatoid arthritis with rheumatoid factor of multiple sites: Secondary | ICD-10-CM | POA: Diagnosis not present

## 2017-12-27 DIAGNOSIS — M0589 Other rheumatoid arthritis with rheumatoid factor of multiple sites: Secondary | ICD-10-CM | POA: Diagnosis not present

## 2017-12-27 DIAGNOSIS — Z79899 Other long term (current) drug therapy: Secondary | ICD-10-CM | POA: Diagnosis not present

## 2018-01-15 DIAGNOSIS — R918 Other nonspecific abnormal finding of lung field: Secondary | ICD-10-CM | POA: Diagnosis not present

## 2018-01-15 DIAGNOSIS — Z6825 Body mass index (BMI) 25.0-25.9, adult: Secondary | ICD-10-CM | POA: Diagnosis not present

## 2018-01-15 DIAGNOSIS — M0589 Other rheumatoid arthritis with rheumatoid factor of multiple sites: Secondary | ICD-10-CM | POA: Diagnosis not present

## 2018-01-15 DIAGNOSIS — E663 Overweight: Secondary | ICD-10-CM | POA: Diagnosis not present

## 2018-01-15 DIAGNOSIS — M1120 Other chondrocalcinosis, unspecified site: Secondary | ICD-10-CM | POA: Diagnosis not present

## 2018-01-15 DIAGNOSIS — M150 Primary generalized (osteo)arthritis: Secondary | ICD-10-CM | POA: Diagnosis not present

## 2018-01-15 DIAGNOSIS — J069 Acute upper respiratory infection, unspecified: Secondary | ICD-10-CM | POA: Diagnosis not present

## 2018-01-17 ENCOUNTER — Other Ambulatory Visit: Payer: Self-pay | Admitting: Obstetrics and Gynecology

## 2018-01-17 DIAGNOSIS — Z1231 Encounter for screening mammogram for malignant neoplasm of breast: Secondary | ICD-10-CM

## 2018-01-22 DIAGNOSIS — I10 Essential (primary) hypertension: Secondary | ICD-10-CM | POA: Diagnosis not present

## 2018-01-22 DIAGNOSIS — I700 Atherosclerosis of aorta: Secondary | ICD-10-CM | POA: Diagnosis not present

## 2018-01-22 DIAGNOSIS — M069 Rheumatoid arthritis, unspecified: Secondary | ICD-10-CM | POA: Diagnosis not present

## 2018-01-24 DIAGNOSIS — M0589 Other rheumatoid arthritis with rheumatoid factor of multiple sites: Secondary | ICD-10-CM | POA: Diagnosis not present

## 2018-02-06 DIAGNOSIS — L565 Disseminated superficial actinic porokeratosis (DSAP): Secondary | ICD-10-CM | POA: Diagnosis not present

## 2018-02-06 DIAGNOSIS — D225 Melanocytic nevi of trunk: Secondary | ICD-10-CM | POA: Diagnosis not present

## 2018-02-06 DIAGNOSIS — L821 Other seborrheic keratosis: Secondary | ICD-10-CM | POA: Diagnosis not present

## 2018-02-06 DIAGNOSIS — L570 Actinic keratosis: Secondary | ICD-10-CM | POA: Diagnosis not present

## 2018-02-21 DIAGNOSIS — M0589 Other rheumatoid arthritis with rheumatoid factor of multiple sites: Secondary | ICD-10-CM | POA: Diagnosis not present

## 2018-02-25 DIAGNOSIS — H401132 Primary open-angle glaucoma, bilateral, moderate stage: Secondary | ICD-10-CM | POA: Diagnosis not present

## 2018-02-26 ENCOUNTER — Ambulatory Visit
Admission: RE | Admit: 2018-02-26 | Discharge: 2018-02-26 | Disposition: A | Discharge status: Home / Self Care | Payer: Medicare Other | Source: Ambulatory Visit | Attending: Obstetrics and Gynecology | Admitting: Obstetrics and Gynecology

## 2018-02-26 DIAGNOSIS — Z1231 Encounter for screening mammogram for malignant neoplasm of breast: Secondary | ICD-10-CM | POA: Diagnosis not present

## 2018-03-21 DIAGNOSIS — Z79899 Other long term (current) drug therapy: Secondary | ICD-10-CM | POA: Diagnosis not present

## 2018-03-21 DIAGNOSIS — M0589 Other rheumatoid arthritis with rheumatoid factor of multiple sites: Secondary | ICD-10-CM | POA: Diagnosis not present

## 2018-04-01 DIAGNOSIS — H401132 Primary open-angle glaucoma, bilateral, moderate stage: Secondary | ICD-10-CM | POA: Diagnosis not present

## 2018-04-09 DIAGNOSIS — M150 Primary generalized (osteo)arthritis: Secondary | ICD-10-CM | POA: Diagnosis not present

## 2018-04-09 DIAGNOSIS — E663 Overweight: Secondary | ICD-10-CM | POA: Diagnosis not present

## 2018-04-09 DIAGNOSIS — M1120 Other chondrocalcinosis, unspecified site: Secondary | ICD-10-CM | POA: Diagnosis not present

## 2018-04-09 DIAGNOSIS — R918 Other nonspecific abnormal finding of lung field: Secondary | ICD-10-CM | POA: Diagnosis not present

## 2018-04-09 DIAGNOSIS — R05 Cough: Secondary | ICD-10-CM | POA: Diagnosis not present

## 2018-04-09 DIAGNOSIS — Z6825 Body mass index (BMI) 25.0-25.9, adult: Secondary | ICD-10-CM | POA: Diagnosis not present

## 2018-04-09 DIAGNOSIS — M0589 Other rheumatoid arthritis with rheumatoid factor of multiple sites: Secondary | ICD-10-CM | POA: Diagnosis not present

## 2018-04-18 DIAGNOSIS — M0589 Other rheumatoid arthritis with rheumatoid factor of multiple sites: Secondary | ICD-10-CM | POA: Diagnosis not present

## 2018-04-24 DIAGNOSIS — R05 Cough: Secondary | ICD-10-CM | POA: Diagnosis not present

## 2018-04-24 DIAGNOSIS — I10 Essential (primary) hypertension: Secondary | ICD-10-CM | POA: Diagnosis not present

## 2018-05-01 DIAGNOSIS — H401132 Primary open-angle glaucoma, bilateral, moderate stage: Secondary | ICD-10-CM | POA: Diagnosis not present

## 2018-05-16 DIAGNOSIS — M0589 Other rheumatoid arthritis with rheumatoid factor of multiple sites: Secondary | ICD-10-CM | POA: Diagnosis not present

## 2018-06-03 DIAGNOSIS — Z79899 Other long term (current) drug therapy: Secondary | ICD-10-CM | POA: Diagnosis not present

## 2018-06-03 DIAGNOSIS — I10 Essential (primary) hypertension: Secondary | ICD-10-CM | POA: Diagnosis not present

## 2018-06-13 DIAGNOSIS — M0589 Other rheumatoid arthritis with rheumatoid factor of multiple sites: Secondary | ICD-10-CM | POA: Diagnosis not present

## 2018-06-13 DIAGNOSIS — Z79899 Other long term (current) drug therapy: Secondary | ICD-10-CM | POA: Diagnosis not present

## 2018-06-17 DIAGNOSIS — M7989 Other specified soft tissue disorders: Secondary | ICD-10-CM | POA: Diagnosis not present

## 2018-06-17 DIAGNOSIS — M79672 Pain in left foot: Secondary | ICD-10-CM | POA: Diagnosis not present

## 2018-07-01 DIAGNOSIS — M79672 Pain in left foot: Secondary | ICD-10-CM | POA: Diagnosis not present

## 2018-07-02 DIAGNOSIS — Z01419 Encounter for gynecological examination (general) (routine) without abnormal findings: Secondary | ICD-10-CM | POA: Diagnosis not present

## 2018-07-02 DIAGNOSIS — Z6825 Body mass index (BMI) 25.0-25.9, adult: Secondary | ICD-10-CM | POA: Diagnosis not present

## 2018-07-11 DIAGNOSIS — M0589 Other rheumatoid arthritis with rheumatoid factor of multiple sites: Secondary | ICD-10-CM | POA: Diagnosis not present

## 2018-07-23 DIAGNOSIS — M0589 Other rheumatoid arthritis with rheumatoid factor of multiple sites: Secondary | ICD-10-CM | POA: Diagnosis not present

## 2018-07-23 DIAGNOSIS — R918 Other nonspecific abnormal finding of lung field: Secondary | ICD-10-CM | POA: Diagnosis not present

## 2018-07-23 DIAGNOSIS — M1120 Other chondrocalcinosis, unspecified site: Secondary | ICD-10-CM | POA: Diagnosis not present

## 2018-07-23 DIAGNOSIS — E663 Overweight: Secondary | ICD-10-CM | POA: Diagnosis not present

## 2018-07-23 DIAGNOSIS — Z6825 Body mass index (BMI) 25.0-25.9, adult: Secondary | ICD-10-CM | POA: Diagnosis not present

## 2018-07-23 DIAGNOSIS — M150 Primary generalized (osteo)arthritis: Secondary | ICD-10-CM | POA: Diagnosis not present

## 2018-07-23 DIAGNOSIS — R05 Cough: Secondary | ICD-10-CM | POA: Diagnosis not present

## 2018-08-01 DIAGNOSIS — Z0000 Encounter for general adult medical examination without abnormal findings: Secondary | ICD-10-CM | POA: Diagnosis not present

## 2018-08-01 DIAGNOSIS — M069 Rheumatoid arthritis, unspecified: Secondary | ICD-10-CM | POA: Diagnosis not present

## 2018-08-01 DIAGNOSIS — I10 Essential (primary) hypertension: Secondary | ICD-10-CM | POA: Diagnosis not present

## 2018-08-01 DIAGNOSIS — Z23 Encounter for immunization: Secondary | ICD-10-CM | POA: Diagnosis not present

## 2018-08-01 DIAGNOSIS — Z1389 Encounter for screening for other disorder: Secondary | ICD-10-CM | POA: Diagnosis not present

## 2018-08-01 DIAGNOSIS — K219 Gastro-esophageal reflux disease without esophagitis: Secondary | ICD-10-CM | POA: Diagnosis not present

## 2018-08-08 DIAGNOSIS — M0589 Other rheumatoid arthritis with rheumatoid factor of multiple sites: Secondary | ICD-10-CM | POA: Diagnosis not present

## 2018-08-15 DIAGNOSIS — K219 Gastro-esophageal reflux disease without esophagitis: Secondary | ICD-10-CM | POA: Diagnosis not present

## 2018-08-20 DIAGNOSIS — Z9842 Cataract extraction status, left eye: Secondary | ICD-10-CM | POA: Diagnosis not present

## 2018-08-20 DIAGNOSIS — H26491 Other secondary cataract, right eye: Secondary | ICD-10-CM | POA: Diagnosis not present

## 2018-08-20 DIAGNOSIS — H401132 Primary open-angle glaucoma, bilateral, moderate stage: Secondary | ICD-10-CM | POA: Diagnosis not present

## 2018-08-20 DIAGNOSIS — Z961 Presence of intraocular lens: Secondary | ICD-10-CM | POA: Diagnosis not present

## 2018-09-05 DIAGNOSIS — M0589 Other rheumatoid arthritis with rheumatoid factor of multiple sites: Secondary | ICD-10-CM | POA: Diagnosis not present

## 2018-10-03 DIAGNOSIS — M0589 Other rheumatoid arthritis with rheumatoid factor of multiple sites: Secondary | ICD-10-CM | POA: Diagnosis not present

## 2018-10-23 ENCOUNTER — Telehealth: Payer: Self-pay | Admitting: Oncology

## 2018-10-23 NOTE — Telephone Encounter (Signed)
Scheduled appt per 1/23 sch message - pt is aware of appt date and time  

## 2018-10-28 ENCOUNTER — Other Ambulatory Visit: Payer: Self-pay

## 2018-10-28 DIAGNOSIS — C3491 Malignant neoplasm of unspecified part of right bronchus or lung: Secondary | ICD-10-CM

## 2018-10-29 ENCOUNTER — Ambulatory Visit (HOSPITAL_COMMUNITY)
Admission: RE | Admit: 2018-10-29 | Discharge: 2018-10-29 | Disposition: A | Discharge status: Home / Self Care | Payer: Medicare Other | Source: Ambulatory Visit | Attending: Oncology | Admitting: Oncology

## 2018-10-29 ENCOUNTER — Inpatient Hospital Stay: Payer: Medicare Other | Attending: Oncology

## 2018-10-29 DIAGNOSIS — Z85118 Personal history of other malignant neoplasm of bronchus and lung: Secondary | ICD-10-CM | POA: Diagnosis not present

## 2018-10-29 DIAGNOSIS — C3491 Malignant neoplasm of unspecified part of right bronchus or lung: Secondary | ICD-10-CM

## 2018-10-29 DIAGNOSIS — Z853 Personal history of malignant neoplasm of breast: Secondary | ICD-10-CM | POA: Diagnosis not present

## 2018-10-29 DIAGNOSIS — R918 Other nonspecific abnormal finding of lung field: Secondary | ICD-10-CM | POA: Diagnosis not present

## 2018-10-29 LAB — CBC WITH DIFFERENTIAL (CANCER CENTER ONLY)
Abs Immature Granulocytes: 0.04 10*3/uL (ref 0.00–0.07)
Basophils Absolute: 0 10*3/uL (ref 0.0–0.1)
Basophils Relative: 1 %
Eosinophils Absolute: 0.2 10*3/uL (ref 0.0–0.5)
Eosinophils Relative: 2 %
HCT: 41 % (ref 36.0–46.0)
Hemoglobin: 13.7 g/dL (ref 12.0–15.0)
Immature Granulocytes: 1 %
Lymphocytes Relative: 23 %
Lymphs Abs: 2 10*3/uL (ref 0.7–4.0)
MCH: 33.7 pg (ref 26.0–34.0)
MCHC: 33.4 g/dL (ref 30.0–36.0)
MCV: 101 fL — ABNORMAL HIGH (ref 80.0–100.0)
Monocytes Absolute: 0.9 10*3/uL (ref 0.1–1.0)
Monocytes Relative: 11 %
Neutro Abs: 5.6 10*3/uL (ref 1.7–7.7)
Neutrophils Relative %: 62 %
Platelet Count: 229 10*3/uL (ref 150–400)
RBC: 4.06 MIL/uL (ref 3.87–5.11)
RDW: 14 % (ref 11.5–15.5)
WBC Count: 8.8 10*3/uL (ref 4.0–10.5)
nRBC: 0 % (ref 0.0–0.2)

## 2018-10-29 LAB — CMP (CANCER CENTER ONLY)
ALT: 18 U/L (ref 0–44)
AST: 22 U/L (ref 15–41)
Albumin: 3.9 g/dL (ref 3.5–5.0)
Alkaline Phosphatase: 67 U/L (ref 38–126)
Anion gap: 10 (ref 5–15)
BUN: 14 mg/dL (ref 8–23)
CO2: 26 mmol/L (ref 22–32)
Calcium: 9.6 mg/dL (ref 8.9–10.3)
Chloride: 102 mmol/L (ref 98–111)
Creatinine: 0.78 mg/dL (ref 0.44–1.00)
GFR, Est AFR Am: >60 mL/min (ref >60)
GFR, Estimated: >60 mL/min (ref >60)
Glucose, Bld: 104 mg/dL — ABNORMAL HIGH (ref 70–99)
Potassium: 3.8 mmol/L (ref 3.5–5.1)
Sodium: 138 mmol/L (ref 135–145)
Total Bilirubin: 1 mg/dL (ref 0.3–1.2)
Total Protein: 6.5 g/dL (ref 6.5–8.1)

## 2018-10-29 MED ORDER — IOHEXOL 300 MG/ML  SOLN
75.0000 mL | Freq: Once | INTRAMUSCULAR | Status: AC | PRN
  Administered 2018-10-29: 75 mL via INTRAVENOUS

## 2018-10-29 MED ORDER — SODIUM CHLORIDE (PF) 0.9 % IJ SOLN
INTRAMUSCULAR | Status: AC
  Filled 2018-10-29: qty 50

## 2018-10-31 DIAGNOSIS — M0589 Other rheumatoid arthritis with rheumatoid factor of multiple sites: Secondary | ICD-10-CM | POA: Diagnosis not present

## 2018-11-03 NOTE — Progress Notes (Signed)
ID: Karen Kaiser   DOB: 15-Jan-1935  MR#: [id number]  ENI#:[id number]  Patient Care Team: Lajean Manes, MD as PCP - General (Internal Medicine) Azalea Cedar, Virgie Dad, MD as Consulting Physician (Oncology) Hennie Duos, MD as Consulting Physician (Rheumatology) Gaynelle Arabian, MD as Consulting Physician (Orthopedic Surgery) Clarene Essex, MD as Consulting Physician (Gastroenterology) OTHER MD:   CHIEF COMPLAINTS:  1) Hx of right lung adenocarcinoma      2)  Hx of right DCIS           CURRENT TREATMENT: Observation  HISTORY OF PRESENT ILLNESS:  from the prior summary:  Patient has a remote history of DCIS, and is status post right mastectomy in 1998. She was then diagnosed with lung adenocarcinoma and underwent a right lower lobectomy in April 2004 for 1.9 cm mass. There was no lymph node involvement. Margins were negative. There is no evidence of recurrence thus far, and the patient is followed with observation alone.  Comorbidities include osteoarthritis, and rheumatoid arthritis.  INTERVAL HISTORY: [name] returns today for follow-up of her breast and lung adenocarcinomas.   The patient continues under observation.   Since her last visit here, she underwent digital screening unilateral left mammography with tomography on 02/26/2018, showing Breast Density Category B. There is no mammographic evidence of malignancy.   She also underwent a chest CT on 10/29/2018 showing recurrent/progressive disease, as evidenced by enlargement of thoracic nodes and pulmonary nodules. Prevascular node measures 11 mm on image 52/2 versus 6 mm on the prior. Left suprahilar node measures 11 mm on image 53/2 and is new or significantly enlarged since the prior. Left hilar node of 10 mm, enlarged from 5 mm previously. Bilateral pulmonary nodules are again identified: Index 9 mm posterior right upper lobe nodule on image 81/7 measured 6 mm on the prior exam. Posterior right upper lobe 6 mm nodule on image  50/7 measured 4 mm on the prior. Left upper lobe 6 mm nodule on image 70/7 measured 4 mm on the prior exam (when remeasured). 7 mm left lower lobe pulmonary nodule measures 5 mm on the prior.There is Aortic Atherosclerosis (ICD10-I70.0).    REVIEW OF SYSTEMS: Kao "piddles around" all day, though "I don't get much done." She is unable to really take walks due to the Rheumatoid Arthritis. She notices that she feels short of breath frequently, with intermittent cough. Most of the time her cough is dry, but sometimes she coughs up a clear and frothy mucus. Her mucus has never been green, yellow, or bloody. The patient denies unusual headaches, visual changes, nausea, vomiting, or dizziness. This been no change in bowel or bladder habits. The patient denies unexplained fatigue or unexplained weight loss, bleeding, rash, or fever. A detailed review of systems was otherwise noncontributory.    PAST MEDICAL HISTORY: Past Medical History:  Diagnosis Date  . Arthritis    generalized arthrtis   . Cancer [facility])    breast mastectomy - 1996 lung ca 2004- RLL  . GERD (gastroesophageal reflux disease)   . Glaucoma   . Heart murmur    as a child   . Hypertension   . Peripheral vascular disease (HCC)    varciose veins in right leg   . PONV (postoperative nausea and vomiting)   . Recurrent upper respiratory infection (URI)    sinus infection 1/13 2/13 for tx     PAST SURGICAL HISTORY: Past Surgical History:  Procedure Laterality Date  . BREAST BIOPSY     left breast biopsy  2006   . cataract surgery    . DILATION AND CURETTAGE OF UTERUS    . EYE SURGERY     bilateral cataract srugery   . LOBECTOMY     RLL   . MASTECTOMY     right   . OTHER SURGICAL HISTORY     right rotator cuff surgery   . ROTATOR CUFF REPAIR    . TOTAL KNEE ARTHROPLASTY  02/12/2012   Procedure: TOTAL KNEE ARTHROPLASTY;  Surgeon: Gearlean Alf, MD;  Location: [facility];  Service: Orthopedics;  Laterality: Right;    FAMILY  HISTORY Family History  Problem Relation Age of Onset  . Stroke Mother   . Stroke Father   . Breast cancer Sister 60   Cancer. sister (ovarian, breast) and brother (lung and brain)  Cerebrovascular Accident. mother and father  Diabetes Mellitus. mother and sister  Hypertension. mother, sister and child  Osteoarthritis. mother  Father. deceased age 12, stroke  Mother. deceased age 47, stroke. DM, HTN  SOCIAL HISTORY:  (Reviewed 04/28/2014)  She is currently retired. Patient is married and lives with her spouse, [name].  Both have children from prior marriages   ADVANCED DIRECTIVES:  In place  HEALTH MAINTENANCE:  (updated 04/28/2014) Social History   Tobacco Use  . Smoking status: Never Smoker  . Smokeless tobacco: Never Used  Substance Use Topics  . Alcohol use: No  . Drug use: No     Colonoscopy:   PAP:  Not on file  Bone density:  March 2015, osteoporosis with a T score -3.1 (followed by Dr. Maxwell Caul)  Lipid panel:  UTD, Dr. Maxwell Caul  Allergies  Allergen Reactions  . Amlodipine Besy-Benazepril Hcl Swelling    10mg  or higher?? Per patient  . Latex Other (See Comments)    Blister/itching/swelling  . Xalatan [Latanoprost] Itching    Current Outpatient Medications  Medication Sig Dispense Refill  . acetaminophen (TYLENOL) 500 MG tablet Take 1,000 mg by mouth daily. Pt takes 2 tablets twice a day    . benazepril (LOTENSIN) 20 MG tablet Take 1 tablet (20 mg total) by mouth daily.    . bisoprolol (ZEBETA) 10 MG tablet Take 10 mg by mouth daily.    . calcium-vitamin D (OSCAL WITH D) 500-200 MG-UNIT per tablet Take 1 tablet by mouth 3 (three) times daily.     . cholecalciferol (VITAMIN D) 1000 UNITS tablet Take 1,000 Units by mouth daily. Pt takes Vit D 5 x weekly.    . colchicine 0.6 MG tablet Take 0.6 mg by mouth daily. Pt takes 1 tab every other day.    . dorzolamide-timolol (COSOPT) 22.3-6.8 MG/ML ophthalmic solution Place 1 drop into both eyes 2 (two) times daily.    .  folic acid (FOLVITE) 1 MG tablet Take 1 mg by mouth daily.     Marland Kitchen loratadine (CLARITIN) 10 MG tablet Take 10 mg by mouth daily.    . methotrexate (RHEUMATREX) 2.5 MG tablet Take 20 mg by mouth once a week. Pt takes 8 tablets once a week.    . Multiple Vitamin (MULTIVITAMIN) tablet Take 1 tablet by mouth daily.    . ranitidine (ZANTAC) 150 MG tablet Take 150 mg by mouth 2 (two) times daily.      No current facility-administered medications for this visit.     OBJECTIVE: Elderly white woman in no acute distress  Vitals:   11/04/18 1345  BP: (!) 160/80  Pulse: 67  Resp: 18  Temp: (!) 97.4 F (36.3  C)  SpO2: 99%     Body mass index is 24.53 kg/m.    ECOG FS: 1 Filed Weights   11/04/18 1345  Weight: 134 lb 1.6 oz (60.8 kg)   Sclerae unicteric, EOMs intact Oropharynx clear and moist No cervical or supraclavicular adenopathy Lungs no rales or rhonchi; no cough at all during the course of today's visit Heart regular rate and rhythm Abd soft, nontender, positive bowel sounds MSK no focal spinal tenderness, no upper extremity lymphedema Neuro: nonfocal, well oriented, appropriate affect Breasts: Status post right mastectomy with no evidence of chest wall recurrence.  Left breast is unremarkable.  Both axillae are benign.  LAB RESULTS:   Lab Results  Component Value Date   WBC 8.8 10/29/2018   NEUTROABS 5.6 10/29/2018   HGB 13.7 10/29/2018   HCT 41.0 10/29/2018   MCV 101.0 (H) 10/29/2018   PLT 229 10/29/2018      Chemistry      Component Value Date/Time   NA 138 10/29/2018 1147   NA 141 10/17/2016 1137   K 3.8 10/29/2018 1147   K 4.2 10/17/2016 1137   CL 102 10/29/2018 1147   CO2 26 10/29/2018 1147   CO2 27 10/17/2016 1137   BUN 14 10/29/2018 1147   BUN 17.2 10/17/2016 1137   CREATININE 0.78 10/29/2018 1147   CREATININE 0.8 10/17/2016 1137      Component Value Date/Time   CALCIUM 9.6 10/29/2018 1147   CALCIUM 9.9 10/17/2016 1137   ALKPHOS 67 10/29/2018 1147    ALKPHOS 70 10/17/2016 1137   AST 22 10/29/2018 1147   AST 15 10/17/2016 1137   ALT 18 10/29/2018 1147   ALT 14 10/17/2016 1137   BILITOT 1.0 10/29/2018 1147   BILITOT 0.58 10/17/2016 1137       STUDIES: Ct Chest W Contrast  Result Date: 10/30/2018 CLINICAL DATA:  Lung cancer diagnosed in 2004 with right breast cancer in 1996. Right lower lobectomy. Right mastectomy. Cough. Follow-up of pulmonary nodules. EXAM: CT CHEST WITH CONTRAST TECHNIQUE: Multidetector CT imaging of the chest was performed during intravenous contrast administration. CONTRAST:  17mL OMNIPAQUE IOHEXOL 300 MG/ML  SOLN COMPARISON:  10/24/2017 FINDINGS: Cardiovascular: Aortic and branch vessel atherosclerosis. Mild cardiomegaly, accentuated by a mild pectus excavatum deformity. No pericardial effusion. No central pulmonary embolism, on this non-dedicated study. Mediastinum/Nodes: Normal stomach, No supraclavicular adenopathy. No axillary adenopathy. No middle mediastinal adenopathy. Prevascular node measures 11 mm on image 52/2 versus 6 mm on the prior. Left suprahilar node measures 11 mm on image 53/2 and is new or significantly enlarged since the prior. Left hilar node of 10 mm, enlarged from 5 mm previously. Lungs/Pleura: No pleural fluid.  Right lower lobectomy. Bilateral pulmonary nodules are again identified. Index 9 mm posterior right upper lobe nodule on image 81/7 measured 6 mm on the prior exam. Posterior right upper lobe 6 mm nodule on image 50/7 measured 4 mm on the prior. Left upper lobe 6 mm nodule on image 70/7 measured 4 mm on the prior exam (when remeasured). 7 mm left lower lobe pulmonary nodule measures 5 mm on the prior. Upper Abdomen: Old granulomatous disease in the liver. Well-circumscribed low-density liver lesions are likely cysts. Normal imaged portions of the spleen, stomach, adrenal glands, kidneys. There may be a 6 mm pancreatic body cystic lesion on image 159/2, of doubtful clinical significance.  Musculoskeletal: Right mastectomy.  Osteopenia. IMPRESSION: 1. Recurrent/progressive disease, as evidenced by enlargement of thoracic nodes and pulmonary nodules. 2.  Aortic Atherosclerosis (  ICD10-I70.0). Electronically Signed   By: Abigail Miyamoto M.D.   On: 10/30/2018 08:25     ASSESSMENT: 83 y.o. [place] woman status post: 1. Right lower lobectomy April 2004 for a 1.9-cm lung adenocarcinoma, with no lymph node involvement, negative margins, and no evidence of recurrence so far. 2. Status post right mastectomy in 1998 for ductal carcinoma in situ. 3      She had a left ductectomy February 2005 for what proved to be benign changes. 4       small bilateral pulmonary nodules noted on CT scan of the chest 04/05/2016, below level of PET scan resolution by PET scan 04/24/2016   (a) repeat chest CT 10/17/2016 completely stable  (b) chest CT scan 10/24/2017 again stable (8-10 nodules per lung, all less than 5 mm, all unchanged)  (c) CT chest 10/30/2018 shows again multiple pulmonary nodules, now slightly larger   PLAN: Felicia is 15 years out from definitive surgery for her stage I lung cancer.  She is status post 21 years out from definitive surgery for her noninvasive breast cancer.  I doubt that either of of those processes are because of her lung nodules.  More likely we are dealing with pulmonary nodule secondary to her rheumatoid arthritis.  It would be optimal if we could biopsy 1 of these, but there is no easily accessible 1 and they are all quite small.  I think the risk of her dropping a longer having significant morbidity from the procedure out ways the possible benefits.  At this point she is not having any symptoms related to this except possibly the cough.  However the cough is not pleuritic, she is not producing any phlegm, and it is not constant.  I suggested she could try some Tessalon Perles but she is very reluctant to try that.  My recommendation is that we simply repeat a CT of  the chest in 6 months instead of waiting a full year.  She is agreeable to that and that accordingly will be the plan  She knows to call for any other issues that may develop before the next visit.   Waunetta Riggle, Virgie Dad, MD  11/04/18 2:30 PM Medical Oncology and Hematology [facility] [address] [place], [place] [address] Tel. [phone]    Fax. [phone]   I, Jacqualyn Posey am acting as a Education administrator for Chauncey Cruel, MD.   I, Lurline Del MD, have reviewed the above documentation for accuracy and completeness, and I agree with the above.

## 2018-11-04 ENCOUNTER — Telehealth: Payer: Self-pay | Admitting: Oncology

## 2018-11-04 ENCOUNTER — Encounter: Payer: Self-pay | Admitting: Oncology

## 2018-11-04 ENCOUNTER — Inpatient Hospital Stay (HOSPITAL_BASED_OUTPATIENT_CLINIC_OR_DEPARTMENT_OTHER): Payer: Medicare Other | Admitting: Oncology

## 2018-11-04 VITALS — BP 160/80 | HR 67 | Temp 97.4°F | Resp 18 | Wt 134.1 lb

## 2018-11-04 DIAGNOSIS — Z853 Personal history of malignant neoplasm of breast: Secondary | ICD-10-CM

## 2018-11-04 DIAGNOSIS — R911 Solitary pulmonary nodule: Secondary | ICD-10-CM

## 2018-11-04 DIAGNOSIS — Z85118 Personal history of other malignant neoplasm of bronchus and lung: Secondary | ICD-10-CM

## 2018-11-04 NOTE — Telephone Encounter (Signed)
Gave avs and calendar ° °

## 2018-11-05 ENCOUNTER — Other Ambulatory Visit: Payer: Self-pay | Admitting: *Deleted

## 2018-11-05 NOTE — Telephone Encounter (Signed)
This RN attempted to return call to pt per her VM x 3 with number busy each time.  Per VM left by pt - she states MD was to call in a prescription per visit yesterday.  Per dictation noted discussion of tessalon perles with MD stating pt reluctant to try.  Will continue to follow up.  Return call number left by pt is [phone].

## 2018-11-06 ENCOUNTER — Other Ambulatory Visit: Payer: Self-pay | Admitting: Oncology

## 2018-11-06 ENCOUNTER — Telehealth: Payer: Self-pay

## 2018-11-06 MED ORDER — BENZONATATE 100 MG PO CAPS: 100.0000 mg | ORAL_CAPSULE | Freq: Three times a day (TID) | ORAL | 0 refills | Status: DC | PRN

## 2018-11-06 NOTE — Telephone Encounter (Signed)
Patient left voicemail that she would like to proceed with medication, Tessalon Perles, via Dr. Virgie Dad recommendations from 11/04/2018.  Ok per Dr. Jana Hakim, medication sent to pharmacy.  Left voicemail for patient to return call if needed.

## 2018-11-21 DIAGNOSIS — R05 Cough: Secondary | ICD-10-CM | POA: Diagnosis not present

## 2018-11-21 DIAGNOSIS — Z6825 Body mass index (BMI) 25.0-25.9, adult: Secondary | ICD-10-CM | POA: Diagnosis not present

## 2018-11-21 DIAGNOSIS — R918 Other nonspecific abnormal finding of lung field: Secondary | ICD-10-CM | POA: Diagnosis not present

## 2018-11-21 DIAGNOSIS — M150 Primary generalized (osteo)arthritis: Secondary | ICD-10-CM | POA: Diagnosis not present

## 2018-11-21 DIAGNOSIS — E668 Other obesity: Secondary | ICD-10-CM | POA: Diagnosis not present

## 2018-11-21 DIAGNOSIS — M0589 Other rheumatoid arthritis with rheumatoid factor of multiple sites: Secondary | ICD-10-CM | POA: Diagnosis not present

## 2018-11-21 DIAGNOSIS — M1120 Other chondrocalcinosis, unspecified site: Secondary | ICD-10-CM | POA: Diagnosis not present

## 2018-11-28 DIAGNOSIS — M0589 Other rheumatoid arthritis with rheumatoid factor of multiple sites: Secondary | ICD-10-CM | POA: Diagnosis not present

## 2018-11-29 ENCOUNTER — Other Ambulatory Visit: Payer: Self-pay | Admitting: Oncology

## 2018-12-02 DIAGNOSIS — H04123 Dry eye syndrome of bilateral lacrimal glands: Secondary | ICD-10-CM | POA: Diagnosis not present

## 2018-12-02 DIAGNOSIS — H401132 Primary open-angle glaucoma, bilateral, moderate stage: Secondary | ICD-10-CM | POA: Diagnosis not present

## 2018-12-26 DIAGNOSIS — M0589 Other rheumatoid arthritis with rheumatoid factor of multiple sites: Secondary | ICD-10-CM | POA: Diagnosis not present

## 2019-01-14 ENCOUNTER — Telehealth: Payer: Self-pay

## 2019-01-14 DIAGNOSIS — R05 Cough: Secondary | ICD-10-CM | POA: Diagnosis not present

## 2019-01-14 DIAGNOSIS — R739 Hyperglycemia, unspecified: Secondary | ICD-10-CM | POA: Diagnosis not present

## 2019-01-14 DIAGNOSIS — R918 Other nonspecific abnormal finding of lung field: Secondary | ICD-10-CM | POA: Diagnosis not present

## 2019-01-14 DIAGNOSIS — I10 Essential (primary) hypertension: Secondary | ICD-10-CM | POA: Diagnosis not present

## 2019-01-14 DIAGNOSIS — F5101 Primary insomnia: Secondary | ICD-10-CM | POA: Diagnosis not present

## 2019-01-14 DIAGNOSIS — Z79899 Other long term (current) drug therapy: Secondary | ICD-10-CM | POA: Diagnosis not present

## 2019-01-14 NOTE — Telephone Encounter (Signed)
Pt LVM stating she has "new info for MD". Call returned.  VM obtained.  LVM for pt to call back.

## 2019-01-17 ENCOUNTER — Telehealth: Payer: Self-pay | Admitting: *Deleted

## 2019-01-17 NOTE — Telephone Encounter (Signed)
This RN returned VM from pt. Note pt has left prior VMs- with call returned but going to her answering machine.  Britley states since she was last seen by Dr Jannifer Rodney- discussed ongoing cough - which at that time had not changed- she began to use the tessalon perles.  " we discussed pulmonology but at that time not recommended "  " then after the New Year - the cough got worse with a lot of mucus - I mean a lot - that I am coughing so bad my ribs hurt - and can't even wear my bra "  She has seen Dr Amil Amen - who has referred her to a pulmonologist - Dr Lake Bells. She is scheduled to see him on 01/27/2019.  She also was seen recently by her primary MD- Dr Felipa Eth " because I felt so bad and thought maybe my electrolytes were off balance "  She states " only my sugar and calcium were a little elevated "  She is scheduled for follow up visits with both Dr Amil Amen and Dr Felipa Eth in the next 2 weeks.  Per call - Janiesha was concerned that Dr Jana Hakim have the above information " due to our discussion at my last visit ".  This RN informed pt above plan per her rheumatologist is appropriate due to increase in her symptoms.  Monika appreciated call and discussion " so Dr Jana Hakim knows why I am going to the pulmonologist"  No further needs at this time.  This note will be given to Dr Jannifer Rodney for review and any further recommendations.

## 2019-01-20 DIAGNOSIS — Z6824 Body mass index (BMI) 24.0-24.9, adult: Secondary | ICD-10-CM | POA: Diagnosis not present

## 2019-01-20 DIAGNOSIS — E668 Other obesity: Secondary | ICD-10-CM | POA: Diagnosis not present

## 2019-01-20 DIAGNOSIS — M0589 Other rheumatoid arthritis with rheumatoid factor of multiple sites: Secondary | ICD-10-CM | POA: Diagnosis not present

## 2019-01-20 DIAGNOSIS — R918 Other nonspecific abnormal finding of lung field: Secondary | ICD-10-CM | POA: Diagnosis not present

## 2019-01-20 DIAGNOSIS — M1120 Other chondrocalcinosis, unspecified site: Secondary | ICD-10-CM | POA: Diagnosis not present

## 2019-01-20 DIAGNOSIS — M150 Primary generalized (osteo)arthritis: Secondary | ICD-10-CM | POA: Diagnosis not present

## 2019-01-20 DIAGNOSIS — R05 Cough: Secondary | ICD-10-CM | POA: Diagnosis not present

## 2019-01-22 ENCOUNTER — Other Ambulatory Visit: Payer: Self-pay | Admitting: Obstetrics and Gynecology

## 2019-01-22 DIAGNOSIS — Z1231 Encounter for screening mammogram for malignant neoplasm of breast: Secondary | ICD-10-CM

## 2019-01-22 DIAGNOSIS — R739 Hyperglycemia, unspecified: Secondary | ICD-10-CM | POA: Diagnosis not present

## 2019-01-23 DIAGNOSIS — M0589 Other rheumatoid arthritis with rheumatoid factor of multiple sites: Secondary | ICD-10-CM | POA: Diagnosis not present

## 2019-01-23 DIAGNOSIS — Z79899 Other long term (current) drug therapy: Secondary | ICD-10-CM | POA: Diagnosis not present

## 2019-01-27 ENCOUNTER — Ambulatory Visit (INDEPENDENT_AMBULATORY_CARE_PROVIDER_SITE_OTHER): Payer: Medicare Other | Admitting: Pulmonary Disease

## 2019-01-27 ENCOUNTER — Encounter: Payer: Self-pay | Admitting: Pulmonary Disease

## 2019-01-27 VITALS — BP 151/86 | HR 78 | Ht 62.0 in | Wt 131.0 lb

## 2019-01-27 DIAGNOSIS — R911 Solitary pulmonary nodule: Secondary | ICD-10-CM

## 2019-01-27 DIAGNOSIS — Z853 Personal history of malignant neoplasm of breast: Secondary | ICD-10-CM

## 2019-01-27 DIAGNOSIS — C3491 Malignant neoplasm of unspecified part of right bronchus or lung: Secondary | ICD-10-CM | POA: Diagnosis not present

## 2019-01-27 DIAGNOSIS — M05711 Rheumatoid arthritis with rheumatoid factor of right shoulder without organ or systems involvement: Secondary | ICD-10-CM

## 2019-01-27 NOTE — Progress Notes (Signed)
   Subjective:    Patient ID: Karen Kaiser, female    DOB: Sep 16, 1935, 84 y.o.   MRN: [id number]  HPI    Review of Systems  Constitutional: Negative for fever and unexpected weight change.  HENT: Negative for congestion, dental problem, ear pain, nosebleeds, postnasal drip, rhinorrhea, sinus pressure, sneezing, sore throat and trouble swallowing.   Eyes: Negative for redness and itching.  Respiratory: Positive for cough and shortness of breath. Negative for chest tightness and wheezing.   Cardiovascular: Negative for palpitations and leg swelling.  Gastrointestinal: Negative for nausea and vomiting.  Genitourinary: Negative for dysuria.  Musculoskeletal: Negative for joint swelling.  Skin: Negative for rash.  Allergic/Immunologic: Negative.  Negative for environmental allergies, food allergies and immunocompromised state.  Neurological: Negative for headaches.  Hematological: Does not bruise/bleed easily.  Psychiatric/Behavioral: Negative for dysphoric mood. The patient is not nervous/anxious.        Objective:   Physical Exam        Assessment & Plan:

## 2019-01-27 NOTE — Patient Instructions (Signed)
Multiple scattered pulmonary nodules with increasing cough and mucus production, immunocompromise state: Repeat CT chest now Plan for endobronchial ultrasound needle aspiration of lymph nodes this week, bronchoscopy with BAL, consideration of transbronchial biopsy depending on results of repeat CT scan.  During that time we will send samples for culture, routine histologic analysis.  I will reach out to your oncology, rheumatology, and internal medicine team.  Follow-up in 3 to 4 weeks, though I will see you later this week for the bronchoscopy.

## 2019-01-27 NOTE — Progress Notes (Signed)
Synopsis: Referred in march 2020 for pulmonary nodules.  She has a history of stage 1A lung adenocarcinoma treated with lobectomy in 2004 as well as breast cancer treated with surgery in 1998.  She was diagonsed with rheumatoid arthritis in 2012.  Her treatment was complicated by pneumonia while treated with "symphony orea?" She never smoked cigarettes.  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    Subjective:   PATIENT ID: Karen Kaiser GENDER: female DOB: [birth year], MRN: [id number]   HPI  Chief Complaint  Patient presents with  . Pulm Consult    Referred by Dr. Amil Amen for pulmonary nodules. First discovered back in 2014. Increased SOB started back in February 2020.      She has a complex medical history as detailed above.  She says that she has lung cancer and breast cancer both treated surgically 16 and 22 years ago respectively wihtout evidence of occurrence.  She has been diagnosed with rheumatoid arthritis as well and has been treated with Orencia (started 2018) and methotrexate (started 2013).  She says that she has been followed for pulmonary nodules for years as well.  In 10/2018 she was seen by her oncologist and around that time she stopped taking methotrexate.  She says that since January she feels "horrible" and has been coughing a lot, she has been producing high volumes of frothy sputum.  She says that the dry cough started in December, not January and she was prescribed tessalon perles. No associated fever, body aches.  She says that she has felt quite fatigued and weak since December and she was prescribed tessalon.  She has seen her PCP and blood work was ordered.  She says that her rheumatoid arthritis is well controlled right now.  In the last week she has been producing mucus again.  However she says that she has been producing a significant amount of mucus in the mornings, typically 2 hours worth.  Then throughout the day she has a dry  cough.  Around dinner time she has been producing mucus again, typically a dry cough most of the day.  She says that she is short of breath at night unless she sits with her head propped up.  Her head is elevated in bed, but even with this she had to move to her recliner.  She doesn't cough at night and does produce mucus at night.  No leg swelling, no weight gain.      Past Medical History:  Diagnosis Date  . Arthritis    generalized arthrtis   . Cancer [facility])    breast mastectomy - 1996 lung ca 2004- RLL  . GERD (gastroesophageal reflux disease)   . Glaucoma   . Heart murmur    as a child   . Hypertension   . Peripheral vascular disease (HCC)    varciose veins in right leg   . PONV (postoperative nausea and vomiting)   . Recurrent upper respiratory infection (URI)    sinus infection 1/13 2/13 for tx      Family History  Problem Relation Age of Onset  . Stroke Mother   . Stroke Father   . Breast cancer Sister 73     Social History   Socioeconomic History  . Marital status: Married    Spouse name: Not on file  . Number of children: Not on file  . Years of education: Not on file  . Highest education level: Not on file  Occupational History  . Not on file  Social Needs  . Financial resource strain: Not on file  . Food insecurity:    Worry: Not on file    Inability: Not on file  . Transportation needs:    Medical: Not on file    Non-medical: Not on file  Tobacco Use  . Smoking status: Never Smoker  . Smokeless tobacco: Never Used  Substance and Sexual Activity  . Alcohol use: No  . Drug use: No  . Sexual activity: Not on file  Lifestyle  . Physical activity:    Days per week: Not on file    Minutes per session: Not on file  . Stress: Not on file  Relationships  . Social connections:    Talks on phone: Not on file    Gets together: Not on file    Attends religious service: Not on file    Active member of club or organization: Not on file    Attends meetings  of clubs or organizations: Not on file    Relationship status: Not on file  . Intimate partner violence:    Fear of current or ex partner: Not on file    Emotionally abused: Not on file    Physically abused: Not on file    Forced sexual activity: Not on file  Other Topics Concern  . Not on file  Social History Narrative  . Not on file     Allergies  Allergen Reactions  . Amlodipine Besy-Benazepril Hcl Swelling    10mg  or higher?? Per patient  . Latex Other (See Comments)    Blister/itching/swelling  . Xalatan [Latanoprost] Itching     Outpatient Medications Prior to Visit  Medication Sig Dispense Refill  . amLODipine (NORVASC) 2.5 MG tablet amlodipine 2.5 mg tablet    . bisoprolol (ZEBETA) 10 MG tablet bisoprolol fumarate 10 mg tablet    . brimonidine (ALPHAGAN) 0.2 % ophthalmic solution brimonidine 0.2 % eye drops    . Cholecalciferol (VITAMIN D-1000 MAX ST) 25 MCG (1000 UT) tablet Take by mouth.    . colchicine 0.6 MG tablet colchicine 0.6 mg tablet    . folic acid (FOLVITE) 1 MG tablet Take by mouth.    . hydrochlorothiazide (MICROZIDE) 12.5 MG capsule hydrochlorothiazide 12.5 mg capsule    . olmesartan (BENICAR) 20 MG tablet olmesartan 20 mg tablet    . acetaminophen (TYLENOL) 500 MG tablet Take 1,000 mg by mouth daily. Pt takes 2 tablets twice a day    . benazepril (LOTENSIN) 20 MG tablet Take 1 tablet (20 mg total) by mouth daily.    . benzonatate (TESSALON) 100 MG capsule TAKE 1 CAPSULE(100 MG) BY MOUTH THREE TIMES DAILY AS NEEDED FOR COUGH 30 capsule 2  . bisoprolol (ZEBETA) 10 MG tablet Take 10 mg by mouth daily.    . calcium-vitamin D (OSCAL WITH D) 500-200 MG-UNIT per tablet Take 1 tablet by mouth 3 (three) times daily.     . cholecalciferol (VITAMIN D) 1000 UNITS tablet Take 1,000 Units by mouth daily. Pt takes Vit D 5 x weekly.    . colchicine 0.6 MG tablet Take 0.6 mg by mouth daily. Pt takes 1 tab every other day.    . dorzolamide-timolol (COSOPT) 22.3-6.8 MG/ML  ophthalmic solution Place 1 drop into both eyes 2 (two) times daily.    . folic acid (FOLVITE) 1 MG tablet Take 1 mg by mouth daily.     Marland Kitchen loratadine (CLARITIN) 10 MG tablet Take 10 mg  by mouth daily.    . methotrexate (RHEUMATREX) 2.5 MG tablet Take 20 mg by mouth once a week. Pt takes 8 tablets once a week.    . Multiple Vitamin (MULTIVITAMIN) tablet Take 1 tablet by mouth daily.    . ranitidine (ZANTAC) 150 MG tablet Take 150 mg by mouth 2 (two) times daily.      No facility-administered medications prior to visit.     Review of Systems  Constitutional: Positive for fever and malaise/fatigue. Negative for chills and weight loss.  HENT: Negative for congestion, nosebleeds, sinus pain and sore throat.   Eyes: Negative for photophobia, pain and discharge.  Respiratory: Positive for cough, sputum production and shortness of breath. Negative for hemoptysis and wheezing.   Cardiovascular: Negative for chest pain, palpitations, orthopnea and leg swelling.  Gastrointestinal: Negative for abdominal pain, constipation, diarrhea, nausea and vomiting.  Genitourinary: Negative for dysuria, frequency, hematuria and urgency.  Musculoskeletal: Negative for back pain, joint pain, myalgias and neck pain.  Skin: Negative for itching and rash.  Neurological: Negative for tingling, tremors, sensory change, speech change, focal weakness, seizures, weakness and headaches.  Psychiatric/Behavioral: Negative for memory loss, substance abuse and suicidal ideas. The patient is not nervous/anxious.       Objective:  Physical Exam   Vitals:   01/27/19 1500  BP: (!) 151/86  Pulse: 78  SpO2: 94%  Weight: 131 lb (59.4 kg)  Height: 5\' 2"  (1.575 m)    RA  Gen: well appearing, no acute distress HENT: NCAT, OP clear, neck supple without masses Eyes: PERRL, EOMi Lymph: no cervical lymphadenopathy PULM: CTA B CV: RRR, no mgr, no JVD GI: BS+, soft, nontender, no hsm Derm: no rash or skin breakdown MSK:  normal bulk and tone Neuro: A&Ox4, CN II-XII intact, strength 5/5 in all 4 extremities Psyche: normal mood and affect   CBC    Component Value Date/Time   WBC 8.8 10/29/2018 1147   WBC 12.2 (H) 10/17/2016 1137   WBC 11.9 (H) 03/09/2016 1756   RBC 4.06 10/29/2018 1147   HGB 13.7 10/29/2018 1147   HGB 13.4 10/17/2016 1137   HCT 41.0 10/29/2018 1147   HCT 40.4 10/17/2016 1137   PLT 229 10/29/2018 1147   PLT 358 10/17/2016 1137   MCV 101.0 (H) 10/29/2018 1147   MCV 99.5 10/17/2016 1137   MCH 33.7 10/29/2018 1147   MCHC 33.4 10/29/2018 1147   RDW 14.0 10/29/2018 1147   RDW 14.0 10/17/2016 1137   LYMPHSABS 2.0 10/29/2018 1147   LYMPHSABS 2.9 10/17/2016 1137   MONOABS 0.9 10/29/2018 1147   MONOABS 0.9 10/17/2016 1137   EOSABS 0.2 10/29/2018 1147   EOSABS 0.5 10/17/2016 1137   BASOSABS 0.0 10/29/2018 1147   BASOSABS 0.1 10/17/2016 1137     Chest imaging: 10/2018 CT chest images personally reviewed showing mulitple scattered pulmonary nodules, some enlarging to 88mm from 29mm last images, also 13mm lymph nodes noted in AP window, I think there is subcarinal adenopathy as well  PFT:  Labs: 12/2018 CBC within normal limts  Path:  Echo:  Heart Catheterization:  Records from her visit with oncology in December 2019 reviewed where it was felt that the enlarging pulmonary nodules were likely due to her rheumatoid arthritis.     Assessment & Plan:   Adenocarcinoma of right lung ([place])  Solitary pulmonary nodule - Plan: CT Chest Wo Contrast, Ambulatory referral to Pulmonology  Rheumatoid arthritis involving right shoulder with positive rheumatoid factor ([place])  History of breast  cancer  Discussion: Karen Kaiser presents with an unusual and complex constellation of symptoms which have been progressively worsening since December 2019.  Objectively she is immunocompromised, she has enlarging scattered pulmonary nodules and mediastinal lymphadenopathy (subcarinal node seen on my  review of the 10/2018 CT chest) with worsening respiratory complaints of cough, mucus production and shortness of breath.  This is associated with worsening fatigue.  The differential diagnosis is broad.  First and foremost I consider atypical infection such as fungal or mycobacterial infections, inflammatory causes such as rheumatoid arthritis are possible though typically I see that dosease progress in the setting of worsening joint involvement which she does not have.  Sarcoidosis would be a reasonable unifying diagnosis considering her clinical symptoms, the progression of the nodules, the recent hypercalcemia, and the clinical worsening after stopping methotrexate.  Finally, malignancy I suppose is possible as well given her history in the past though I think less likely.  I do not think this is a methotrexate complication as typically that occurs within the first few months of starting the medication and looks much different, typically a hypersensitivity pneumonitis type picture.  She has had a good work-up up until this point and I think she needs to have a bronchoscopy to assess further.  Plan: Multiple scattered pulmonary nodules with increasing cough and mucus production, immunocompromise state: Repeat CT chest now Plan for endobronchial ultrasound needle aspiration of lymph nodes this week, bronchoscopy with BAL, consideration of transbronchial biopsy depending on results of repeat CT scan.  During that time we will send samples for culture, routine histologic analysis.  I will reach out to your oncology, rheumatology, and internal medicine team.  Follow-up in 3 to 4 weeks, though I will see you later this week for the bronchoscopy.    Current Outpatient Medications:  .  amLODipine (NORVASC) 2.5 MG tablet, amlodipine 2.5 mg tablet, Disp: , Rfl:  .  bisoprolol (ZEBETA) 10 MG tablet, bisoprolol fumarate 10 mg tablet, Disp: , Rfl:  .  brimonidine (ALPHAGAN) 0.2 % ophthalmic solution,  brimonidine 0.2 % eye drops, Disp: , Rfl:  .  Cholecalciferol (VITAMIN D-1000 MAX ST) 25 MCG (1000 UT) tablet, Take by mouth., Disp: , Rfl:  .  colchicine 0.6 MG tablet, colchicine 0.6 mg tablet, Disp: , Rfl:  .  folic acid (FOLVITE) 1 MG tablet, Take by mouth., Disp: , Rfl:  .  hydrochlorothiazide (MICROZIDE) 12.5 MG capsule, hydrochlorothiazide 12.5 mg capsule, Disp: , Rfl:  .  olmesartan (BENICAR) 20 MG tablet, olmesartan 20 mg tablet, Disp: , Rfl:

## 2019-01-28 ENCOUNTER — Ambulatory Visit (HOSPITAL_COMMUNITY)
Admission: RE | Admit: 2019-01-28 | Discharge: 2019-01-28 | Disposition: A | Discharge status: Home / Self Care | Payer: Medicare Other | Source: Ambulatory Visit | Attending: Pulmonary Disease | Admitting: Pulmonary Disease

## 2019-01-28 ENCOUNTER — Telehealth: Payer: Self-pay | Admitting: Pulmonary Disease

## 2019-01-28 ENCOUNTER — Other Ambulatory Visit: Payer: Self-pay

## 2019-01-28 DIAGNOSIS — R918 Other nonspecific abnormal finding of lung field: Secondary | ICD-10-CM | POA: Diagnosis not present

## 2019-01-28 DIAGNOSIS — R911 Solitary pulmonary nodule: Secondary | ICD-10-CM

## 2019-01-28 NOTE — Progress Notes (Unsigned)
Ct chest

## 2019-01-28 NOTE — Telephone Encounter (Signed)
Called and spoke with pt who stated she has been having issues of nausea which she stated to BQ at [facility] yesterday, 01/27/2019 but she stated at [facility] she did not ask for anything to help with the nausea.  Pt states the nausea is becoming worse and due to that, she now is requesting something to be prescribed for the nausea. Pt has been having difficulty trying to eat due to the nausea.  Dr. Lake Bells, please advise on recs for pt. Thanks!

## 2019-01-28 NOTE — Telephone Encounter (Signed)
Pt is scheduled today, 3/10 @ 4:00 at [facility]  I confirmed the appt with pt.  Pt stated nothing further needed at this time.

## 2019-01-28 NOTE — Telephone Encounter (Signed)
Called patient, unable to reach LMTCB 

## 2019-01-28 NOTE — Telephone Encounter (Signed)
zofran 4mg  po q6h prn nausea disp 12, refill 2

## 2019-01-29 ENCOUNTER — Encounter (HOSPITAL_COMMUNITY): Payer: Self-pay

## 2019-01-29 ENCOUNTER — Telehealth: Payer: Self-pay | Admitting: Pulmonary Disease

## 2019-01-29 DIAGNOSIS — J90 Pleural effusion, not elsewhere classified: Secondary | ICD-10-CM

## 2019-01-29 NOTE — Progress Notes (Signed)
Call for orders to Dr. Anastasia Pall office, spoke with Andee Poles. Call then made to pt. & she told me that Dr. Lake Bells just got off the phone with her & she was told that she will have a procedure later today & then the surg. that was sch. for tomorrow will be done on 3/13 & "it will be something different". Call then to [facility] at [facility] & asked that they update [facility]-PAT & her chart when they have rec'd confirmation of schedule. History & review of meds. done with pt.

## 2019-01-29 NOTE — Telephone Encounter (Signed)
Beth from [facility] from [facility]    she want's to know what procedure is being done and the day.  [phone]

## 2019-01-29 NOTE — Telephone Encounter (Signed)
Called Karen Kaiser to schedule bronchoscopy for March 13 @ 12N at [facility]  US Thoracentesis ordered STAT to facilitate BQ's recommendation for procedure to be done today with lab orders placed.  Called spoke with patient to inform her of the above and that she will receive a call from [facility] tomorrow afternoon with further information on her bronch for 3.13.2020.  Will hold message in triage to ensure thoracentesis is scheduled for today as BQ requested.

## 2019-01-29 NOTE — Telephone Encounter (Signed)
LMOM TCB Patient is scheduled for thoracentesis on 3.12.2020 and bronch on 3.13.2020. Which procedure is she referring to?  The order and labs for the thoracentesis have already been ordered per our protocol

## 2019-01-29 NOTE — Telephone Encounter (Signed)
OK, thanks

## 2019-01-29 NOTE — Telephone Encounter (Signed)
EBUS has been cancelled for 01/30/19 Joellen Jersey

## 2019-01-29 NOTE — Telephone Encounter (Signed)
I called Karen Kaiser to go over the results of the CT scan of her chest which shows that her mediastinal lymphadenopathy has actually decreased but the nodules have increased in size and she now has pleural effusions.  I discussed a new diagnostic plan for her which I am going to ask our pulmonary triage team to facilitate:  -Cancel endobronchial ultrasound scheduled for March 12 -Order stat right sided ultrasound thoracentesis for the right chest: Labs include cell count with differential, culture, Gram stain, glucose, LDH, protein, cytology, fungus culture, AFB culture; I feel strongly this must be done today -Arrange a conventional bronchoscopy no fluoroscopy needed at [facility] for January 31, 2019 in the afternoon  The patient voiced understanding.  Roselie Awkward, MD [place] [facility] Pager: [phone] If no response, call [phone]

## 2019-01-29 NOTE — Telephone Encounter (Signed)
Per patient's chart, thoracentesis has been ordered for tomorrow 3.12.2020 @ 1300 Will forward to Karen Kaiser to make him aware of upcoming procedure dates/times for patient

## 2019-01-30 ENCOUNTER — Ambulatory Visit (HOSPITAL_COMMUNITY)
Admission: RE | Admit: 2019-01-30 | Discharge: 2019-01-30 | Disposition: A | Discharge status: Home / Self Care | Payer: Medicare Other | Source: Ambulatory Visit | Attending: Pulmonary Disease | Admitting: Pulmonary Disease

## 2019-01-30 ENCOUNTER — Other Ambulatory Visit: Payer: Self-pay | Admitting: Pulmonary Disease

## 2019-01-30 ENCOUNTER — Encounter (HOSPITAL_COMMUNITY): Admission: RE | Payer: Self-pay | Source: Home / Self Care

## 2019-01-30 ENCOUNTER — Ambulatory Visit (HOSPITAL_COMMUNITY): Admission: RE | Admit: 2019-01-30 | Payer: Medicare Other | Source: Home / Self Care | Admitting: Pulmonary Disease

## 2019-01-30 ENCOUNTER — Encounter (HOSPITAL_COMMUNITY): Payer: Self-pay | Admitting: Physician Assistant

## 2019-01-30 ENCOUNTER — Other Ambulatory Visit: Payer: Self-pay

## 2019-01-30 ENCOUNTER — Other Ambulatory Visit (HOSPITAL_COMMUNITY): Payer: Self-pay | Admitting: Physician Assistant

## 2019-01-30 DIAGNOSIS — J910 Malignant pleural effusion: Secondary | ICD-10-CM | POA: Diagnosis not present

## 2019-01-30 DIAGNOSIS — C384 Malignant neoplasm of pleura: Secondary | ICD-10-CM | POA: Diagnosis not present

## 2019-01-30 DIAGNOSIS — Z9889 Other specified postprocedural states: Secondary | ICD-10-CM

## 2019-01-30 DIAGNOSIS — J90 Pleural effusion, not elsewhere classified: Secondary | ICD-10-CM

## 2019-01-30 HISTORY — DX: Dyspnea, unspecified: R06.00

## 2019-01-30 HISTORY — PX: IR THORACENTESIS ASP PLEURAL SPACE W/IMG GUIDE: IMG5380

## 2019-01-30 HISTORY — DX: Pneumonia, unspecified organism: J18.9

## 2019-01-30 LAB — BODY FLUID CELL COUNT WITH DIFFERENTIAL
Eos, Fluid: 0 %
Lymphs, Fluid: 87 %
Monocyte-Macrophage-Serous Fluid: 9 % — ABNORMAL LOW (ref 50–90)
Neutrophil Count, Fluid: 4 % (ref 0–25)
WBC FLUID: 435 uL (ref 0–1000)

## 2019-01-30 LAB — PROTEIN, PLEURAL OR PERITONEAL FLUID: Total protein, fluid: 4.1 g/dL

## 2019-01-30 LAB — GLUCOSE, PLEURAL OR PERITONEAL FLUID: GLUCOSE FL: 134 mg/dL

## 2019-01-30 LAB — LACTATE DEHYDROGENASE, PLEURAL OR PERITONEAL FLUID: LD, Fluid: 118 U/L — ABNORMAL HIGH (ref 3–23)

## 2019-01-30 SURGERY — BRONCHOSCOPY, WITH EBUS
Anesthesia: General

## 2019-01-30 MED ORDER — LIDOCAINE HCL (PF) 1 % IJ SOLN
INTRAMUSCULAR | Status: AC | PRN
  Administered 2019-01-30: 10 mL

## 2019-01-30 MED ORDER — LIDOCAINE HCL 1 % IJ SOLN
INTRAMUSCULAR | Status: AC
  Filled 2019-01-30: qty 20

## 2019-01-30 NOTE — Procedures (Signed)
PROCEDURE SUMMARY:  Successful US guided right thoracentesis. Yielded 400 mL of clear yellow fluid. Patient tolerated procedure well. No immediate complications. EBL = trace  Specimen was sent for labs.  Post procedure chest X-ray reveals no pneumothorax  Gadiel John S Catheryne Deford PA-C 01/30/2019 4:11 PM

## 2019-01-30 NOTE — Telephone Encounter (Signed)
Attempted to call Karen Kaiser with Pre Admission but unable to reach her. Left message for Karen Kaiser to return call. Left a detailed message stating that the order/labs were placed for thoracentesis which is scheduled for today, 3/12 and bronch which is scheduled for 3/13. Will await a return call to see if there is anything else left that we were supposed to do.

## 2019-01-30 NOTE — Telephone Encounter (Signed)
Called and spoke with Maryclare Labrador. She stated that it looks like there is nothing else that we need to do. Will close encounter. Nothing further needed.

## 2019-01-30 NOTE — Telephone Encounter (Signed)
Attempted to call pt but unable to reach. Left message for pt to return call. 

## 2019-01-31 ENCOUNTER — Other Ambulatory Visit: Payer: Self-pay

## 2019-01-31 ENCOUNTER — Inpatient Hospital Stay (HOSPITAL_COMMUNITY)
Admission: RE | Admit: 2019-01-31 | Discharge: 2019-02-06 | DRG: 193 | Disposition: A | Discharge status: Home Health | Payer: Medicare Other | Attending: Pulmonary Disease | Admitting: Pulmonary Disease

## 2019-01-31 ENCOUNTER — Encounter (HOSPITAL_COMMUNITY): Payer: Self-pay | Admitting: General Practice

## 2019-01-31 ENCOUNTER — Encounter (HOSPITAL_COMMUNITY)
Admission: RE | Disposition: A | Discharge status: Home Health | Payer: Self-pay | Source: Home / Self Care | Attending: Pulmonary Disease

## 2019-01-31 ENCOUNTER — Ambulatory Visit (HOSPITAL_COMMUNITY)
Admission: RE | Admit: 2019-01-31 | Discharge: 2019-01-31 | Disposition: A | Discharge status: Home / Self Care | Payer: Medicare Other | Source: Ambulatory Visit | Attending: Pulmonary Disease | Admitting: Pulmonary Disease

## 2019-01-31 DIAGNOSIS — J309 Allergic rhinitis, unspecified: Secondary | ICD-10-CM | POA: Diagnosis present

## 2019-01-31 DIAGNOSIS — M109 Gout, unspecified: Secondary | ICD-10-CM | POA: Diagnosis not present

## 2019-01-31 DIAGNOSIS — R069 Unspecified abnormalities of breathing: Secondary | ICD-10-CM

## 2019-01-31 DIAGNOSIS — Z85118 Personal history of other malignant neoplasm of bronchus and lung: Secondary | ICD-10-CM | POA: Diagnosis not present

## 2019-01-31 DIAGNOSIS — Z853 Personal history of malignant neoplasm of breast: Secondary | ICD-10-CM

## 2019-01-31 DIAGNOSIS — H409 Unspecified glaucoma: Secondary | ICD-10-CM | POA: Diagnosis not present

## 2019-01-31 DIAGNOSIS — J90 Pleural effusion, not elsewhere classified: Secondary | ICD-10-CM

## 2019-01-31 DIAGNOSIS — J181 Lobar pneumonia, unspecified organism: Secondary | ICD-10-CM | POA: Diagnosis not present

## 2019-01-31 DIAGNOSIS — R0602 Shortness of breath: Secondary | ICD-10-CM | POA: Diagnosis not present

## 2019-01-31 DIAGNOSIS — Z9011 Acquired absence of right breast and nipple: Secondary | ICD-10-CM

## 2019-01-31 DIAGNOSIS — J9601 Acute respiratory failure with hypoxia: Secondary | ICD-10-CM | POA: Diagnosis present

## 2019-01-31 DIAGNOSIS — J189 Pneumonia, unspecified organism: Principal | ICD-10-CM | POA: Diagnosis present

## 2019-01-31 DIAGNOSIS — C799 Secondary malignant neoplasm of unspecified site: Secondary | ICD-10-CM | POA: Diagnosis not present

## 2019-01-31 DIAGNOSIS — I739 Peripheral vascular disease, unspecified: Secondary | ICD-10-CM | POA: Diagnosis not present

## 2019-01-31 DIAGNOSIS — R011 Cardiac murmur, unspecified: Secondary | ICD-10-CM | POA: Diagnosis present

## 2019-01-31 DIAGNOSIS — K219 Gastro-esophageal reflux disease without esophagitis: Secondary | ICD-10-CM | POA: Diagnosis present

## 2019-01-31 DIAGNOSIS — R05 Cough: Secondary | ICD-10-CM | POA: Diagnosis not present

## 2019-01-31 DIAGNOSIS — J440 Chronic obstructive pulmonary disease with acute lower respiratory infection: Secondary | ICD-10-CM | POA: Diagnosis present

## 2019-01-31 DIAGNOSIS — Z79899 Other long term (current) drug therapy: Secondary | ICD-10-CM | POA: Diagnosis not present

## 2019-01-31 DIAGNOSIS — Z91040 Latex allergy status: Secondary | ICD-10-CM | POA: Diagnosis not present

## 2019-01-31 DIAGNOSIS — C3490 Malignant neoplasm of unspecified part of unspecified bronchus or lung: Secondary | ICD-10-CM | POA: Diagnosis present

## 2019-01-31 DIAGNOSIS — R918 Other nonspecific abnormal finding of lung field: Secondary | ICD-10-CM | POA: Diagnosis not present

## 2019-01-31 DIAGNOSIS — C801 Malignant (primary) neoplasm, unspecified: Secondary | ICD-10-CM | POA: Diagnosis not present

## 2019-01-31 DIAGNOSIS — J910 Malignant pleural effusion: Secondary | ICD-10-CM | POA: Diagnosis present

## 2019-01-31 DIAGNOSIS — Z96651 Presence of right artificial knee joint: Secondary | ICD-10-CM | POA: Diagnosis present

## 2019-01-31 DIAGNOSIS — Z823 Family history of stroke: Secondary | ICD-10-CM | POA: Diagnosis not present

## 2019-01-31 DIAGNOSIS — M069 Rheumatoid arthritis, unspecified: Secondary | ICD-10-CM | POA: Diagnosis present

## 2019-01-31 DIAGNOSIS — Z888 Allergy status to other drugs, medicaments and biological substances status: Secondary | ICD-10-CM

## 2019-01-31 DIAGNOSIS — I10 Essential (primary) hypertension: Secondary | ICD-10-CM | POA: Diagnosis present

## 2019-01-31 DIAGNOSIS — C3431 Malignant neoplasm of lower lobe, right bronchus or lung: Secondary | ICD-10-CM | POA: Diagnosis not present

## 2019-01-31 HISTORY — DX: Family history of other specified conditions: Z84.89

## 2019-01-31 HISTORY — DX: Malignant neoplasm of unspecified part of unspecified bronchus or lung: C34.90

## 2019-01-31 LAB — CBC WITH DIFFERENTIAL/PLATELET
Abs Immature Granulocytes: 0.04 10*3/uL (ref 0.00–0.07)
Basophils Absolute: 0 10*3/uL (ref 0.0–0.1)
Basophils Relative: 0 %
Eosinophils Absolute: 0 10*3/uL (ref 0.0–0.5)
Eosinophils Relative: 0 %
HEMATOCRIT: 46.3 % — AB (ref 36.0–46.0)
HEMOGLOBIN: 16.3 g/dL — AB (ref 12.0–15.0)
Immature Granulocytes: 0 %
LYMPHS PCT: 16 %
Lymphs Abs: 1.8 10*3/uL (ref 0.7–4.0)
MCH: 33.5 pg (ref 26.0–34.0)
MCHC: 35.2 g/dL (ref 30.0–36.0)
MCV: 95.1 fL (ref 80.0–100.0)
Monocytes Absolute: 0.6 10*3/uL (ref 0.1–1.0)
Monocytes Relative: 5 %
Neutro Abs: 8.9 10*3/uL — ABNORMAL HIGH (ref 1.7–7.7)
Neutrophils Relative %: 79 %
Platelets: 244 10*3/uL (ref 150–400)
RBC: 4.87 MIL/uL (ref 3.87–5.11)
RDW: 12.5 % (ref 11.5–15.5)
WBC: 11.3 10*3/uL — ABNORMAL HIGH (ref 4.0–10.5)
nRBC: 0 % (ref 0.0–0.2)

## 2019-01-31 LAB — COMPREHENSIVE METABOLIC PANEL
ALT: 21 U/L (ref 0–44)
AST: 24 U/L (ref 15–41)
Albumin: 3.7 g/dL (ref 3.5–5.0)
Alkaline Phosphatase: 71 U/L (ref 38–126)
Anion gap: 12 (ref 5–15)
BUN: 10 mg/dL (ref 8–23)
CO2: 26 mmol/L (ref 22–32)
Calcium: 9.4 mg/dL (ref 8.9–10.3)
Chloride: 96 mmol/L — ABNORMAL LOW (ref 98–111)
Creatinine, Ser: 0.65 mg/dL (ref 0.44–1.00)
Glucose, Bld: 116 mg/dL — ABNORMAL HIGH (ref 70–99)
Potassium: 3.2 mmol/L — ABNORMAL LOW (ref 3.5–5.1)
Sodium: 134 mmol/L — ABNORMAL LOW (ref 135–145)
TOTAL PROTEIN: 6.6 g/dL (ref 6.5–8.1)
Total Bilirubin: 1.5 mg/dL — ABNORMAL HIGH (ref 0.3–1.2)

## 2019-01-31 LAB — ACID FAST SMEAR (AFB, MYCOBACTERIA): Acid Fast Smear: NEGATIVE

## 2019-01-31 LAB — MAGNESIUM: Magnesium: 2 mg/dL (ref 1.7–2.4)

## 2019-01-31 LAB — GRAM STAIN: Gram Stain: NONE SEEN

## 2019-01-31 LAB — PHOSPHORUS: PHOSPHORUS: 3.3 mg/dL (ref 2.5–4.6)

## 2019-01-31 LAB — BRAIN NATRIURETIC PEPTIDE: B Natriuretic Peptide: 119.3 pg/mL — ABNORMAL HIGH (ref 0.0–100.0)

## 2019-01-31 LAB — PROCALCITONIN: Procalcitonin: <0.1 ng/mL

## 2019-01-31 SURGERY — CANCELLED PROCEDURE
Laterality: Bilateral

## 2019-01-31 MED ORDER — LORATADINE 10 MG PO TABS
10.0000 mg | ORAL_TABLET | Freq: Every day | ORAL | Status: DC
  Administered 2019-02-02 – 2019-02-06 (×4): 10 mg via ORAL
  Filled 2019-01-31 (×7): qty 1

## 2019-01-31 MED ORDER — ENOXAPARIN SODIUM 40 MG/0.4ML ~~LOC~~ SOLN
40.0000 mg | SUBCUTANEOUS | Status: DC
  Administered 2019-01-31 – 2019-02-02 (×3): 40 mg via SUBCUTANEOUS
  Filled 2019-01-31 (×3): qty 0.4

## 2019-01-31 MED ORDER — BRIMONIDINE TARTRATE 0.15 % OP SOLN
1.0000 [drp] | Freq: Two times a day (BID) | OPHTHALMIC | Status: DC
  Administered 2019-01-31 – 2019-02-06 (×12): 1 [drp] via OPHTHALMIC
  Filled 2019-01-31: qty 5

## 2019-01-31 MED ORDER — VITAMIN D3 25 MCG (1000 UNIT) PO TABS
1000.0000 [IU] | ORAL_TABLET | ORAL | Status: DC
  Administered 2019-01-31 – 2019-02-05 (×4): 1000 [IU] via ORAL
  Filled 2019-01-31 (×11): qty 1

## 2019-01-31 MED ORDER — KCL IN DEXTROSE-NACL 20-5-0.45 MEQ/L-%-% IV SOLN
INTRAVENOUS | Status: DC
  Administered 2019-01-31 – 2019-02-02 (×3): via INTRAVENOUS
  Filled 2019-01-31: qty 2000
  Filled 2019-01-31 (×2): qty 1000

## 2019-01-31 MED ORDER — BISOPROLOL FUMARATE 5 MG PO TABS
10.0000 mg | ORAL_TABLET | Freq: Every day | ORAL | Status: DC
  Administered 2019-02-01 – 2019-02-06 (×6): 10 mg via ORAL
  Filled 2019-01-31 (×6): qty 2

## 2019-01-31 MED ORDER — SODIUM CHLORIDE 0.9 % IV SOLN
2.0000 g | INTRAVENOUS | Status: DC
  Administered 2019-01-31 – 2019-02-03 (×4): 2 g via INTRAVENOUS
  Filled 2019-01-31 (×4): qty 20

## 2019-01-31 MED ORDER — ACETAMINOPHEN 325 MG PO TABS
650.0000 mg | ORAL_TABLET | ORAL | Status: DC | PRN
  Administered 2019-01-31: 650 mg via ORAL
  Filled 2019-01-31 (×2): qty 2

## 2019-01-31 MED ORDER — ONDANSETRON HCL 4 MG/2ML IJ SOLN
4.0000 mg | Freq: Four times a day (QID) | INTRAMUSCULAR | Status: DC | PRN
  Administered 2019-01-31 – 2019-02-06 (×5): 4 mg via INTRAVENOUS
  Filled 2019-01-31 (×5): qty 2

## 2019-01-31 MED ORDER — HYDROCHLOROTHIAZIDE 12.5 MG PO CAPS
12.5000 mg | ORAL_CAPSULE | Freq: Every day | ORAL | Status: DC
  Administered 2019-02-01 – 2019-02-06 (×6): 12.5 mg via ORAL
  Filled 2019-01-31 (×7): qty 1

## 2019-01-31 MED ORDER — CALCIUM CARBONATE-VITAMIN D 500-200 MG-UNIT PO TABS
1.0000 | ORAL_TABLET | Freq: Three times a day (TID) | ORAL | Status: DC
  Administered 2019-01-31 – 2019-02-06 (×15): 1 via ORAL
  Filled 2019-01-31 (×18): qty 1

## 2019-01-31 MED ORDER — AMLODIPINE BESYLATE 2.5 MG PO TABS
2.5000 mg | ORAL_TABLET | Freq: Every evening | ORAL | Status: DC
  Administered 2019-01-31 – 2019-02-05 (×6): 2.5 mg via ORAL
  Filled 2019-01-31 (×6): qty 1

## 2019-01-31 MED ORDER — ADULT MULTIVITAMIN W/MINERALS CH
1.0000 | ORAL_TABLET | Freq: Every day | ORAL | Status: DC
  Administered 2019-01-31 – 2019-02-06 (×7): 1 via ORAL
  Filled 2019-01-31 (×7): qty 1

## 2019-01-31 MED ORDER — COLCHICINE 0.6 MG PO TABS
0.6000 mg | ORAL_TABLET | ORAL | Status: DC
  Administered 2019-01-31 – 2019-02-06 (×4): 0.6 mg via ORAL
  Filled 2019-01-31 (×5): qty 1

## 2019-01-31 MED ORDER — IRBESARTAN 75 MG PO TABS
37.5000 mg | ORAL_TABLET | Freq: Every day | ORAL | Status: DC
  Administered 2019-02-01 – 2019-02-06 (×6): 37.5 mg via ORAL
  Filled 2019-01-31 (×7): qty 1

## 2019-01-31 MED ORDER — SODIUM CHLORIDE 0.9 % IV SOLN
500.0000 mg | INTRAVENOUS | Status: DC
  Administered 2019-01-31 – 2019-02-02 (×3): 500 mg via INTRAVENOUS
  Filled 2019-01-31 (×3): qty 500

## 2019-01-31 MED ORDER — DORZOLAMIDE HCL-TIMOLOL MAL 2-0.5 % OP SOLN
1.0000 [drp] | Freq: Two times a day (BID) | OPHTHALMIC | Status: DC
  Administered 2019-01-31 – 2019-02-06 (×12): 1 [drp] via OPHTHALMIC
  Filled 2019-01-31: qty 10

## 2019-01-31 MED ORDER — FAMOTIDINE 20 MG PO TABS
20.0000 mg | ORAL_TABLET | Freq: Every day | ORAL | Status: DC
  Administered 2019-01-31 – 2019-02-05 (×6): 20 mg via ORAL
  Filled 2019-01-31 (×6): qty 1

## 2019-01-31 NOTE — Telephone Encounter (Signed)
Left message for patient to call back  

## 2019-01-31 NOTE — H&P (Signed)
NAME:  Karen Kaiser, MRN:  [id number], DOB:  [birth year], LOS: 0 ADMISSION DATE:  01/31/2019, CONSULTATION DATE: January 31, 2019 REFERRING MD: Lake Bells, CHIEF COMPLAINT: Dyspnea  Brief History   84 year old female with a past medical history significant for lung adenocarcinoma treated with surgery in 2004, breast cancer was admitted on January 31, 2019 in the setting of a new pleural effusion, increasing shortness of breath cough and nausea.  History of present illness   84 year old female with a past medical history significant for stage Ia lung adenocarcinoma, breast cancer and rheumatoid arthritis first presented to the [facility] pulmonary clinic on January 27, 2019 for evaluation of increasing shortness of breath, generalized fatigue, weakness and nausea.  She was noted to have multiple pulmonary nodules, some mediastinal lymphadenopathy.  She says that over the several weeks after the CT scan was performed that showed these abnormalities she developed worsening fatigue, nausea, and shortness of breath.  A repeat CT scan was performed which showed new bilateral pleural effusions right greater than left.  A thoracentesis was performed on January 30, 2019.  However, despite this her shortness of breath worsens.  She presented for an elective bronchoscopy on January 31, 2019 but because of shortness of breath it was felt that this was not safe.  Further, final results from the thoracentesis were pending.  She notes poor p.o. intake, significant nausea and vomiting at home.  She has a lot of weakness.  Past Medical History  Rheumatoid arthritis Peripheral vascular disease Breast cancer Lung cancer Gastroesophageal reflux disease Hypertension  Significant Hospital Events     Consults:    Procedures:  January 30, 2019 right-sided thoracentesis showed a lymphocytic predominant effusion, cytology pending  Significant Diagnostic Tests:  January 29, 2019 CT chest showed new right greater than left pleural  effusion, increasing nodules bilaterally worrisome for metastatic disease, mediastinal and hilar adenopathy stable if not decreased  Micro Data:  3/13 sputum culture >   Antimicrobials:  3/13 ceftriaxone >  3/13 azithro >    Interim history/subjective:  As above  Objective   There were no vitals taken for this visit.       No intake or output data in the 24 hours ending 01/31/19 1125 There were no vitals filed for this visit.  Examination:  General:  Resting in bed, mild respiratory distress, no accessory muscle use, speaking in full sentences HENT: NCAT OP clear PULM: CTA B anteriorly, diminished bases CV: RRR, no mgr GI: BS+, soft, nontender MSK: normal bulk and tone Neuro: awake, alert, [facility] Problem list     Assessment & Plan:  84 year old female with worsening shortness of breath, nausea, fatigue poor p.o. intake presented for an elective bronchoscopy today but notes worsening symptoms.  The differential diagnosis is broad but I am concerned her symptoms are primarily due to a currently undiagnosed malignancy, possibly recurrent lung cancer.  Differential diagnosis includes community-acquired pneumonia but she lacks the fever or chills to strongly suggest this.  I think it is best to cover it for now.  Increasing pulmonary nodules with new pleural effusions: Follow-up cytology from January 30, 2019 Consider elective bronchoscopy on February 03, 2019 Repeat chest x-ray now  Community-acquired pneumonia: Sputum culture Ceftriaxone Azithromycin IV fluids Monitor O2 saturation Check BMET, CBC  Poor p.o. intake, nausea and vomiting: PRN Zofran Regular diet Gentle IV fluids  GERD: Continue home dose of famotidine  Rheumatoid arthritis: Hold home dose of Arin Sia  Allergic rhinitis: Continue Claritin  Hypertension: Continue home dose of bisoprolol and amlodipine and hctz and ARB (change for formulary version) Check BMET  Gout  Continue  home dose of colchicine every other day  Best practice:  Diet: regular diet Pain/Anxiety/Delirium protocol (if indicated): n/a VAP protocol (if indicated): n/a DVT prophylaxis: lovenox GI prophylaxis: famotidine Glucose control: monitor Mobility: up ad lib Code Status: full Family Communication: updated in bronchosocpy suite Disposition: med surg  Labs   CBC: No results for input(s): WBC, NEUTROABS, HGB, HCT, MCV, PLT in the last 168 hours.  Basic Metabolic Panel: No results for input(s): NA, K, CL, CO2, GLUCOSE, BUN, CREATININE, CALCIUM, MG, PHOS in the last 168 hours. GFR: CrCl cannot be calculated (Patient's most recent lab result is older than the maximum 21 days allowed.). No results for input(s): PROCALCITON, WBC, LATICACIDVEN in the last 168 hours.  Liver Function Tests: No results for input(s): AST, ALT, ALKPHOS, BILITOT, PROT, ALBUMIN in the last 168 hours. No results for input(s): LIPASE, AMYLASE in the last 168 hours. No results for input(s): AMMONIA in the last 168 hours.  ABG No results found for: PHART, PCO2ART, PO2ART, HCO3, TCO2, ACIDBASEDEF, O2SAT   Coagulation Profile: No results for input(s): INR, PROTIME in the last 168 hours.  Cardiac Enzymes: No results for input(s): CKTOTAL, CKMB, CKMBINDEX, TROPONINI in the last 168 hours.  HbA1C: No results found for: HGBA1C  CBG: No results for input(s): GLUCAP in the last 168 hours.  Review of Systems:   Gen: Denies fever, chills, weight change, fatigue, night sweats HEENT: Denies blurred vision, double vision, hearing loss, tinnitus, sinus congestion, rhinorrhea, sore throat, neck stiffness, dysphagia PULM:per HPI CV: Denies chest pain, edema, orthopnea, paroxysmal nocturnal dyspnea, palpitations GI: Denies abdominal pain, nausea, vomiting, diarrhea, hematochezia, melena, constipation, change in bowel habits GU: Denies dysuria, hematuria, polyuria, oliguria, urethral discharge Endocrine: Denies hot or  cold intolerance, polyuria, polyphagia or appetite change Derm: Denies rash, dry skin, scaling or peeling skin change Heme: Denies easy bruising, bleeding, bleeding gums Neuro: Denies headache, numbness, weakness, slurred speech, loss of memory or consciousness   Past Medical History  She,  has a past medical history of Arthritis, Cancer ([place]), Dyspnea, GERD (gastroesophageal reflux disease), Glaucoma, Heart murmur, Hypertension, Peripheral vascular disease ([place]), Pneumonia, PONV (postoperative nausea and vomiting), and Recurrent upper respiratory infection (URI).   Surgical History    Past Surgical History:  Procedure Laterality Date   BREAST BIOPSY     left breast biopsy 2006    cataract surgery     DILATION AND CURETTAGE OF UTERUS     EYE SURGERY     bilateral cataract srugery    IR THORACENTESIS ASP PLEURAL SPACE W/IMG GUIDE  01/30/2019   LOBECTOMY     RLL    MASTECTOMY     right    OTHER SURGICAL HISTORY     right rotator cuff surgery    ROTATOR CUFF REPAIR Right    TOTAL KNEE ARTHROPLASTY  02/12/2012   Procedure: TOTAL KNEE ARTHROPLASTY;  Surgeon: Gearlean Alf, MD;  Location: [facility];  Service: Orthopedics;  Laterality: Right;     Social History   reports that she has never smoked. She has never used smokeless tobacco. She reports that she does not drink alcohol or use drugs.   Family History   Her family history includes Breast cancer (age of onset: 55) in her sister; Stroke in her father and mother.   Allergies Allergies  Allergen Reactions   Amlodipine Besy-Benazepril Hcl Swelling  10mg  or higher?? Per patient   Latex Other (See Comments)    Blister/itching/swelling   Rhopressa [Netarsudil Dimesylate] Other (See Comments)    Eye redness   Simponi Aria [Golimumab]     Pt. Developed pneumonia with the second dose   Xalatan [Latanoprost] Itching   Plaquenil [Hydroxychloroquine Sulfate] Itching and Rash     Home Medications  Prior to  Admission medications   Medication Sig Start Date End Date Taking? Authorizing Provider  Abatacept (ORENCIA IV) Inject 1 Dose into the vein every 28 (twenty-eight) days.     [provider]  acetaminophen (TYLENOL) 500 MG tablet Take 1,000 mg by mouth 2 (two) times daily.    [provider]  amLODipine (NORVASC) 2.5 MG tablet Take 2.5 mg by mouth every evening.     [provider]  bisoprolol (ZEBETA) 10 MG tablet Take 10 mg by mouth daily before breakfast.     [provider]  brimonidine (ALPHAGAN P) 0.1 % SOLN Place 1 drop into both eyes 2 (two) times daily.    [provider]  Calcium Carb-Cholecalciferol (CALCIUM + D3 PO) Take 1 tablet by mouth 3 (three) times daily.    [provider]  cholecalciferol (VITAMIN D) 25 MCG (1000 UT) tablet Take 1,000 Units by mouth every Monday, Tuesday, Wednesday, Thursday, and Friday.    [provider]  colchicine 0.6 MG tablet Take 0.6 mg by mouth every other day.     [provider]  dorzolamide-timolol (COSOPT) 22.3-6.8 MG/ML ophthalmic solution Place 1 drop into both eyes 2 (two) times daily.    [provider]  famotidine (PEPCID) 20 MG tablet Take 20 mg by mouth at bedtime.  11/27/18   [provider]  hydrochlorothiazide (MICROZIDE) 12.5 MG capsule Take 12.5 mg by mouth daily.     [provider]  loratadine (CLARITIN) 10 MG tablet Take 10 mg by mouth daily.    [provider]  Multiple Vitamin (MULTIVITAMIN WITH MINERALS) TABS tablet Take 1 tablet by mouth daily with lunch. Centrum Silver    [provider]  olmesartan (BENICAR) 20 MG tablet Take 20 mg by mouth daily.     [provider]     Roselie Awkward, MD [facility] Pager: [phone] Cell: [phone] If no response, call [phone]

## 2019-01-31 NOTE — Progress Notes (Signed)
Patient here for bronchoscopy.  Upon patient arrival, Dr. Lake Bells requested to speak with the patient prior to prepping for procedure.  Upon his arrival, he spoke with that patient regarding the procedure.  He felt it would be best to hold off on the procedure at this time, and for the patient to be admitted to the hospital.  Patient agreed with this plan.  Patient then transferred to 4V40 via stretcher, and bedside report given to receiving RN.  Patient tolerated transfer well.  Family with patient as well.

## 2019-02-01 LAB — CBC
HCT: 42.6 % (ref 36.0–46.0)
Hemoglobin: 14.4 g/dL (ref 12.0–15.0)
MCH: 32.4 pg (ref 26.0–34.0)
MCHC: 33.8 g/dL (ref 30.0–36.0)
MCV: 95.9 fL (ref 80.0–100.0)
Platelets: 217 10*3/uL (ref 150–400)
RBC: 4.44 MIL/uL (ref 3.87–5.11)
RDW: 12.4 % (ref 11.5–15.5)
WBC: 9.7 10*3/uL (ref 4.0–10.5)
nRBC: 0 % (ref 0.0–0.2)

## 2019-02-01 LAB — BASIC METABOLIC PANEL
Anion gap: 9 (ref 5–15)
BUN: 6 mg/dL — AB (ref 8–23)
CO2: 25 mmol/L (ref 22–32)
Calcium: 8.6 mg/dL — ABNORMAL LOW (ref 8.9–10.3)
Chloride: 99 mmol/L (ref 98–111)
Creatinine, Ser: 0.59 mg/dL (ref 0.44–1.00)
GFR calc Af Amer: >60 mL/min (ref >60)
GFR calc non Af Amer: >60 mL/min (ref >60)
Glucose, Bld: 108 mg/dL — ABNORMAL HIGH (ref 70–99)
Potassium: 3.3 mmol/L — ABNORMAL LOW (ref 3.5–5.1)
Sodium: 133 mmol/L — ABNORMAL LOW (ref 135–145)

## 2019-02-01 NOTE — Progress Notes (Signed)
NAME:  Karen Kaiser, MRN:  [id number], DOB:  01/30/1935, LOS: 1 ADMISSION DATE:  01/31/2019, CONSULTATION DATE: January 31, 2019 REFERRING MD: Lake Bells, CHIEF COMPLAINT: Dyspnea  Brief History    84 year old female with a past medical history significant for stage Ia lung adenocarcinoma, breast cancer and rheumatoid arthritis first presented to the [facility] pulmonary clinic on January 27, 2019 for evaluation of increasing shortness of breath, generalized fatigue, weakness and nausea.  She was noted to have multiple pulmonary nodules, some mediastinal lymphadenopathy.  She says that over the several weeks after the CT scan was performed that showed these abnormalities she developed worsening fatigue, nausea, and shortness of breath.  A repeat CT scan was performed which showed new bilateral pleural effusions right greater than left.  A thoracentesis was performed on January 30, 2019.  However, despite this her shortness of breath worsens.  She presented for an elective bronchoscopy on January 31, 2019 but because of shortness of breath it was felt that this was not safe.  Further, final results from the thoracentesis were pending.  She notes poor p.o. intake, significant nausea and vomiting at home.  She has a lot of weakness.  Past Medical History  Rheumatoid arthritis Peripheral vascular disease Breast cancer Lung cancer Gastroesophageal reflux disease Hypertension  Significant Hospital Events     Consults:    Procedures:  January 30, 2019 right-sided thoracentesis showed a lymphocytic predominant effusion, cytology pending  Significant Diagnostic Tests:  January 29, 2019 CT chest showed new right greater than left pleural effusion, increasing nodules bilaterally worrisome for metastatic disease, mediastinal and hilar adenopathy stable if not decreased  Micro Data:  3/13 sputum culture >   Antimicrobials:  3/13 ceftriaxone >  3/13 azithro >    Interim history/subjective:    02/01/2019  -  on o2, cytology still pending. She says she has aged 54 year in 3 months. Daughters at bedside. Want her in hospital till all procecures done. Desatruated walking on room air. Says doctors baffled at her worsening  Objective   Blood pressure (!) 153/80, pulse 75, temperature 98.1 F (36.7 C), temperature source Oral, resp. rate 16, height 5\' 1"  (1.549 m), weight 61 kg, SpO2 100 %.        Intake/Output Summary (Last 24 hours) at 02/01/2019 1506 Last data filed at 02/01/2019 1300 Gross per 24 hour  Intake 2693.23 ml  Output 1600 ml  Net 1093.23 ml   Filed Weights   01/31/19 1800 02/01/19 0155  Weight: 61.2 kg 61 kg    Examination: General Appearance:  Looks frail, deconditioned Head:  Normocephalic, without obvious abnormality, atraumatic Eyes:  PERRL - yes, conjunctiva/corneas - clear     Ears:  Normal external ear canals, both ears Nose:  G tube - no Throat:  ETT TUBE - no , OG tube - n Neck:  Supple,  No enlargement/tenderness/nodules Lungs: Clear to auscultation bilaterally,  Heart:  S1 and S2 normal, no murmur, CVP - no.  Pressors - no Abdomen:  Soft, no masses, no organomegaly Genitalia / Rectal:  Not done Extremities:  Extremities- intact Skin:  ntact in exposed areas . Sacral area  - x Neurologic:  Sedation - none -> RASS - +1 . Moves all 4s - yes. CAM-ICU - neg . Orientation - x3+      LABS    PULMONARY No results for input(s): PHART, PCO2ART, PO2ART, HCO3, TCO2, O2SAT in the last 168 hours.  Invalid input(s): PCO2, PO2  CBC Recent Labs  Lab 01/31/19 1215 02/01/19 0215  HGB 16.3* 14.4  HCT 46.3* 42.6  WBC 11.3* 9.7  PLT 244 217    COAGULATION No results for input(s): INR in the last 168 hours.  CARDIAC  No results for input(s): TROPONINI in the last 168 hours. No results for input(s): PROBNP in the last 168 hours.   CHEMISTRY Recent Labs  Lab 01/31/19 1215 02/01/19 0215  NA 134* 133*  K 3.2* 3.3*  CL 96* 99  CO2 26 25  GLUCOSE 116* 108*   BUN 10 6*  CREATININE 0.65 0.59  CALCIUM 9.4 8.6*  MG 2.0  --   PHOS 3.3  --    Estimated Creatinine Clearance: 46.2 mL/min (by C-G formula based on SCr of 0.59 mg/dL).   LIVER Recent Labs  Lab 01/31/19 1215  AST 24  ALT 21  ALKPHOS 71  BILITOT 1.5*  PROT 6.6  ALBUMIN 3.7     INFECTIOUS Recent Labs  Lab 01/31/19 1215  PROCALCITON <0.10     ENDOCRINE CBG (last 3)  No results for input(s): GLUCAP in the last 72 hours.       IMAGING x48h  - image(s) personally visualized  -   highlighted in bold No results found.   Resolved Hospital Problem list     Assessment & Plan:  84 year old female with worsening shortness of breath, nausea, fatigue poor p.o. intake presented for an elective bronchoscopy today but notes worsening symptoms.  The differential diagnosis is broad but I am concerned her symptoms are primarily due to a currently undiagnosed malignancy, possibly recurrent lung cancer.  Differential diagnosis includes community-acquired pneumonia but she lacks the fever or chills to strongly suggest this.  I think it is best to cover it for now.  Increasing pulmonary nodules with new pleural effusions:  3/14 - unchanged statusl; result stilll pending  PLAN Follow-up cytology from January 30, 2019 Consider elective bronchoscopy on February 03, 2019   Community-acquired pneumonia:  Ceftriaxone Azithromycin IV fluids Monitor O2 saturation   Poor p.o. intake, nausea and vomiting: PRN Zofran Regular diet Gentle IV fluids  GERD: Continue home dose of famotidine  Rheumatoid arthritis: Hold home dose of Orencia  Allergic rhinitis: Continue Claritin  Hypertension: Continue home dose of bisoprolol and amlodipine and hctz and ARB (change for formulary version) Check BMET  Gout  Continue home dose of colchicine every other day  Best practice:  Diet: regular diet Pain/Anxiety/Delirium protocol (if indicated): n/a VAP protocol (if indicated): n/a  DVT prophylaxis: lovenox GI prophylaxis: famotidine Glucose control: monitor Mobility: up ad lib Code Status: full Family Communication:daughters and patient in 6n02 Disposition: med surg      SIGNATURE    Dr. Brand Males, M.D., F.C.C.P,  Pulmonary and Critical Care Medicine Staff Physician, [place] Director - Interstitial Lung Disease  Program  Pulmonary [place] at [place], [place], [address]  Pager: [phone], If no answer or between  15:00h - 7:00h: call [phone] Telephone: [phone]  3:07 PM 02/01/2019

## 2019-02-01 NOTE — Progress Notes (Signed)
SATURATION QUALIFICATIONS: (This note is used to comply with regulatory documentation for home oxygen)  Patient Saturations on Room Air at Rest = 90%  Patient Saturations on Room Air while Ambulating = 88%  Patient Saturations on 2 Liters of oxygen while Ambulating = 91%  Please briefly explain why patient needs home oxygen:

## 2019-02-02 ENCOUNTER — Other Ambulatory Visit: Payer: Self-pay

## 2019-02-02 ENCOUNTER — Inpatient Hospital Stay (HOSPITAL_COMMUNITY): Payer: Medicare Other

## 2019-02-02 DIAGNOSIS — J9601 Acute respiratory failure with hypoxia: Secondary | ICD-10-CM

## 2019-02-02 LAB — TROPONIN I: Troponin I: 0.03 ng/mL (ref <0.03)

## 2019-02-02 LAB — BRAIN NATRIURETIC PEPTIDE: B Natriuretic Peptide: 84.9 pg/mL (ref 0.0–100.0)

## 2019-02-02 MED ORDER — FUROSEMIDE 20 MG PO TABS
20.0000 mg | ORAL_TABLET | Freq: Two times a day (BID) | ORAL | Status: AC
  Administered 2019-02-02 – 2019-02-04 (×4): 20 mg via ORAL
  Filled 2019-02-02 (×4): qty 1

## 2019-02-02 NOTE — Progress Notes (Signed)
CRITICAL VALUE ALERT  Critical Value:  Troponin 0.03  Date & Time Notied:  1804  Provider Notified: MD Ramaswamy  Orders Received/Actions taken:  EKG 12 lead

## 2019-02-02 NOTE — Progress Notes (Signed)
NAME:  Karen Kaiser, MRN:  [id number], DOB:  05-01-1935, LOS: 2 ADMISSION DATE:  01/31/2019, CONSULTATION DATE: January 31, 2019 REFERRING MD: Lake Bells, CHIEF COMPLAINT: Dyspnea  Brief History    84 year old female with a past medical history significant for stage Ia lung adenocarcinoma, breast cancer and rheumatoid arthritis first presented to the [facility] pulmonary clinic on January 27, 2019 for evaluation of increasing shortness of breath, generalized fatigue, weakness and nausea.  She was noted to have multiple pulmonary nodules, some mediastinal lymphadenopathy.  She says that over the several weeks after the CT scan was performed that showed these abnormalities she developed worsening fatigue, nausea, and shortness of breath.  A repeat CT scan was performed which showed new bilateral pleural effusions right greater than left.  A thoracentesis was performed on January 30, 2019.  However, despite this her shortness of breath worsens.  She presented for an elective bronchoscopy on January 31, 2019 but because of shortness of breath it was felt that this was not safe.  Further, final results from the thoracentesis were pending.  She notes poor p.o. intake, significant nausea and vomiting at home.  She has a lot of weakness.  Past Medical History  Rheumatoid arthritis Peripheral vascular disease Breast cancer Lung cancer Gastroesophageal reflux disease Hypertension  Significant Hospital Events    3/14 - on o2, cytology still pending. She says she has aged 81 year in 3 months. Daughters at bedside. Want her in hospital till all procecures done. Desatruated walking on room air. Says doctors baffled at her worsening  Consults:    Procedures:  January 30, 2019 right-sided thoracentesis showed a lymphocytic predominant effusion, cytology pending  Significant Diagnostic Tests:  January 29, 2019 CT chest showed new right greater than left pleural effusion, increasing nodules bilaterally worrisome for  metastatic disease, mediastinal and hilar adenopathy stable if not decreased  Micro Data:  3/13 sputum culture >   Antimicrobials:  3/13 ceftriaxone >  3/13 azithro >   3/15  Interim history/subjective:    02/02/2019  - still no cytology. Chemistry  LDH 118 fluid. C/o ppor appetite and worsening appetite and also dyspnea. Daughters very concerned that reesults not back and plan for tomorrow. Also c/o gi discomfort from azithro. CXR with wrosenign bilateral eeffusion 02/02/2019 in my visualization  Objective   Blood pressure (!) 162/77, pulse 73, temperature 97.8 F (36.6 C), temperature source Oral, resp. rate 16, height 5\' 1"  (1.549 m), weight 61 kg, SpO2 99 %.        Intake/Output Summary (Last 24 hours) at 02/02/2019 1540 Last data filed at 02/02/2019 1145 Gross per 24 hour  Intake 2531.25 ml  Output 2300 ml  Net 231.25 ml   Filed Weights   01/31/19 1800 02/01/19 0155  Weight: 61.2 kg 61 kg     General Appearance:  Looks chronic unwell Head:  Normocephalic, without obvious abnormality, atraumatic Eyes:  PERRL - yes, conjunctiva/corneas - clear     Ears:  Normal external ear canals, both ears Nose:  G tube - no but has  Throat:  ETT TUBE - no , OG tube - no Neck:  Supple,  No enlargement/tenderness/nodules Lungs: Clear to auscultation bilaterally,  Heart:  S1 and S2 normal, no murmur, CVP - no.  Pressors - no Abdomen:  Soft, no masses, no organomegaly Genitalia / Rectal:  Not done Extremities:  Extremities- intact Skin:  ntact in exposed areas . Neurologic:  Sedation - none -> RASS - +1 . Moves all 4s -  yes. CAM-ICU - neg . Orientation - x3+         LABS    PULMONARY No results for input(s): PHART, PCO2ART, PO2ART, HCO3, TCO2, O2SAT in the last 168 hours.  Invalid input(s): PCO2, PO2  CBC Recent Labs  Lab 01/31/19 1215 02/01/19 0215  HGB 16.3* 14.4  HCT 46.3* 42.6  WBC 11.3* 9.7  PLT 244 217    COAGULATION No results for input(s): INR in the  last 168 hours.  CARDIAC  No results for input(s): TROPONINI in the last 168 hours. No results for input(s): PROBNP in the last 168 hours.   CHEMISTRY Recent Labs  Lab 01/31/19 1215 02/01/19 0215  NA 134* 133*  K 3.2* 3.3*  CL 96* 99  CO2 26 25  GLUCOSE 116* 108*  BUN 10 6*  CREATININE 0.65 0.59  CALCIUM 9.4 8.6*  MG 2.0  --   PHOS 3.3  --    Estimated Creatinine Clearance: 46.2 mL/min (by C-G formula based on SCr of 0.59 mg/dL).   LIVER Recent Labs  Lab 01/31/19 1215  AST 24  ALT 21  ALKPHOS 71  BILITOT 1.5*  PROT 6.6  ALBUMIN 3.7     INFECTIOUS Recent Labs  Lab 01/31/19 1215  PROCALCITON <0.10     ENDOCRINE CBG (last 3)  No results for input(s): GLUCAP in the last 72 hours.       IMAGING x48h  - image(s) personally visualized  -   highlighted in bold No results found.   Resolved Hospital Problem list     Assessment & Plan:  84 year old female with worsening shortness of breath, nausea, fatigue poor p.o. intake presented for an elective bronchoscopy today but notes worsening symptoms.  The differential diagnosis is broad but I am concerned her symptoms are primarily due to a currently undiagnosed malignancy, possibly recurrent lung cancer.  Differential diagnosis includes community-acquired pneumonia but she lacks the fever or chills to strongly suggest this.  I think it is best to cover it for now.  Increasing pulmonary nodules with new pleural effusions - at admit - s/op r thora 01/30/2019  3/15 - results pending. CXR with recurrence of effusion and now bilateral   PLAN Follow-up cytology from January 30, 2019 Get ECHO - rule out CHF Consider elective bronchoscopy on February 03, 2019 - provisionally will make NPO from 4am 02/03/19    Community-acquired pneumonia:  Ceftriaxone Azithromycin dc due to gi discomfort IV fluids - reduce to d5 half to kvo due to concern for chf Monitor O2 saturation   Poor p.o. intake, nausea and vomiting:   3/15 - complains of gi discomfort   Plan Check ekg and troponin 02/02/2019  PRN Zofran Regular diet but npo after 5am in case of bronc Gentle IV fluids - reduce to kvo  GERD: Continue home dose of famotidine  Rheumatoid arthritis: Hold home dose of Orencia  Allergic rhinitis: Continue Claritin  Hypertension: Continue home dose of bisoprolol and amlodipine and hctz and ARB (change for formulary version) Check BMET  Gout  Continue home dose of colchicine every other day  Best practice:  Diet: regular diet Pain/Anxiety/Delirium protocol (if indicated): n/a VAP protocol (if indicated): n/a DVT prophylaxis: lovenox GI prophylaxis: famotidine Glucose control: monitor Mobility: up ad lib Code Status: full Family Communication:daughters and patient in 6n02 Disposition: med surg      SIGNATURE    Dr. Brand Males, M.D., F.C.C.P,  Pulmonary and Critical Care Medicine Staff Physician, [facility] Director -  Interstitial Lung Disease  Program  Pulmonary [place] at [place], [place], [address]  Pager: [phone], If no answer or between  15:00h - 7:00h: call [phone] Telephone: [phone]  3:40 PM 02/02/2019

## 2019-02-03 ENCOUNTER — Other Ambulatory Visit: Payer: Self-pay | Admitting: Oncology

## 2019-02-03 ENCOUNTER — Other Ambulatory Visit (HOSPITAL_COMMUNITY): Payer: Medicare Other

## 2019-02-03 DIAGNOSIS — C801 Malignant (primary) neoplasm, unspecified: Secondary | ICD-10-CM

## 2019-02-03 DIAGNOSIS — Z853 Personal history of malignant neoplasm of breast: Secondary | ICD-10-CM

## 2019-02-03 DIAGNOSIS — R918 Other nonspecific abnormal finding of lung field: Secondary | ICD-10-CM

## 2019-02-03 DIAGNOSIS — R0602 Shortness of breath: Secondary | ICD-10-CM

## 2019-02-03 DIAGNOSIS — Z85118 Personal history of other malignant neoplasm of bronchus and lung: Secondary | ICD-10-CM

## 2019-02-03 LAB — CBC WITH DIFFERENTIAL/PLATELET
Abs Immature Granulocytes: 0.04 10*3/uL (ref 0.00–0.07)
Basophils Absolute: 0.1 10*3/uL (ref 0.0–0.1)
Basophils Relative: 1 %
Eosinophils Absolute: 0.2 10*3/uL (ref 0.0–0.5)
Eosinophils Relative: 3 %
HCT: 47.1 % — ABNORMAL HIGH (ref 36.0–46.0)
Hemoglobin: 15.6 g/dL — ABNORMAL HIGH (ref 12.0–15.0)
Immature Granulocytes: 0 %
Lymphocytes Relative: 30 %
Lymphs Abs: 2.9 10*3/uL (ref 0.7–4.0)
MCH: 32 pg (ref 26.0–34.0)
MCHC: 33.1 g/dL (ref 30.0–36.0)
MCV: 96.7 fL (ref 80.0–100.0)
MONOS PCT: 9 %
Monocytes Absolute: 0.8 10*3/uL (ref 0.1–1.0)
Neutro Abs: 5.7 10*3/uL (ref 1.7–7.7)
Neutrophils Relative %: 57 %
Platelets: 232 10*3/uL (ref 150–400)
RBC: 4.87 MIL/uL (ref 3.87–5.11)
RDW: 12.3 % (ref 11.5–15.5)
WBC: 9.7 10*3/uL (ref 4.0–10.5)
nRBC: 0 % (ref 0.0–0.2)

## 2019-02-03 LAB — BASIC METABOLIC PANEL
Anion gap: 9 (ref 5–15)
BUN: 8 mg/dL (ref 8–23)
CALCIUM: 9.1 mg/dL (ref 8.9–10.3)
CO2: 30 mmol/L (ref 22–32)
CREATININE: 0.67 mg/dL (ref 0.44–1.00)
Chloride: 96 mmol/L — ABNORMAL LOW (ref 98–111)
GFR calc Af Amer: >60 mL/min (ref >60)
GFR calc non Af Amer: >60 mL/min (ref >60)
Glucose, Bld: 93 mg/dL (ref 70–99)
Potassium: 3.5 mmol/L (ref 3.5–5.1)
Sodium: 135 mmol/L (ref 135–145)

## 2019-02-03 LAB — TROPONIN I: Troponin I: <0.03 ng/mL (ref <0.03)

## 2019-02-03 LAB — PHOSPHORUS: Phosphorus: 3.7 mg/dL (ref 2.5–4.6)

## 2019-02-03 LAB — PROTIME-INR
INR: 1 (ref 0.8–1.2)
Prothrombin Time: 13.1 seconds (ref 11.4–15.2)

## 2019-02-03 LAB — MAGNESIUM: Magnesium: 1.8 mg/dL (ref 1.7–2.4)

## 2019-02-03 MED ORDER — ENOXAPARIN SODIUM 40 MG/0.4ML ~~LOC~~ SOLN
40.0000 mg | SUBCUTANEOUS | Status: DC
  Administered 2019-02-03 – 2019-02-05 (×3): 40 mg via SUBCUTANEOUS
  Filled 2019-02-03 (×3): qty 0.4

## 2019-02-03 NOTE — Telephone Encounter (Signed)
Called patient unable to reach left message to call us back.

## 2019-02-03 NOTE — Progress Notes (Addendum)
NAME:  Karen Kaiser, MRN:  [id number], DOB:  12-26-1934, LOS: 3 ADMISSION DATE:  01/31/2019, CONSULTATION DATE: January 31, 2019 REFERRING MD: Lake Bells, CHIEF COMPLAINT: Dyspnea  Synopsis   84 year old female with a past medical history significant for stage Ia lung adenocarcinoma, breast cancer and rheumatoid arthritis first presented to the [facility] pulmonary clinic on January 27, 2019 for evaluation of increasing shortness of breath, generalized fatigue, weakness and nausea.  She was noted to have multiple pulmonary nodules, some mediastinal lymphadenopathy.  She says that over the several weeks after the CT scan was performed that showed these abnormalities she developed worsening fatigue, nausea, and shortness of breath.  A repeat CT scan was performed which showed new bilateral pleural effusions right greater than left.  A thoracentesis was performed on January 30, 2019.  However, despite this her shortness of breath worsens.  She presented for an elective bronchoscopy on January 31, 2019 but because of shortness of breath it was felt that this was not safe.  Further, final results from the thoracentesis were pending.  She notes poor p.o. intake, significant nausea and vomiting at home.  She has a lot of weakness.  Past Medical History  Rheumatoid arthritis Peripheral vascular disease Breast cancer Lung cancer Gastroesophageal reflux disease Hypertension  Significant Hospital Events    3/14 - on o2, cytology still pending. She says she has aged 25 year in 3 months. Daughters at bedside. Want her in hospital till all procecures done. Desatruated walking on room air. Says doctors baffled at her worsening  Consults:  3/16 Oncology  Procedures:  January 30, 2019 right-sided thoracentesis showed a lymphocytic predominant effusion, cytology preliminary report shows carcinoma, special stains pending  Significant Diagnostic Tests:  January 29, 2019 CT chest showed new right greater than left pleural  effusion, increasing nodules bilaterally worrisome for metastatic disease, mediastinal and hilar adenopathy stable if not decreased  Micro Data:  3/13 sputum culture > pending  Antimicrobials:  3/13 ceftriaxone > 3/16 3/13 azithro >   3/15  Interim history/subjective:    Feels worse than last week Weaker than before Dyspnea Cough with mucus production  Objective   Blood pressure (!) 171/90, pulse 76, temperature 97.6 F (36.4 C), temperature source Oral, resp. rate 18, height 5\' 1"  (1.549 m), weight 61.2 kg, SpO2 100 %.        Intake/Output Summary (Last 24 hours) at 02/03/2019 1418 Last data filed at 02/03/2019 0500 Gross per 24 hour  Intake 1293.75 ml  Output -  Net 1293.75 ml   Filed Weights   01/31/19 1800 02/01/19 0155 02/03/19 0011  Weight: 61.2 kg 61 kg 61.2 kg     General:  Generally very weak, resting comfortably in bed HENT: NCAT OP clear PULM: CTA B, normal effort CV: RRR, no mgr GI: BS+, soft, nontender MSK: normal bulk and tone Neuro: awake, alert, no distress, MAEW       LABS    PULMONARY No results for input(s): PHART, PCO2ART, PO2ART, HCO3, TCO2, O2SAT in the last 168 hours.  Invalid input(s): PCO2, PO2  CBC Recent Labs  Lab 01/31/19 1215 02/01/19 0215 02/03/19 0242  HGB 16.3* 14.4 15.6*  HCT 46.3* 42.6 47.1*  WBC 11.3* 9.7 9.7  PLT 244 217 232    COAGULATION Recent Labs  Lab 02/03/19 0242  INR 1.0    CARDIAC   Recent Labs  Lab 02/02/19 1659 02/03/19 0242  TROPONINI 0.03* <0.03   No results for input(s): PROBNP in the last 168  hours.   CHEMISTRY Recent Labs  Lab 01/31/19 1215 02/01/19 0215 02/03/19 0242  NA 134* 133* 135  K 3.2* 3.3* 3.5  CL 96* 99 96*  CO2 26 25 30   GLUCOSE 116* 108* 93  BUN 10 6* 8  CREATININE 0.65 0.59 0.67  CALCIUM 9.4 8.6* 9.1  MG 2.0  --  1.8  PHOS 3.3  --  3.7   Estimated Creatinine Clearance: 46.3 mL/min (by C-G formula based on SCr of 0.67 mg/dL).   LIVER Recent Labs   Lab 01/31/19 1215 02/03/19 0242  AST 24  --   ALT 21  --   ALKPHOS 71  --   BILITOT 1.5*  --   PROT 6.6  --   ALBUMIN 3.7  --   INR  --  1.0     INFECTIOUS Recent Labs  Lab 01/31/19 1215  PROCALCITON <0.10     ENDOCRINE CBG (last 3)  No results for input(s): GLUCAP in the last 72 hours.       IMAGING x48h  - image(s) personally visualized  -   highlighted in bold Dg Chest Port 1 View  Result Date: 02/02/2019 CLINICAL DATA:  Shortness of breath EXAM: PORTABLE CHEST 1 VIEW COMPARISON:  January 30, 2019. FINDINGS: There is consolidation in the left lower lobe. There are small pleural effusions bilaterally. Heart is borderline enlarged with pulmonary vascularity normal. No adenopathy. There is aortic atherosclerosis. Bones are osteoporotic. IMPRESSION: Left lower lobe airspace consolidation consistent with pneumonia. Small pleural effusions bilaterally. Stable cardiac prominence. Aortic Atherosclerosis (ICD10-I70.0). Electronically Signed   By: Lowella Grip III M.D.   On: 02/02/2019 16:17     Resolved Hospital Problem list     Assessment & Plan:  84 year old female with worsening shortness of breath, nausea, fatigue poor p.o. intake presented for an elective bronchoscopy on 3/14 but noted increased dyspnea and weakness.  Cytology report from R thoracentesis on 3/12 showed malignant cells.  Increasing pulmonary nodules with new pleural effusions - at admit - s/op r thora 01/30/2019 > picture consistent with malignancy of uncertain etiology, recurrent lung?  PLAN Follow up cytology final report I have called Dr. Griffith Citron and left a message for him, the patient would like him to see her in consulation   Community-acquired pneumonia: resolved Stop antibiotics   Poor p.o. intake, nausea and vomiting:  Plan Continue gentle IV fluids Advance diet, regular diet  GERD: famotidine  Rheumatoid arthritis: Hold home dose of orencia   Allergic rhinitis: Continue  claritin  Hypertension: Continue home dose of bisoprolol and amlodipine and hctz and ARB (change for formulary version) Continue bisoprolol, amlodipine, HCTZ, ARB  Gout  Continue home dose of colchicine  Best practice:  Diet: regular diet Pain/Anxiety/Delirium protocol (if indicated): n/a VAP protocol (if indicated): n/a DVT prophylaxis: lovenox GI prophylaxis: famotidine Glucose control: monitor Mobility: up ad lib Code Status: full Family Communication: daughters, patient and husband updated bedside at length on 3/16 Disposition: med surg    SIGNATURE   > 50% of this 35 min visit spent face to face  Roselie Awkward, MD [facility] Pager: [phone] Cell: [phone] If no response, call [phone]   2:18 PM 02/03/2019

## 2019-02-03 NOTE — Plan of Care (Signed)

## 2019-02-03 NOTE — Progress Notes (Addendum)
ID: Karen Kaiser   DOB: 1935-09-17  MR#: [id number]  GUY#:[id number]  Patient Care Team: Lajean Manes, MD as PCP - General (Internal Medicine) Marionette Meskill, Virgie Dad, MD as Consulting Physician (Oncology) Hennie Duos, MD as Consulting Physician (Rheumatology) Gaynelle Arabian, MD as Consulting Physician (Orthopedic Surgery) Clarene Essex, MD as Consulting Physician (Gastroenterology) OTHER MD:   CHIEF COMPLAINTS:  1) Hx of right lung adenocarcinoma      2)  Hx of right DCIS           CURRENT TREATMENT: Observation  HISTORY OF PRESENT ILLNESS:  from the prior summary:  Patient has a remote history of DCIS, and is status post right mastectomy in 1998. She was then diagnosed with lung adenocarcinoma and underwent a right lower lobectomy in April 2004 for 1.9 cm mass. There was no lymph node involvement. Margins were negative. As of December 2019, she had no evidence of recurrence and the patient is followed with observation alone.  Comorbidities include osteoarthritis, peripheral vascular disease, hypertension, and rheumatoid arthritis.  INTERVAL HISTORY: Tyshell was admitted to the hospital due to increasing shortness of breath and worsening cough.  The patient states he is symptoms have progressed over the past 1 to 2 months.  She has been followed closely by pulmonology and underwent a CT scan of the chest without contrast on 01/28/2019.  CT scan showed new small to moderate likely loculated bilateral pleural effusions, new and increasing bilateral pulmonary nodules and interlobular septal thickening, and new small sclerotic bony lesions, compatible with increasing metastatic disease.  She underwent a thoracentesis for the right pleural effusion on 01/30/2019.  400 cc of clear yellow fluid was removed.  Samples were sent for pathology.  Preliminary report shows cells consistent with metastatic disease.  Additional stains are pending to determine the primary site.  REVIEW OF SYSTEMS: The  patient reports worsening fatigue over the past few months.  Reports a decreased appetite but thinks she has lost only a few pounds.  Reports progressive cough with clear/frothy sputum production and shortness of breath.  She has not had any hemoptysis.  Denies chest discomfort.  Reports persistent nausea and vomits only after getting into a coughing spell.  Denies dizziness but reports having headaches when she wakes up in the morning.  These headaches resolve after she takes her Tylenol which she takes daily due to her arthritis.  She has had some visual changes but thinks this is related to her glaucoma.  Reports generalized arthralgias but does not think these have worsened.  She attributes these to her arthritis.  Denies bowel changes.  Denies fevers, bleeding, rashes. A detailed review of systems was otherwise noncontributory.  Medical oncology was asked to see the patient to make recommendations regarding her condition.  PAST MEDICAL HISTORY: Past Medical History:  Diagnosis Date  . Arthritis    generalized arthrtis , R.A.   . Cancer [facility])    breast mastectomy - 1996 lung ca 2004- RLL  . Dyspnea   . Family history of adverse reaction to anesthesia    DAUGHTER HAS NAUSEA  ALSO  . GERD (gastroesophageal reflux disease)   . Glaucoma   . Heart murmur    as a child   . Hypertension   . Lung cancer ([place])   . Peripheral vascular disease (HCC)    varciose veins in right leg   . Pneumonia    hosp. with pneumonia spring 2017  . PONV (postoperative nausea and vomiting)   . Recurrent upper  respiratory infection (URI)    sinus infection 1/13 2/13 for tx     PAST SURGICAL HISTORY: Past Surgical History:  Procedure Laterality Date  . BREAST BIOPSY     left breast biopsy 2006   . cataract surgery    . DILATION AND CURETTAGE OF UTERUS    . EYE SURGERY     bilateral cataract srugery   . IR THORACENTESIS ASP PLEURAL SPACE W/IMG GUIDE  01/30/2019  . LOBECTOMY     RLL   . MASTECTOMY      right   . OTHER SURGICAL HISTORY     right rotator cuff surgery   . ROTATOR CUFF REPAIR Right   . TOTAL KNEE ARTHROPLASTY  02/12/2012   Procedure: TOTAL KNEE ARTHROPLASTY;  Surgeon: Gearlean Alf, MD;  Location: [facility];  Service: Orthopedics;  Laterality: Right;    FAMILY HISTORY Family History  Problem Relation Age of Onset  . Stroke Mother   . Stroke Father   . Breast cancer Sister 53   Cancer. sister (ovarian, breast) and brother (lung and brain)  Cerebrovascular Accident. mother and father  Diabetes Mellitus. mother and sister  Hypertension. mother, sister and child  Osteoarthritis. mother  Father. deceased age 9, stroke  Mother. deceased age 70, stroke. DM, HTN  SOCIAL HISTORY:  (Reviewed 02/03/2019)  She is currently retired. Patient is married and lives with her spouse, [name].  Her husband has cognitive impairment and she is his primary caregiver.  The patient has 2 daughters, one who lives in [place] and another who lives near [place].  The patient has 2 stepchildren, 1 who lives in [place] and the other lives in [place].      ADVANCED DIRECTIVES:  In place.  Advises that her daughter, Margreta Journey, is her healthcare power of attorney.  HEALTH MAINTENANCE:  (updated 02/03/2019) Social History   Tobacco Use  . Smoking status: Never Smoker  . Smokeless tobacco: Never Used  Substance Use Topics  . Alcohol use: No  . Drug use: No     Mammogram: Left breast mammogram performed on 02/26/2018  Colonoscopy: 2015  PAP:  Not on file  Bone density:  March 2015, osteoporosis with a T score -3.1 (followed by Dr. Cecil Cobbs)  Lipid panel:  UTD,  Allergies  Allergen Reactions  . Amlodipine Besy-Benazepril Hcl Swelling    10mg  or higher?? Per patient  . Latex Other (See Comments)    Blister/itching/swelling  . Rhopressa [Netarsudil Dimesylate] Other (See Comments)    Eye redness  . Simponi Aria [Golimumab]     Pt. Developed pneumonia with the second dose  .  Xalatan [Latanoprost] Itching  . Plaquenil [Hydroxychloroquine Sulfate] Itching and Rash    Current Facility-Administered Medications  Medication Dose Route Frequency Provider Last Rate Last Dose  . acetaminophen (TYLENOL) tablet 650 mg  650 mg Oral Q4H PRN Juanito Doom, MD   650 mg at 01/31/19 2122  . amLODipine (NORVASC) tablet 2.5 mg  2.5 mg Oral QPM Simonne Maffucci B, MD   2.5 mg at 02/02/19 1854  . bisoprolol (ZEBETA) tablet 10 mg  10 mg Oral QAC breakfast Simonne Maffucci B, MD   10 mg at 02/03/19 0811  . brimonidine (ALPHAGAN) 0.15 % ophthalmic solution 1 drop  1 drop Both Eyes BID Simonne Maffucci B, MD   1 drop at 02/03/19 1004  . calcium-vitamin D (OSCAL WITH D) 500-200 MG-UNIT per tablet 1 tablet  1 tablet Oral TID Juanito Doom, MD   1 tablet  at 02/02/19 2113  . cholecalciferol (VITAMIN D) tablet 1,000 Units  1,000 Units Oral Q MTWThF Juanito Doom, MD   1,000 Units at 01/31/19 1317  . colchicine tablet 0.6 mg  0.6 mg Oral Lorelle Gibbs B, MD   0.6 mg at 02/02/19 0906  . dextrose 5 % and 0.45 % NaCl with KCl 20 mEq/L infusion   Intravenous Continuous Brand Males, MD 10 mL/hr at 02/02/19 1752    . dorzolamide-timolol (COSOPT) 22.3-6.8 MG/ML ophthalmic solution 1 drop  1 drop Both Eyes BID Simonne Maffucci B, MD   1 drop at 02/03/19 1004  . famotidine (PEPCID) tablet 20 mg  20 mg Oral QHS Simonne Maffucci B, MD   20 mg at 02/02/19 2113  . furosemide (LASIX) tablet 20 mg  20 mg Oral BID Brand Males, MD   20 mg at 02/03/19 0811  . hydrochlorothiazide (MICROZIDE) capsule 12.5 mg  12.5 mg Oral Daily Simonne Maffucci B, MD   12.5 mg at 02/03/19 7564  . irbesartan (AVAPRO) tablet 37.5 mg  37.5 mg Oral Daily Simonne Maffucci B, MD   37.5 mg at 02/03/19 0811  . loratadine (CLARITIN) tablet 10 mg  10 mg Oral Daily Juanito Doom, MD   10 mg at 02/02/19 0905  . multivitamin with minerals tablet 1 tablet  1 tablet Oral Q lunch Juanito Doom, MD   1  tablet at 02/03/19 1441  . ondansetron (ZOFRAN) injection 4 mg  4 mg Intravenous Q6H PRN Simonne Maffucci B, MD   4 mg at 02/02/19 1607    OBJECTIVE: Elderly white woman in no acute distress  Vitals:   02/03/19 0430 02/03/19 1442  BP: (!) 171/90 (!) 155/87  Pulse: 76 76  Resp:  16  Temp: 97.6 F (36.4 C)   SpO2: 100% 97%     Body mass index is 25.49 kg/m.    ECOG FS: 1 Filed Weights   01/31/19 1800 02/01/19 0155 02/03/19 0011  Weight: 134 lb 14.7 oz (61.2 kg) 134 lb 7.7 oz (61 kg) 134 lb 14.7 oz (61.2 kg)   Sclerae unicteric, EOMs intact Oropharynx clear and moist No cervical or supraclavicular adenopathy Lungs diminished breath sounds to the bilateral bases. Heart regular rate and rhythm Abd soft, nontender, positive bowel sounds MSK no focal spinal tenderness, no upper extremity lymphedema Neuro: nonfocal, well oriented, appropriate affect Breasts: Status post right mastectomy with no evidence of chest wall recurrence.  Left breast is unremarkable.  Both axillae are benign.  LAB RESULTS:   Lab Results  Component Value Date   WBC 9.7 02/03/2019   NEUTROABS 5.7 02/03/2019   HGB 15.6 (H) 02/03/2019   HCT 47.1 (H) 02/03/2019   MCV 96.7 02/03/2019   PLT 232 02/03/2019      Chemistry      Component Value Date/Time   NA 135 02/03/2019 0242   NA 141 10/17/2016 1137   K 3.5 02/03/2019 0242   K 4.2 10/17/2016 1137   CL 96 (L) 02/03/2019 0242   CO2 30 02/03/2019 0242   CO2 27 10/17/2016 1137   BUN 8 02/03/2019 0242   BUN 17.2 10/17/2016 1137   CREATININE 0.67 02/03/2019 0242   CREATININE 0.78 10/29/2018 1147   CREATININE 0.8 10/17/2016 1137      Component Value Date/Time   CALCIUM 9.1 02/03/2019 0242   CALCIUM 9.9 10/17/2016 1137   ALKPHOS 71 01/31/2019 1215   ALKPHOS 70 10/17/2016 1137   AST 24 01/31/2019 1215  AST 22 10/29/2018 1147   AST 15 10/17/2016 1137   ALT 21 01/31/2019 1215   ALT 18 10/29/2018 1147   ALT 14 10/17/2016 1137   BILITOT 1.5 (H)  01/31/2019 1215   BILITOT 1.0 10/29/2018 1147   BILITOT 0.58 10/17/2016 1137       STUDIES: Dg Chest 1 View  Result Date: 01/30/2019 CLINICAL DATA:  Post right-sided thoracentesis EXAM: CHEST  1 VIEW COMPARISON:  Chest CT-01/28/2019 FINDINGS: Grossly unchanged cardiac silhouette and mediastinal contours. Interval reduction in persistent small potentially partially loculated right-sided effusion post thoracentesis. No pneumothorax. Unchanged small left-sided pleural effusion. Improved aeration of the right lung base with persistent right basilar opacities, likely atelectasis or scar. Grossly unchanged scattered bilateral pulmonary nodules with diffuse nodular thickening of the pulmonary interstitium. Minimal biapical pleuroparenchymal thickening, right greater than left. Pulmonary vasculature remains indistinct with cephalization of flow. No acute osseous abnormalities. Moderate scoliotic curvature of the thoracolumbar spine. IMPRESSION: 1. Interval reduction in persistent small potentially partially loculated right-sided effusion post thoracentesis. No pneumothorax. 2. Improved aeration of right lung base with persistent right basilar opacities, likely atelectasis. 3. Unchanged small potentially loculated left-sided pleural effusion associated left basilar opacities. 4. Similar findings suggestive of metastatic disease to the chest (as better demonstrated on preceding chest CT) with suspected superimposed pulmonary edema. Electronically Signed   By: Sandi Mariscal M.D.   On: 01/30/2019 16:12   Ct Chest Wo Contrast  Result Date: 01/28/2019 CLINICAL DATA:  84 year old female for follow-up of pulmonary nodules. History of lung cancer in 2004 with lobectomy and RIGHT breast cancer in 1996 with mastectomy. EXAM: CT CHEST WITHOUT CONTRAST TECHNIQUE: Multidetector CT imaging of the chest was performed following the standard protocol without IV contrast. COMPARISON:  10/29/2018 and prior CTs FINDINGS:  Cardiovascular: Cardiomegaly again noted. Coronary artery and aortic atherosclerotic calcifications again identified. No thoracic aortic aneurysm or pericardial effusion. Mediastinum/Nodes: Prevascular/AP window lymph node (series 2: Image 49) now measures 8 mm, previously 11 mm. LEFT suprahilar node is difficult to accurately measure on this noncontrast study, but does not appear increased in size. No enlarging mediastinal or axillary lymph nodes identified. Lungs/Pleura: New small to moderate bilateral pleural effusions are noted, RIGHT greater than LEFT, and appears somewhat loculated. Bilateral pulmonary nodules (some new, some stable and some enlarging) and interlobular septal thickening within both lungs identified and compatible with increasing metastatic disease. Index pulmonary nodules are as follows: A 10 mm RIGHT nodule (5:64), previously 9 mm A stable 6 mm RIGHT UPPER lobe nodule (5:41) A 7 mm LEFT UPPER lobe nodule (5:71) previously 6 mm. A 8 mm RIGHT UPPER lobe nodule (5:32) previously 6 mm. Upper Abdomen: No acute abnormality Musculoskeletal: New small sclerotic lesions within multiple thoracic vertebra are identified as well as within the LEFT 10th rib, compatible with increasing metastatic disease. IMPRESSION: 1. New small to moderate likely loculated bilateral pleural effusions, new and increasing bilateral pulmonary nodules and interlobular septal thickening, and new small sclerotic bony lesions, compatible with increasing metastatic disease. 2. Cardiomegaly 3. Coronary artery and Aortic Atherosclerosis (ICD10-I70.0). Electronically Signed   By: Margarette Canada M.D.   On: 01/28/2019 17:40   Dg Chest Port 1 View  Result Date: 02/02/2019 CLINICAL DATA:  Shortness of breath EXAM: PORTABLE CHEST 1 VIEW COMPARISON:  January 30, 2019. FINDINGS: There is consolidation in the left lower lobe. There are small pleural effusions bilaterally. Heart is borderline enlarged with pulmonary vascularity normal. No  adenopathy. There is aortic atherosclerosis. Bones are osteoporotic. IMPRESSION:  Left lower lobe airspace consolidation consistent with pneumonia. Small pleural effusions bilaterally. Stable cardiac prominence. Aortic Atherosclerosis (ICD10-I70.0). Electronically Signed   By: Lowella Grip III M.D.   On: 02/02/2019 16:17   Ir Thoracentesis Asp Pleural Space W/img Guide  Result Date: 01/30/2019 INDICATION: History of right lower lobectomy. Right pleural effusion. Request for diagnostic and therapeutic thoracentesis. EXAM: ULTRASOUND GUIDED RIGHT THORACENTESIS MEDICATIONS: 1% lidocaine 10 mL COMPLICATIONS: None immediate. PROCEDURE: An ultrasound guided thoracentesis was thoroughly discussed with the patient and questions answered. The benefits, risks, alternatives and complications were also discussed. The patient understands and wishes to proceed with the procedure. Written consent was obtained. Ultrasound was performed to localize and mark an adequate pocket of fluid in the right chest. The area was then prepped and draped in the normal sterile fashion. 1% Lidocaine was used for local anesthesia. Under ultrasound guidance a 6 Fr Safe-T-Centesis catheter was introduced. Thoracentesis was performed. The catheter was removed and a dressing applied. FINDINGS: A total of approximately 400 mL of clear yellow fluid was removed. Samples were sent to the laboratory as requested by the clinical team. IMPRESSION: Successful ultrasound guided right thoracentesis yielding 400 mL of pleural fluid. No pneumothorax on post-procedure chest x-ray. Read by: Gareth Eagle, PA-C Electronically Signed   By: Corrie Mckusick D.O.   On: 01/30/2019 16:10     ASSESSMENT: 84 y.o. [place] woman status post:  (1) Right lower lobectomy April 2004 for a 1.9-cm lung adenocarcinoma, with no lymph node involvement, negative margins, and no evidence of recurrence so far.  (2) Status post right mastectomy in 1998 for ductal carcinoma in  situ.  (a) left ductectomy February 2005 for what proved to be benign changes.  (3) small bilateral pulmonary nodules noted on chest CT scan 04/05/2016, below level of resolution by PET scan 04/24/2016   (a) repeat chest CT 10/17/2016  stable  (b) chest CT scan 10/24/2017 again stable (8-10 nodules per lung, all less than 5 mm, all unchanged)  (c) CT chest 10/30/2018 shows again multiple pulmonary nodules, now slightly larger  (4) chest CT scan on 01/28/2019 for symptomatic SOB shows new small to moderate likely loculated bilateral pleural effusions, new and increasing bilateral pulmonary nodules and interlobular septal thickening, and new small sclerotic bony lesions, compatible with increasing metastatic disease.  (a) Right  thoracentesis 01/30/2019 suggests malignant cells on preliminary, final pending  PLAN: Aina is over 15 years out from definitive surgery for her stage I lung cancer.  She is status post 22 years out from definitive surgery for her noninvasive breast cancer.  She has developed progressive shortness of breath developed pleural effusions and progressive pulmonary lesions.  CT scan also showed small sclerotic bony lesions.  The pleural fluid cytology suggests metastatic cancer.  Final pathology is currently pending.   Will await further studies to make further recommendations.  The patient will need additional imaging including a PET scan which will need to be done on an outpatient basis.  She may also need an MRI of her brain.  Mikey Bussing, DNP, AGPCNP-BC, AOCNP   ADDENDUM: Whatever is growing in [place]' lungs has taken > 2 years to cause her symptoms. It is not likely to be her breast cancer, which was noninvasive. It is more likely a recurrent or new lung cancer. If a definitive diagnosis cannot be made from cytology she will need an outpatient PET scan to r/o a possible occult source. The PET can also complete her staging and serve as a baseline  if more than  supportive/comfort care is planned.  Treatment of lung cancer, if that is what we are dealing with, depends on the presence or absence of murtations which can guide therapy. In many cases significant and durable responses (years) can be obtained with TKI inhibitors or similar targeted agents.   While waiting for final results appreciate pulmonary's efforts to stabilize and improve patient's functinal status with a view to discharge home or to SNF.  Will discuss further with patient and family once more results are available and follow with you.  Chauncey Cruel, MD Medical Oncology and Hematology [facility] [address] [place], [place] [address] Tel. [phone]    Fax. [phone]

## 2019-02-04 ENCOUNTER — Inpatient Hospital Stay (HOSPITAL_COMMUNITY): Payer: Medicare Other

## 2019-02-04 DIAGNOSIS — C799 Secondary malignant neoplasm of unspecified site: Secondary | ICD-10-CM

## 2019-02-04 LAB — HEPATIC FUNCTION PANEL
ALT: 23 U/L (ref 0–44)
AST: 23 U/L (ref 15–41)
Albumin: 3 g/dL — ABNORMAL LOW (ref 3.5–5.0)
Alkaline Phosphatase: 72 U/L (ref 38–126)
BILIRUBIN DIRECT: 0.2 mg/dL (ref 0.0–0.2)
Indirect Bilirubin: 0.4 mg/dL (ref 0.3–0.9)
Total Bilirubin: 0.6 mg/dL (ref 0.3–1.2)
Total Protein: 6.1 g/dL — ABNORMAL LOW (ref 6.5–8.1)

## 2019-02-04 LAB — CBC WITH DIFFERENTIAL/PLATELET
Abs Immature Granulocytes: 0.05 10*3/uL (ref 0.00–0.07)
Basophils Absolute: 0.1 10*3/uL (ref 0.0–0.1)
Basophils Relative: 1 %
Eosinophils Absolute: 0.2 10*3/uL (ref 0.0–0.5)
Eosinophils Relative: 2 %
HCT: 45.4 % (ref 36.0–46.0)
Hemoglobin: 15.5 g/dL — ABNORMAL HIGH (ref 12.0–15.0)
Immature Granulocytes: 1 %
Lymphocytes Relative: 29 %
Lymphs Abs: 2.9 10*3/uL (ref 0.7–4.0)
MCH: 33 pg (ref 26.0–34.0)
MCHC: 34.1 g/dL (ref 30.0–36.0)
MCV: 96.6 fL (ref 80.0–100.0)
Monocytes Absolute: 1.1 10*3/uL — ABNORMAL HIGH (ref 0.1–1.0)
Monocytes Relative: 11 %
Neutro Abs: 5.8 10*3/uL (ref 1.7–7.7)
Neutrophils Relative %: 56 %
Platelets: 270 10*3/uL (ref 150–400)
RBC: 4.7 MIL/uL (ref 3.87–5.11)
RDW: 12.4 % (ref 11.5–15.5)
WBC: 10.2 10*3/uL (ref 4.0–10.5)
nRBC: 0 % (ref 0.0–0.2)

## 2019-02-04 LAB — MAGNESIUM: Magnesium: 2.1 mg/dL (ref 1.7–2.4)

## 2019-02-04 LAB — CULTURE, BODY FLUID W GRAM STAIN -BOTTLE: Culture: NO GROWTH

## 2019-02-04 LAB — PHOSPHORUS: Phosphorus: 4.1 mg/dL (ref 2.5–4.6)

## 2019-02-04 NOTE — Evaluation (Signed)
Physical Therapy Evaluation Patient Details Name: Karen Kaiser MRN: [id number] DOB: [birth year] Today's Date: 02/04/2019   History of Present Illness  84 year old female with a past medical history significant for stage Ia lung adenocarcinoma, breast cancer and rheumatoid arthritis first presented to the [facility] pulmonary clinic on January 27, 2019 for evaluation of increasing shortness of breath, generalized fatigue, weakness and nausea. January 29, 2019 CT chest showed new right greater than left pleural effusion, increasing nodules bilaterally worrisome for metastatic disease, mediastinal and hilar adenopathy stable if not decreased. January 30, 2019 right-sided thoracentesis showed a lymphocytic predominant effusion, cytology preliminary report shows carcinoma.  Clinical Impression  Pt admitted with above diagnosis. Pt currently with functional limitations due to the deficits listed below (see PT Problem List). Pt is limited in safe mobility by generalized weakness and instability. Pt currently min guard for transfers and ambulation. Pt would benefit from use of RW for increased stability with mobility in future sessions. Pt will benefit from skilled PT to increase their independence and safety with mobility to allow discharge to the venue listed below.       Follow Up Recommendations Home health PT;Supervision/Assistance - 24 hour    Equipment Recommendations  None recommended by PT       Precautions / Restrictions Precautions Precautions: None Restrictions Weight Bearing Restrictions: No      Mobility  Bed Mobility               General bed mobility comments: OOB in recliner on entry   Transfers Overall transfer level: Needs assistance Equipment used: None Transfers: Sit to/from Stand Sit to Stand: Min guard         General transfer comment: min guard for safety, mild instability on initial standing  Ambulation/Gait Ambulation/Gait assistance: Min guard Gait  Distance (Feet): 150 Feet Assistive device: Rolling walker (2 wheeled);None Gait Pattern/deviations: Step-through pattern;Decreased stride length;Shuffle Gait velocity: slowed Gait velocity interpretation: 1.31 - 2.62 ft/sec, indicative of limited community ambulator General Gait Details: hands on min guard for safety, 3x minor instability pt able to self correct        Balance Overall balance assessment: Needs assistance Sitting-balance support: Feet supported;No upper extremity supported Sitting balance-Leahy Scale: Good     Standing balance support: No upper extremity supported;During functional activity Standing balance-Leahy Scale: Fair                               Pertinent Vitals/Pain Pain Assessment: Faces Faces Pain Scale: Hurts a little bit Pain Location: generalized with movement Pain Descriptors / Indicators: Aching;Sore Pain Intervention(s): Limited activity within patient's tolerance;Monitored during session;Repositioned    Home Living Family/patient expects to be discharged to:: Private residence Living Arrangements: Spouse/significant other Available Help at Discharge: Available 24 hours/day;Family Type of Home: House Home Access: Stairs to enter Entrance Stairs-Rails: Can reach both Entrance Stairs-Number of Steps: 5 Home Layout: One level Home Equipment: Grab bars - tub/shower;Shower seat;Walker - 2 wheels;Bedside commode      Prior Function Level of Independence: Independent         Comments: had given up housework to husband     Hand Dominance        Extremity/Trunk Assessment   Upper Extremity Assessment Upper Extremity Assessment: Generalized weakness    Lower Extremity Assessment Lower Extremity Assessment: Generalized weakness    Cervical / Trunk Assessment Cervical / Trunk Assessment: Kyphotic  Communication   Communication: No difficulties  Cognition  Arousal/Alertness: Awake/alert Behavior During Therapy: WFL  for tasks assessed/performed Overall Cognitive Status: Within Functional Limits for tasks assessed                                        General Comments General comments (skin integrity, edema, etc.): Pt two daughters and husband present during session. Educated pt on her overall good mobility, which would be helped with use of RW, however not really a candidate for SNF level care, especially since daughters are offering 24 hour assist. Conducted ambulating oxygen saturation, see note.         Assessment/Plan    PT Assessment Patient needs continued PT services  PT Problem List Decreased strength;Decreased mobility;Decreased activity tolerance;Cardiopulmonary status limiting activity       PT Treatment Interventions DME instruction;Gait training;Stair training;Functional mobility training;Therapeutic activities;Therapeutic exercise;Balance training;Cognitive remediation;Patient/family education    PT Goals (Current goals can be found in the Care Plan section)  Acute Rehab PT Goals Patient Stated Goal: move better PT Goal Formulation: With patient/family Time For Goal Achievement: 02/18/19 Potential to Achieve Goals: Fair    Frequency Min 3X/week    AM-PAC PT "6 Clicks" Mobility  Outcome Measure Help needed turning from your back to your side while in a flat bed without using bedrails?: A Little Help needed moving from lying on your back to sitting on the side of a flat bed without using bedrails?: A Little Help needed moving to and from a bed to a chair (including a wheelchair)?: None Help needed standing up from a chair using your arms (e.g., wheelchair or bedside chair)?: None Help needed to walk in hospital room?: A Little Help needed climbing 3-5 steps with a railing? : A Little 6 Click Score: 20    End of Session Equipment Utilized During Treatment: Gait belt;Oxygen Activity Tolerance: Patient tolerated treatment well Patient left: in chair;with call  bell/phone within reach;with family/visitor present Nurse Communication: Mobility status PT Visit Diagnosis: Unsteadiness on feet (R26.81);Other abnormalities of gait and mobility (R26.89);Muscle weakness (generalized) (M62.81);Difficulty in walking, not elsewhere classified (R26.2)    Time: 3419-6222 PT Time Calculation (min) (ACUTE ONLY): 43 min   Charges:   PT Evaluation $PT Eval Moderate Complexity: 1 Mod PT Treatments $Gait Training: 23-37 mins        Patric Vanpelt B. Migdalia Dk PT, DPT Acute Rehabilitation Services Pager [phone] Office [phone]   [place] 02/04/2019, 5:33 PM

## 2019-02-04 NOTE — Progress Notes (Signed)
Responded to unit referral to visit with patient who had received additional bad news.  Visited with patient and daughters were at bedside.  Patient very pleasant and welcome chaplain visit.  We discuss her health and what would be helpful to her.  Provided prayer, ministry of presence, empathetic listening and emotional support.  Will follow as needed.  Jaclynn Major, [place], [facility], Pager [phone]

## 2019-02-04 NOTE — Progress Notes (Signed)
NAME:  Karen Kaiser, MRN:  [id number], DOB:  Apr 22, 1935, LOS: 4 ADMISSION DATE:  01/31/2019, CONSULTATION DATE: January 31, 2019 REFERRING MD: Lake Bells, CHIEF COMPLAINT: Dyspnea  Synopsis   84 year old female with a past medical history significant for stage Ia lung adenocarcinoma, breast cancer and rheumatoid arthritis first presented to the [facility] pulmonary clinic on January 27, 2019 for evaluation of increasing shortness of breath, generalized fatigue, weakness and nausea.  She was noted to have multiple pulmonary nodules, some mediastinal lymphadenopathy.  She says that over the several weeks after the CT scan was performed that showed these abnormalities she developed worsening fatigue, nausea, and shortness of breath.  A repeat CT scan was performed which showed new bilateral pleural effusions right greater than left.  A thoracentesis was performed on January 30, 2019.  However, despite this her shortness of breath worsens.  She presented for an elective bronchoscopy on January 31, 2019 but because of shortness of breath it was felt that this was not safe.  Further, final results from the thoracentesis were pending.  She notes poor p.o. intake, significant nausea and vomiting at home.  She has a lot of weakness.  Past Medical History  Rheumatoid arthritis Peripheral vascular disease Breast cancer Lung cancer Gastroesophageal reflux disease Hypertension  Significant Hospital Events    3/14 - on o2, cytology still pending. She says she has aged 30 year in 3 months. Daughters at bedside. Want her in hospital till all procecures done. Desatruated walking on room air. Says doctors baffled at her worsening  Consults:  3/16 Oncology  Procedures:  January 30, 2019 right-sided thoracentesis showed a lymphocytic predominant effusion, cytology preliminary report shows carcinoma, special stains pending  Significant Diagnostic Tests:  January 29, 2019 CT chest showed new right greater than left pleural  effusion, increasing nodules bilaterally worrisome for metastatic disease, mediastinal and hilar adenopathy stable if not decreased  Micro Data:  3/13 sputum culture > pending  Antimicrobials:  3/13 ceftriaxone > 3/16 3/13 azithro >   3/15  Interim history/subjective:   Feels about the same    Objective   Blood pressure 103/70, pulse 72, temperature 98.3 F (36.8 C), temperature source Oral, resp. rate (!) 22, height 5\' 1"  (1.549 m), weight 61.2 kg, SpO2 98 %.       No intake or output data in the 24 hours ending 02/04/19 1528 Filed Weights   01/31/19 1800 02/01/19 0155 02/03/19 0011  Weight: 61.2 kg 61 kg 61.2 kg    General:  Generally very weak, resting comfortably in bed HENT: NCAT OP clear PULM: Diminished bases B, normal effort CV: RRR, no mgr GI: BS+, soft, nontender MSK: normal bulk and tone Neuro: awake, alert, no distress, MAEW        LABS    PULMONARY No results for input(s): PHART, PCO2ART, PO2ART, HCO3, TCO2, O2SAT in the last 168 hours.  Invalid input(s): PCO2, PO2  CBC Recent Labs  Lab 02/01/19 0215 02/03/19 0242 02/04/19 0224  HGB 14.4 15.6* 15.5*  HCT 42.6 47.1* 45.4  WBC 9.7 9.7 10.2  PLT 217 232 270    COAGULATION Recent Labs  Lab 02/03/19 0242  INR 1.0    CARDIAC   Recent Labs  Lab 02/02/19 1659 02/03/19 0242 02/03/19 1831  TROPONINI 0.03* <0.03 <0.03   No results for input(s): PROBNP in the last 168 hours.   CHEMISTRY Recent Labs  Lab 01/31/19 1215 02/01/19 0215 02/03/19 0242 02/04/19 0224  NA 134* 133* 135  --  K 3.2* 3.3* 3.5  --   CL 96* 99 96*  --   CO2 26 25 30   --   GLUCOSE 116* 108* 93  --   BUN 10 6* 8  --   CREATININE 0.65 0.59 0.67  --   CALCIUM 9.4 8.6* 9.1  --   MG 2.0  --  1.8 2.1  PHOS 3.3  --  3.7 4.1   Estimated Creatinine Clearance: 46.3 mL/min (by C-G formula based on SCr of 0.67 mg/dL).   LIVER Recent Labs  Lab 01/31/19 1215 02/03/19 0242 02/04/19 0224  AST 24  --  23   ALT 21  --  23  ALKPHOS 71  --  72  BILITOT 1.5*  --  0.6  PROT 6.6  --  6.1*  ALBUMIN 3.7  --  3.0*  INR  --  1.0  --      INFECTIOUS Recent Labs  Lab 01/31/19 1215  PROCALCITON <0.10     ENDOCRINE CBG (last 3)  No results for input(s): GLUCAP in the last 72 hours.    Cytology> adenocarcinoma from 3/12 pleural fluid from left   IMAGING x48h  - image(s) personally visualized  -   highlighted in bold Dg Chest Port 1 View  Result Date: 02/02/2019 CLINICAL DATA:  Shortness of breath EXAM: PORTABLE CHEST 1 VIEW COMPARISON:  January 30, 2019. FINDINGS: There is consolidation in the left lower lobe. There are small pleural effusions bilaterally. Heart is borderline enlarged with pulmonary vascularity normal. No adenopathy. There is aortic atherosclerosis. Bones are osteoporotic. IMPRESSION: Left lower lobe airspace consolidation consistent with pneumonia. Small pleural effusions bilaterally. Stable cardiac prominence. Aortic Atherosclerosis (ICD10-I70.0). Electronically Signed   By: Lowella Grip III M.D.   On: 02/02/2019 16:17     Resolved Hospital Problem list     Assessment & Plan:  84 year old female with worsening shortness of breath, nausea, fatigue poor p.o. intake presented for an elective bronchoscopy on 3/14 but noted increased dyspnea and weakness.  Cytology report from R thoracentesis on 3/12 showed malignant cells.  Increasing pulmonary nodules with new pleural effusions - at admit - s/op r thora 01/30/2019 > metastatic adenocarcinoma> unclearr source  PLAN Needs outpatient PET then consideration of biopsy/treatment She is hesitant to consider a biopsy but I tried to explain that a PET may be able to identify an easily biopsied source Repeat CXR 2 view now, if pleural fluid increased then needs pleurex   Community-acquired pneumonia: resolved Stop antibiotics   Poor p.o. intake, nausea and vomiting:  Plan Stop fluids Advance diet  GERD: Famotidine   Rheumatoid arthritis: Hold home dose of orencia   Allergic rhinitis: Continue claritin  Hypertension: Continue bisoprolol, amlodipine, HCTZ, ARB  Gout  Continue home dose of colchicine  Deconditioning: > PT consult, needs SNF?  Acute respiratory failure with hypoxemia > home O2 evaluation > titrate O2 to maintain O2 saturation > 88%  Best practice:  Diet: regular diet Pain/Anxiety/Delirium protocol (if indicated): n/a VAP protocol (if indicated): n/a DVT prophylaxis: lovenox GI prophylaxis: famotidine Glucose control: monitor Mobility: up ad lib Code Status: full Family Communication: daughter updated bedside at length today Disposition: med surg    SIGNATURE   > 50% of this 25 min visit spent face to face  Roselie Awkward, MD [place] [facility] Pager: [phone] Cell: [phone] If no response, call [phone]   3:28 PM 02/04/2019

## 2019-02-04 NOTE — Progress Notes (Signed)
SATURATION QUALIFICATIONS: (This note is used to comply with regulatory documentation for home oxygen)  Patient Saturations on Room Air at Rest = 86%  Patient Saturations on Room Air while Ambulating = 83%  Patient Saturations on 3 Liters of oxygen while Ambulating = 92%  Please briefly explain why patient needs home oxygen: Pt requires supplemental oxygen for maintaining oxygen saturation during functional mobility.   Berneice Zettlemoyer B. Migdalia Dk PT, DPT Acute Rehabilitation Services Pager [phone] Office [phone]

## 2019-02-04 NOTE — Telephone Encounter (Signed)
ATC Patient. Left message to call back.

## 2019-02-04 NOTE — Care Management Important Message (Signed)
Important Message  Patient Details  Name: Karen Kaiser MRN: [id number] Date of Birth: [birth year]   Medicare Important Message Given:  Yes    [name] 02/04/2019, 10:02 AM

## 2019-02-05 ENCOUNTER — Telehealth: Payer: Self-pay | Admitting: Pulmonary Disease

## 2019-02-05 DIAGNOSIS — C3431 Malignant neoplasm of lower lobe, right bronchus or lung: Secondary | ICD-10-CM

## 2019-02-05 DIAGNOSIS — J910 Malignant pleural effusion: Secondary | ICD-10-CM

## 2019-02-05 DIAGNOSIS — R918 Other nonspecific abnormal finding of lung field: Secondary | ICD-10-CM

## 2019-02-05 LAB — CBC WITH DIFFERENTIAL/PLATELET
Abs Immature Granulocytes: 0.03 10*3/uL (ref 0.00–0.07)
Basophils Absolute: 0 10*3/uL (ref 0.0–0.1)
Basophils Relative: 0 %
Eosinophils Absolute: 0.2 10*3/uL (ref 0.0–0.5)
Eosinophils Relative: 3 %
HEMATOCRIT: 43.3 % (ref 36.0–46.0)
Hemoglobin: 15 g/dL (ref 12.0–15.0)
Immature Granulocytes: 0 %
Lymphocytes Relative: 26 %
Lymphs Abs: 2.5 10*3/uL (ref 0.7–4.0)
MCH: 33.1 pg (ref 26.0–34.0)
MCHC: 34.6 g/dL (ref 30.0–36.0)
MCV: 95.6 fL (ref 80.0–100.0)
Monocytes Absolute: 0.9 10*3/uL (ref 0.1–1.0)
Monocytes Relative: 9 %
Neutro Abs: 6 10*3/uL (ref 1.7–7.7)
Neutrophils Relative %: 62 %
Platelets: 245 10*3/uL (ref 150–400)
RBC: 4.53 MIL/uL (ref 3.87–5.11)
RDW: 12.3 % (ref 11.5–15.5)
WBC: 9.7 10*3/uL (ref 4.0–10.5)
nRBC: 0 % (ref 0.0–0.2)

## 2019-02-05 LAB — PHOSPHORUS: Phosphorus: 3.9 mg/dL (ref 2.5–4.6)

## 2019-02-05 LAB — MAGNESIUM: Magnesium: 2 mg/dL (ref 1.7–2.4)

## 2019-02-05 LAB — CEA: CEA: 103 ng/mL — ABNORMAL HIGH (ref 0.0–4.7)

## 2019-02-05 NOTE — Progress Notes (Signed)
NAME:  Karen Kaiser, MRN:  [id number], DOB:  [birth year], LOS: 5 ADMISSION DATE:  01/31/2019, CONSULTATION DATE: January 31, 2019 REFERRING MD: Lake Bells, CHIEF COMPLAINT: Dyspnea  Synopsis   84 year old female with a past medical history significant for stage Ia lung adenocarcinoma, breast cancer and rheumatoid arthritis first presented to the [facility] pulmonary clinic on January 27, 2019 for evaluation of increasing shortness of breath, generalized fatigue, weakness and nausea.  She was noted to have multiple pulmonary nodules, some mediastinal lymphadenopathy.  She says that over the several weeks after the CT scan was performed that showed these abnormalities she developed worsening fatigue, nausea, and shortness of breath.  A repeat CT scan was performed which showed new bilateral pleural effusions right greater than left.  A thoracentesis was performed on January 30, 2019.  However, despite this her shortness of breath worsens.  She presented for an elective bronchoscopy on January 31, 2019 but because of shortness of breath it was felt that this was not safe.  Further, final results from the thoracentesis were pending.  She notes poor p.o. intake, significant nausea and vomiting at home.  She has a lot of weakness.  Past Medical History  Rheumatoid arthritis Peripheral vascular disease Breast cancer Lung cancer Gastroesophageal reflux disease Hypertension  Significant Hospital Events    3/14 - on o2, cytology still pending. She says she has aged 29 year in 3 months. Daughters at bedside. Want her in hospital till all procecures done. Desatruated walking on room air. Says doctors baffled at her worsening  Consults:  3/16 Oncology  Procedures:  January 30, 2019 right-sided thoracentesis showed a lymphocytic predominant effusion, cytology preliminary report shows carcinoma, special stains pending  Significant Diagnostic Tests:  January 29, 2019 CT chest showed new right greater than left pleural  effusion, increasing nodules bilaterally worrisome for metastatic disease, mediastinal and hilar adenopathy stable if not decreased  Micro Data:  3/13 sputum culture > pending  Antimicrobials:  3/13 ceftriaxone > 3/16 3/13 azithro >   3/15  Interim history/subjective:   Feels about the same    Objective   Blood pressure (!) 160/86, pulse 80, temperature 97.6 F (36.4 C), temperature source Oral, resp. rate 17, height 5\' 1"  (1.549 m), weight 61.2 kg, SpO2 100 %.        Intake/Output Summary (Last 24 hours) at 02/05/2019 1024 Last data filed at 02/05/2019 2951 Gross per 24 hour  Intake 120 ml  Output 900 ml  Net -780 ml   Filed Weights   01/31/19 1800 02/01/19 0155 02/03/19 0011  Weight: 61.2 kg 61 kg 61.2 kg    General: Pleasant frail female in no acute distress at rest. HEENT: No JVD or lymphadenopathy is appreciated Neuro: Intact CV: s1s2 rrr, no m/r/g PULM: even/non-labored, lungs bilaterally managed in the bases with dull to percussion bilateral lower bases OA:CZYS, non-tender, bsx4 active  Extremities: warm/dry, negative edema  Skin: no rashes or lesions         LABS    PULMONARY No results for input(s): PHART, PCO2ART, PO2ART, HCO3, TCO2, O2SAT in the last 168 hours.  Invalid input(s): PCO2, PO2  CBC Recent Labs  Lab 02/03/19 0242 02/04/19 0224 02/05/19 0219  HGB 15.6* 15.5* 15.0  HCT 47.1* 45.4 43.3  WBC 9.7 10.2 9.7  PLT 232 270 245    COAGULATION Recent Labs  Lab 02/03/19 0242  INR 1.0    CARDIAC   Recent Labs  Lab 02/02/19 1659 02/03/19 0242 02/03/19 1831  TROPONINI  0.03* <0.03 <0.03   No results for input(s): PROBNP in the last 168 hours.   CHEMISTRY Recent Labs  Lab 01/31/19 1215 02/01/19 0215 02/03/19 0242 02/04/19 0224 02/05/19 0219  NA 134* 133* 135  --   --   K 3.2* 3.3* 3.5  --   --   CL 96* 99 96*  --   --   CO2 26 25 30   --   --   GLUCOSE 116* 108* 93  --   --   BUN 10 6* 8  --   --   CREATININE 0.65  0.59 0.67  --   --   CALCIUM 9.4 8.6* 9.1  --   --   MG 2.0  --  1.8 2.1 2.0  PHOS 3.3  --  3.7 4.1 3.9   Estimated Creatinine Clearance: 46.3 mL/min (by C-G formula based on SCr of 0.67 mg/dL).   LIVER Recent Labs  Lab 01/31/19 1215 02/03/19 0242 02/04/19 0224  AST 24  --  23  ALT 21  --  23  ALKPHOS 71  --  72  BILITOT 1.5*  --  0.6  PROT 6.6  --  6.1*  ALBUMIN 3.7  --  3.0*  INR  --  1.0  --      INFECTIOUS Recent Labs  Lab 01/31/19 1215  PROCALCITON <0.10     ENDOCRINE CBG (last 3)  No results for input(s): GLUCAP in the last 72 hours.    Cytology> adenocarcinoma from 3/12 pleural fluid from left   IMAGING x48h  - image(s) personally visualized  -   highlighted in bold Dg Chest 2 View  Result Date: 02/04/2019 CLINICAL DATA:  Acute respiratory failure with hypoxemia, LEFT pleural effusion, history breast cancer, GERD, hypertension, lung cancer EXAM: CHEST - 2 VIEW COMPARISON:  02/02/2019 FINDINGS: Enlargement of cardiac silhouette. Mediastinal contours and pulmonary vascularity normal. Atherosclerotic calcification aorta. Emphysematous changes with bibasilar pleural effusions and atelectasis. Scattered interstitial infiltrates throughout RIGHT lung. Suspected coexistent consolidation in the LEFT lower lobe as noted previously. No pneumothorax. Bones diffusely demineralized. IMPRESSION: Changes of COPD with bibasilar pleural effusions and atelectasis. Scattered interstitial infiltrates and LEFT lower lobe consolidation question pneumonia. Compared to the previous exam, slightly increased RIGHT pleural effusion and basilar atelectasis. Electronically Signed   By: Lavonia Dana M.D.   On: 02/04/2019 17:25   02/05/2019 chest x-ray shows no significant increase in pleural effusion.  Resolved Hospital Problem list     Assessment & Plan:  84 year old female with worsening shortness of breath, nausea, fatigue poor p.o. intake presented for an elective bronchoscopy on 3/14  but noted increased dyspnea and weakness.  Cytology report from R thoracentesis on 3/12 showed malignant cells.  Increasing pulmonary nodules with new pleural effusions - at admit - s/op r thora 01/30/2019 > metastatic adenocarcinoma> unclearr source  PLAN She will need outpatient PET scan with possible biopsy/treatment She is been hesitant to consider a biopsy.  This will be addressed by Dr. Lake Bells an outpatient Repeat chest x-ray on 02/04/2019 did not reveal any significant increase in pleural effusion.  Therefore Pleurx catheter is not needed at this time.  Further considerations could be given to Pleurx catheter in the future.  Hopefully she will be discharged in next 24 to 48 hours to hold.   Community-acquired pneumonia: resolved Off antibiotics   Poor p.o. intake, nausea and vomiting:  Plan Advance diet as tolerated  GERD: Famotidine  Rheumatoid arthritis: Holding home dose of Orencia  Allergic rhinitis: Continue home Claritin  Hypertension: Continue her home medication Risperdal amiodarone hydrochlorothiazide and ARB  Gout  Continue home colchicine  Deconditioning: > PT is following.  She does not want to go to a SNF facility at this time due to mandatory isolation secondary to the ongoing coronavirus.  We will try to make arrangements to get her home.  Acute respiratory failure with hypoxemia > She will need home O2 to keep O2 saturations greater than 88%.  It is documented on 02/04/2019 with her O2 saturations were 86% on room air at rest and 83% with ambulation.   Best practice:  Diet: regular diet Pain/Anxiety/Delirium protocol (if indicated): n/a VAP protocol (if indicated): n/a DVT prophylaxis: lovenox GI prophylaxis: famotidine Glucose control: monitor Mobility: Currently having occupational and physical therapy Code Status: full Family Communication: Patient daughter and husband updated at bedside with with healthcare power of attorney on phone  during conversation. Disposition: med surg    SIGNATURE   02/05/2019 extensive time spent with patient) and daughter answering questions.  She does not appear ready to go home at this time.  We will continue to follow make arrangements for home health, home O2 along with follow-up with Dr. Lake Bells as an outpatient.  Richardson Landry  ACNP [facility] Pager [phone] till 1 pm If no answer page [phone] 02/05/2019, 10:27 AM

## 2019-02-05 NOTE — Telephone Encounter (Signed)
BQ  patient - please order PET scan Plan is for discharge from the hospital 3/19

## 2019-02-05 NOTE — Progress Notes (Signed)
Physical Therapy Treatment Patient Details Name: Karen Kaiser MRN: [id number] DOB: 1935-08-01 Today's Date: 02/05/2019    History of Present Illness 84 year old female with a past medical history significant for stage Ia lung adenocarcinoma, breast cancer and rheumatoid arthritis first presented to the [facility] pulmonary clinic on January 27, 2019 for evaluation of increasing shortness of breath, generalized fatigue, weakness and nausea. January 29, 2019 CT chest showed new right greater than left pleural effusion, increasing nodules bilaterally worrisome for metastatic disease, mediastinal and hilar adenopathy stable if not decreased. January 30, 2019 right-sided thoracentesis showed a lymphocytic predominant effusion, cytology preliminary report shows carcinoma.    PT Comments    Pt up in recliner with family present upon arrival and agreed to participate with therapy. Pt reported having a lot of concerns about going home and managing at home because she previously handled everything. Family ensured her that they will be helping her. Pt progressed in ambulation distance, decreased assistance and stair training. Pt had no episodes of unsteadiness or LOB during treatment session. Pt educated on use of RW if she feels more comfortable using a device for maintaining her balance. Pt also provided a gait belt for use at home to help ease anxiety about falling. Pt on 3L O2 by nasal cannula throughout session, pts o2 dropped to 82% after stairs but immediately went back up to above 90% after finishing last step. Spo2 stayed above 95% rest of session. Pt educated on current recommendation for home health PT in order to keep progressing her functional mobility and safety at home.    Follow Up Recommendations  Home health PT;Supervision/Assistance - 24 hour     Equipment Recommendations  None recommended by PT    Recommendations for Other Services       Precautions / Restrictions Precautions Precautions:  None Precaution Comments: 3L nasal cannula  Restrictions Weight Bearing Restrictions: No    Mobility  Bed Mobility               General bed mobility comments: sitting in recliner upon arrival  Transfers Overall transfer level: Needs assistance Equipment used: Rolling walker (2 wheeled) Transfers: Sit to/from Stand Sit to Stand: Min guard         General transfer comment: min guard for safety   Ambulation/Gait Ambulation/Gait assistance: Min guard Gait Distance (Feet): 150 Feet Assistive device: Rolling walker (2 wheeled);None Gait Pattern/deviations: Step-through pattern;Decreased stride length Gait velocity: decreased   General Gait Details: pt initially ambulated a few week with walker, pt reported preferring to use hand held assist for ambulation, pt ambulated to/from stairwell with no AD with min guard assist, pt reported feeling unsteady but no unsteadiness or LOB noted   Stairs Stairs: Yes Stairs assistance: Min assist Stair Management: One rail Left;Forwards;Step to pattern Number of Stairs: 5 General stair comments: pt ascended/descended 5 stairs with one hand rail and one hand held assist since at home she can reach both handrails, pt had no episodes of unsteadiness or LOB during stair training, pt educated on "up with the good down with the bad technique", pt spo2 dropped to 82% after stairs but immediately went back up to above 90% after stopping, pt encouraged to take deep breaths and make sure she breaths during activity    Wheelchair Mobility    Modified Rankin (Stroke Patients Only)       Balance Overall balance assessment: Needs assistance           Standing balance-Leahy Scale: Fair Standing balance  comment: minguard for standing and ambulation                            Cognition Arousal/Alertness: Awake/alert Behavior During Therapy: WFL for tasks assessed/performed Overall Cognitive Status: Within Functional Limits for  tasks assessed                                        Exercises      General Comments        Pertinent Vitals/Pain Pain Assessment: No/denies pain    Home Living                      Prior Function            PT Goals (current goals can now be found in the care plan section) Progress towards PT goals: Progressing toward goals    Frequency    Min 3X/week      PT Plan Current plan remains appropriate    Co-evaluation              AM-PAC PT "6 Clicks" Mobility   Outcome Measure  Help needed turning from your back to your side while in a flat bed without using bedrails?: A Little Help needed moving from lying on your back to sitting on the side of a flat bed without using bedrails?: A Little Help needed moving to and from a bed to a chair (including a wheelchair)?: None Help needed standing up from a chair using your arms (e.g., wheelchair or bedside chair)?: None Help needed to walk in hospital room?: A Little Help needed climbing 3-5 steps with a railing? : A Little 6 Click Score: 20    End of Session Equipment Utilized During Treatment: Gait belt;Oxygen Activity Tolerance: Patient tolerated treatment well Patient left: in bed;with family/visitor present(sitting EOB) Nurse Communication: Mobility status PT Visit Diagnosis: Unsteadiness on feet (R26.81);Other abnormalities of gait and mobility (R26.89);Muscle weakness (generalized) (M62.81);Difficulty in walking, not elsewhere classified (R26.2)     Time: 4967-5916 PT Time Calculation (min) (ACUTE ONLY): 20 min  Charges:  $Gait Training: 8-22 mins                     [place], [place] [phone]    Karen Kaiser 02/05/2019, 4:38 PM

## 2019-02-05 NOTE — Telephone Encounter (Signed)
Order has been placed.

## 2019-02-05 NOTE — Telephone Encounter (Signed)
Per patient's chart, she has been in the hospital since 3/13. Will close this encounter.

## 2019-02-05 NOTE — TOC Progression Note (Signed)
Transition of Care [facility]) - Progression Note    Patient Details  Name: Karen Kaiser MRN: [id number] Date of Birth: 09-Aug-1935  [facility] CM/SW Contact  Jacalyn Lefevre Edson Snowball, RN Phone Number: 02/05/2019, 3:51 PM  Clinical Narrative:     Received order for home health RN, will also need order for HHPT.   Patient and family have not decided on home health agency will follow up in morning.  Expected Discharge Plan: [place] Barriers to Discharge: No Barriers Identified  Expected Discharge Plan and Services Expected Discharge Plan: [place] Discharge Planning Services: CM Consult   Living arrangements for the past 2 months: Single Family Home                 DME Arranged: 3-N-1, Oxygen DME Agency: [facility] HH Arranged: PT     Social Determinants of Health (SDOH) Interventions    Readmission Risk Interventions 30 Day Unplanned Readmission Risk Score     Admission (Current) from 01/31/2019 in [place]  30 Day Unplanned Readmission Risk Score (%)  15 Filed at 02/05/2019 1200     This score is the patient's risk of an unplanned readmission within 30 days of being discharged (0 -100%). The score is based on dignosis, age, lab data, medications, orders, and past utilization.   Low:  0-14.9   Medium: 15-21.9   High: 22-29.9   Extreme: 30 and above       No flowsheet data found.

## 2019-02-05 NOTE — TOC Initial Note (Addendum)
Transition of Care [facility]) - Initial/Assessment Note    Patient Details  Name: Karen Kaiser MRN: [id number] Date of Birth: 01/03/1935  Transition of Care [facility]) CM/SW Contact:    Marilu Favre, RN Phone Number: 02/05/2019, 8:29 AM  Clinical Narrative:                   Expected Discharge Plan: [place] Barriers to Discharge: No Barriers Identified 1140 Spoke to patient, husband , and daughter in room. They are waiting to speak to patient's other daughter regarding home health agency selection. Will need home health orders and face to face Spoke to patient at bedside. Confirmed face sheet information. Patient from home with husband. She has two daughters who are going to take turns staying with her after discharge.   Patient has PCP, can have prescriptions filled.   Patient already has walker, bedside commode and shower chair at home. Patient will need home oxygen. Patient voices understanding.   Provided Medicare.gov list of home health agencies. Patient will discuss home health agency with family when they visit this morning.  Will need orders for home oxygen and home health orders and face to face. Patient Goals and CMS Choice Patient states their goals for this hospitalization and ongoing recovery are:: to go home  CMS Medicare.gov Compare Post Acute Care list provided to:: Patient Choice offered to / list presented to : Patient  Expected Discharge Plan and Services Expected Discharge Plan: [place] Discharge Planning Services: CM Consult   Living arrangements for the past 2 months: Single Family Home                 DME Arranged: 3-N-1, Oxygen DME Agency: [facility] HH Arranged: PT    Prior Living Arrangements/Services Living arrangements for the past 2 months: Single Family Home Lives with:: Spouse Patient language and need for interpreter reviewed:: No Do you feel safe going back to the place where you live?: Yes       Need for Family Participation in Patient Care: Yes (Comment) Care giver support system in place?: Yes (comment) Current home services: DME Criminal Activity/Legal Involvement Pertinent to Current Situation/Hospitalization: No - Comment as needed  Activities of Daily Living Home Assistive Devices/Equipment: Eyeglasses ADL Screening (condition at time of admission) Patient's cognitive ability adequate to safely complete daily activities?: Yes Is the patient deaf or have difficulty hearing?: No Does the patient have difficulty seeing, even when wearing glasses/contacts?: No Does the patient have difficulty concentrating, remembering, or making decisions?: No Patient able to express need for assistance with ADLs?: Yes Does the patient have difficulty dressing or bathing?: No Independently performs ADLs?: Yes (appropriate for developmental age) Does the patient have difficulty walking or climbing stairs?: No Weakness of Legs: Both Weakness of Arms/Hands: None  Permission Sought/Granted                  Emotional Assessment Appearance:: Appears stated age Attitude/Demeanor/Rapport: Gracious Affect (typically observed): Accepting Orientation: : Oriented to Self, Oriented to Place, Oriented to  Time, Oriented to Situation   Psych Involvement: No (comment)  Admission diagnosis:  nodules Patient Active Problem List   Diagnosis Date Noted  . CAP (community acquired pneumonia) 01/31/2019  . Rheumatoid arthritis ([place]) 04/28/2014  . History of breast cancer 05/01/2012  . Leukocytosis 05/01/2012  . Adenocarcinoma of right lung ([place]) 05/01/2012  . Multiple joint pain 05/01/2012  . Postop Hypokalemia 02/22/2012  . Postop Hyponatremia 02/22/2012  .  OA (osteoarthritis) of knee 02/12/2012   PCP:  Lajean Manes, MD Pharmacy:   [id number] - [place], [place] [address] Phone: [phone] Fax: [phone]  [id number]  [place], [place] [address] [place] [address] Phone: [phone] Fax: [id number] - [place], [address] AT [facility] [address] [place] [address] Phone: [phone] Fax: [phone]     Social Determinants of Health ([place]) Interventions    Readmission Risk Interventions 30 Day Unplanned Readmission Risk Score     Admission (Current) from 01/31/2019 in [place]  30 Day Unplanned Readmission Risk Score (%)  15 Filed at 02/05/2019 0801     This score is the patient's risk of an unplanned readmission within 30 days of being discharged (0 -100%). The score is based on dignosis, age, lab data, medications, orders, and past utilization.   Low:  0-14.9   Medium: 15-21.9   High: 22-29.9   Extreme: 30 and above       No flowsheet data found.

## 2019-02-05 NOTE — Progress Notes (Signed)
Karen Kaiser   DOB:10/07/1935   WR#:[id number]   XBJ#:[id number]  Subjective:  Pretty exhausted this evening after OT and PT; cough worse early AM and evening; some soreness but not much pain; daughter in room   Objective: older White woman examined in bed Vitals:   02/05/19 0855 02/05/19 1740  BP: (!) 160/86 (!) 155/77  Pulse: 80 77  Resp:  18  Temp:  97.8 F (36.6 C)  SpO2:  100%    Body mass index is 25.49 kg/m.  Intake/Output Summary (Last 24 hours) at 02/05/2019 1823 Last data filed at 02/05/2019 1457 Gross per 24 hour  Intake 420 ml  Output 1000 ml  Net -580 ml     Lungs no rales or wheezes--auscultated anterolaterally  Heart regular rate and rhythm  Abdomen soft, +BS  Neuro nonfocal. A&O x3    CBG (last 3)  No results for input(s): GLUCAP in the last 72 hours.   Labs:  Lab Results  Component Value Date   WBC 9.7 02/05/2019   HGB 15.0 02/05/2019   HCT 43.3 02/05/2019   MCV 95.6 02/05/2019   PLT 245 02/05/2019   NEUTROABS 6.0 02/05/2019    @LASTCHEMISTRY @  Urine Studies No results for input(s): UHGB, CRYS in the last 72 hours.  Invalid input(s): UACOL, UAPR, USPG, UPH, UTP, UGL, UKET, UBIL, UNIT, UROB, ULEU, UEPI, UWBC, URBC, UBAC, CAST, [place], [place]  Basic Metabolic Panel: Recent Labs  Lab 01/31/19 1215 02/01/19 0215 02/03/19 0242 02/04/19 0224 02/05/19 0219  NA 134* 133* 135  --   --   K 3.2* 3.3* 3.5  --   --   CL 96* 99 96*  --   --   CO2 26 25 30   --   --   GLUCOSE 116* 108* 93  --   --   BUN 10 6* 8  --   --   CREATININE 0.65 0.59 0.67  --   --   CALCIUM 9.4 8.6* 9.1  --   --   MG 2.0  --  1.8 2.1 2.0  PHOS 3.3  --  3.7 4.1 3.9   GFR Estimated Creatinine Clearance: 46.3 mL/min (by C-G formula based on SCr of 0.67 mg/dL). Liver Function Tests: Recent Labs  Lab 01/31/19 1215 02/04/19 0224  AST 24 23  ALT 21 23  ALKPHOS 71 72  BILITOT 1.5* 0.6  PROT 6.6 6.1*  ALBUMIN 3.7 3.0*   No results for input(s): LIPASE, AMYLASE in  the last 168 hours. No results for input(s): AMMONIA in the last 168 hours. Coagulation profile Recent Labs  Lab 02/03/19 0242  INR 1.0    CBC: Recent Labs  Lab 01/31/19 1215 02/01/19 0215 02/03/19 0242 02/04/19 0224 02/05/19 0219  WBC 11.3* 9.7 9.7 10.2 9.7  NEUTROABS 8.9*  --  5.7 5.8 6.0  HGB 16.3* 14.4 15.6* 15.5* 15.0  HCT 46.3* 42.6 47.1* 45.4 43.3  MCV 95.1 95.9 96.7 96.6 95.6  PLT 244 217 232 270 245   Cardiac Enzymes: Recent Labs  Lab 02/02/19 1659 02/03/19 0242 02/03/19 1831  TROPONINI 0.03* <0.03 <0.03   BNP: Invalid input(s): POCBNP CBG: No results for input(s): GLUCAP in the last 168 hours. D-Dimer No results for input(s): DDIMER in the last 72 hours. Hgb A1c No results for input(s): HGBA1C in the last 72 hours. Lipid Profile No results for input(s): CHOL, HDL, LDLCALC, TRIG, CHOLHDL, LDLDIRECT in the last 72 hours. Thyroid function studies No results for input(s): TSH, T4TOTAL, T3FREE, THYROIDAB  in the last 72 hours.  Invalid input(s): FREET3 Anemia work up No results for input(s): VITAMINB12, FOLATE, FERRITIN, TIBC, IRON, RETICCTPCT in the last 72 hours. Microbiology Recent Results (from the past 240 hour(s))  Gram stain     Status: None   Collection Time: 01/30/19  1:10 PM  Result Value Ref Range Status   Specimen Description PLEURAL RIGHT  Final   Special Requests NONE  Final   Gram Stain   Final    NO WBC SEEN NO ORGANISMS SEEN Performed at [facility], [address], [place], [place] [address]    Report Status 01/31/2019 FINAL  Final  Fungus Culture With Stain     Status: None (Preliminary result)   Collection Time: 01/30/19  1:10 PM  Result Value Ref Range Status   Fungus Stain Final report  Final    Comment: (NOTE) Performed At: [facility] [place], [place] [id number] Rush Farmer MD OE:[id number]    Fungus (Mycology) Culture PENDING  Incomplete   Fungal Source PLEURAL  Final    Comment:  RIGHT Performed at [facility], [place] [address] [place], [place] [address]   Acid Fast Smear (AFB)     Status: None   Collection Time: 01/30/19  1:10 PM  Result Value Ref Range Status   AFB Specimen Processing Concentration  Final   Acid Fast Smear Negative  Final    Comment: (NOTE) Performed At: [facility] [place], [place] [id number] Rush Farmer MD YB:[id number]    Source (AFB) PLEURAL  Final    Comment: RIGHT Performed at [facility], [place] [address] [place], [place] [address]   Culture, body fluid-bottle     Status: None   Collection Time: 01/30/19  1:10 PM  Result Value Ref Range Status   Specimen Description PLEURAL RIGHT  Final   Special Requests NONE  Final   Culture   Final    NO GROWTH 5 DAYS Performed at [facility] [address] [place], [place] [address]    Report Status 02/04/2019 FINAL  Final  Fungus Culture Result     Status: None   Collection Time: 01/30/19  1:10 PM  Result Value Ref Range Status   Result 1 Comment  Final    Comment: (NOTE) KOH/Calcofluor preparation:  no fungus observed. Performed At: [facility] [place], [place] [id number] Rush Farmer MD GQ:[id number]       Studies:  Dg Chest 2 View  Result Date: 02/04/2019 CLINICAL DATA:  Acute respiratory failure with hypoxemia, LEFT pleural effusion, history breast cancer, GERD, hypertension, lung cancer EXAM: CHEST - 2 VIEW COMPARISON:  02/02/2019 FINDINGS: Enlargement of cardiac silhouette. Mediastinal contours and pulmonary vascularity normal. Atherosclerotic calcification aorta. Emphysematous changes with bibasilar pleural effusions and atelectasis. Scattered interstitial infiltrates throughout RIGHT lung. Suspected coexistent consolidation in the LEFT lower lobe as noted previously. No pneumothorax. Bones diffusely demineralized. IMPRESSION: Changes of COPD with bibasilar pleural effusions and atelectasis. Scattered  interstitial infiltrates and LEFT lower lobe consolidation question pneumonia. Compared to the previous exam, slightly increased RIGHT pleural effusion and basilar atelectasis. Electronically Signed   By: Lavonia Dana M.D.   On: 02/04/2019 17:25    Assessment: 84 y.o.[place] woman status post:  (1) Right lower lobectomy April 2004 for a 1.9-cm lung adenocarcinoma, with no lymph node involvement, negative margins, and no evidence of recurrence so far.  (2) Status post right mastectomy in 1998 for ductal  carcinoma in situ.             (a) left ductectomy February 2005 for what proved to be benign changes.  (3) small bilateral pulmonary nodules noted on chest CT scan 04/05/2016, below level of resolution by PET scan 04/24/2016              (a) repeat chest CT 10/17/2016  stable             (b) chest CT scan 10/24/2017 again stable (8-10 nodules per lung, all less than 5 mm, all unchanged)             (c) CT chest 10/30/2018 shows again multiple pulmonary nodules, now slightly larger  (4) chest CT scan on 01/28/2019 for symptomatic SOB shows new small to moderate likely loculated bilateral pleural effusions, new and increasing bilateral pulmonary nodules and interlobular septal thickening, and new small sclerotic bony lesions, compatible with increasing metastatic disease.             (a) cytology from Right  thoracentesis 01/30/2019 confirms adenocarcinoma, most c/w a lung primary  (b) Foundation One requested 02/05/2019  (c) considering tyrosine kinase inhibitor is an option based on genomics results   Plan:  I had a long discussion with Talisha and her daughter (who is HCPOA). They understand stage IV lung cancer is not curable. Until recently the only available treatment was chemotherapy, but in the last few years targeted agents have become available--they are better tolerated and more effective than chemotherapy. To find out if her tumor would be susceptible will require genomics testing  and that may take up to 2 weeks.  In the meantime she will be home and will have a PET likely next week.   If chemotherapy is the only option my recommendation is for Hospice and Romey is in agreement. I wrote her an out of facility DNR she can keep at home in case things rapidly deteriorate. She already has a living will in place.   If she is a candidate for a TKI we will obtain that for her through out pharmacy. In any case she will see me at the Clinic on 03/27 and we will make a final decision at that time.  I greatly appreciate your help to this patient and her family!     Chauncey Cruel, MD 02/05/2019  6:23 PM Medical Oncology and Hematology [facility] [address] [place], [place] [address] Tel. [phone]    Fax. [phone]

## 2019-02-05 NOTE — Progress Notes (Signed)
Path shows adenoCA. I have req uested a Foundation 1 (or if insufficient tissue and EGFR and ALK). Should have results in a week. Will discuss w pt later today

## 2019-02-05 NOTE — Progress Notes (Signed)
Occupational Therapy Evaluation Patient Details Name: Karen Kaiser MRN: [id number] DOB: 04/01/1935 Today's Date: 02/05/2019    History of Present Illness 84 year old female with a past medical history significant for stage Ia lung adenocarcinoma, breast cancer and rheumatoid arthritis first presented to the [facility] pulmonary clinic on January 27, 2019 for evaluation of increasing shortness of breath, generalized fatigue, weakness and nausea. January 29, 2019 CT chest showed new right greater than left pleural effusion, increasing nodules bilaterally worrisome for metastatic disease, mediastinal and hilar adenopathy stable if not decreased. January 30, 2019 right-sided thoracentesis showed a lymphocytic predominant effusion, cytology preliminary report shows carcinoma.   Clinical Impression   PTA, pt was living at home with her husband, and required some assistance with ADL/IADL and was independent with functional mobility. Pt's daughter and husband present during session. Pt currently on 4lnc throughout session maintained SpO2 >97%. Pt required minguard with ADL, completed grooming while standing at sink level for 55min. Pt required minguard for functional mobility, demonstrating good awareness and O2 line management. Educated pt/family on energy conservation strategies with provided handout. Due to decline in current level of function, pt would benefit from acute OT to address established goals to facilitate safe D/C to venue listed below. At this time, recommend HHOT follow-up. Will continue to follow acutely.     Follow Up Recommendations  Home health OT;Supervision/Assistance - 24 hour    Equipment Recommendations  Other (comment)(oxygen)    Recommendations for Other Services PT consult     Precautions / Restrictions Precautions Precaution Comments: oxygen required [place]      Mobility Bed Mobility               General bed mobility comments: sitting EOB on arrival unsupported    Transfers Overall transfer level: Needs assistance Equipment used: Rolling walker (2 wheeled) Transfers: Sit to/from Stand Sit to Stand: Min guard              Balance Overall balance assessment: Needs assistance Sitting-balance support: Feet supported;No upper extremity supported Sitting balance-Leahy Scale: Good     Standing balance support: No upper extremity supported;During functional activity Standing balance-Leahy Scale: Fair Standing balance comment: minguard for standing                           ADL either performed or assessed with clinical judgement   ADL Overall ADL's : Needs assistance/impaired Eating/Feeding: Set up   Grooming: Standing;Min guard Grooming Details (indicate cue type and reason): minguard for stability;pt brushed teeth and washed face VSS  Upper Body Bathing: Min guard   Lower Body Bathing: Moderate assistance   Upper Body Dressing : Set up   Lower Body Dressing: Moderate assistance   Toilet Transfer: Min guard;Ambulation Toilet Transfer Details (indicate cue type and reason): pt ambulated from EOB to toilet          Functional mobility during ADLs: Min guard General ADL Comments: SpO2 maintained >97% throughout session;pt completed ADL standing for about 12min     Vision Baseline Vision/History: Wears glasses Wears Glasses: At all times       Perception     Praxis      Pertinent Vitals/Pain       Hand Dominance Right   Extremity/Trunk Assessment         Cervical / Trunk Assessment Cervical / Trunk Assessment: Kyphotic   Communication Communication Communication: No difficulties   Cognition Arousal/Alertness: Awake/alert Behavior During Therapy: WFL for tasks assessed/performed  Overall Cognitive Status: Within Functional Limits for tasks assessed                                     General Comments  Pt's daughter and husband present during session;educated family/pt on energy  conservation strategies with provided handout;pt educated on propper SpO2 line management, pt demonstrated good awareness and management of O2 line    Exercises     Shoulder Instructions      Home Living Family/patient expects to be discharged to:: Private residence Living Arrangements: Spouse/significant other Available Help at Discharge: Available 24 hours/day;Family Type of Home: House Home Access: Stairs to enter [employer] of Steps: 5 Entrance Stairs-Rails: Can reach both [place]: One level     Bathroom Shower/Tub: Occupational psychologist: Handicapped height Bathroom Accessibility: Yes   Home Equipment: Grab bars - tub/shower;Shower seat;Walker - 2 wheels;Bedside commode   Additional Comments: daughter is POA- second daughter (A)       Prior Functioning/Environment Level of Independence: Independent    ADL's / Homemaking Assistance Needed: sleeps in recliner   Comments: had given up housework to husband        OT Problem List: Decreased range of motion;Decreased activity tolerance;Impaired balance (sitting and/or standing);Decreased cognition;Decreased safety awareness;Decreased knowledge of use of DME or AE;Cardiopulmonary status limiting activity;Pain      OT Treatment/Interventions: Self-care/ADL training;Therapeutic exercise;Energy conservation;DME and/or AE instruction;Therapeutic activities;Patient/family education;Balance training    OT Goals(Current goals can be found in the care plan section) Acute Rehab OT Goals Patient Stated Goal: to get her strength back OT Goal Formulation: With patient Time For Goal Achievement: 02/19/19 Potential to Achieve Goals: Good ADL Goals Pt Will Perform Grooming: with modified independence;standing;sitting Pt Will Perform Upper Body Dressing: with modified independence Pt Will Transfer to Toilet: with modified independence;ambulating Additional ADL Goal #1: Pt will be independent with 3 energy  conservation strategies during ADL completion.  OT Frequency: Min 2X/week   Barriers to D/C:            Co-evaluation              AM-PAC OT "6 Clicks" Daily Activity     Outcome Measure Help from another person eating meals?: None Help from another person taking care of personal grooming?: A Little Help from another person toileting, which includes using toliet, bedpan, or urinal?: A Little Help from another person bathing (including washing, rinsing, drying)?: A Little Help from another person to put on and taking off regular upper body clothing?: A Little Help from another person to put on and taking off regular lower body clothing?: A Lot 6 Click Score: 18   End of Session Equipment Utilized During Treatment: Gait belt;Oxygen Nurse Communication: Mobility status  Activity Tolerance: Patient limited by fatigue Patient left: in chair;with call bell/phone within reach;with family/visitor present  OT Visit Diagnosis: Unsteadiness on feet (R26.81);Other abnormalities of gait and mobility (R26.89);Muscle weakness (generalized) (M62.81);Pain Pain - part of body: (unspecified )                Time: 1018-1100 OT Time Calculation (min): 42 min Charges:  OT General Charges $OT Visit: 1 Visit OT Evaluation $OT Eval Moderate Complexity: 1 Mod OT Treatments $Self Care/Home Management : 23-37 mins  Dorinda Hill OTR/L Acute Rehabilitation Services Office: [place] 02/05/2019, 11:11 AM

## 2019-02-06 ENCOUNTER — Inpatient Hospital Stay (HOSPITAL_COMMUNITY): Payer: Medicare Other

## 2019-02-06 ENCOUNTER — Telehealth: Payer: Self-pay | Admitting: Pulmonary Disease

## 2019-02-06 DIAGNOSIS — J9601 Acute respiratory failure with hypoxia: Secondary | ICD-10-CM

## 2019-02-06 LAB — CBC WITH DIFFERENTIAL/PLATELET
Abs Immature Granulocytes: 0.02 10*3/uL (ref 0.00–0.07)
BASOS PCT: 0 %
Basophils Absolute: 0 10*3/uL (ref 0.0–0.1)
Eosinophils Absolute: 0.2 10*3/uL (ref 0.0–0.5)
Eosinophils Relative: 2 %
HCT: 41.2 % (ref 36.0–46.0)
Hemoglobin: 14.2 g/dL (ref 12.0–15.0)
Immature Granulocytes: 0 %
Lymphocytes Relative: 31 %
Lymphs Abs: 2.8 10*3/uL (ref 0.7–4.0)
MCH: 33.5 pg (ref 26.0–34.0)
MCHC: 34.5 g/dL (ref 30.0–36.0)
MCV: 97.2 fL (ref 80.0–100.0)
Monocytes Absolute: 0.8 10*3/uL (ref 0.1–1.0)
Monocytes Relative: 9 %
Neutro Abs: 5.1 10*3/uL (ref 1.7–7.7)
Neutrophils Relative %: 58 %
PLATELETS: 259 10*3/uL (ref 150–400)
RBC: 4.24 MIL/uL (ref 3.87–5.11)
RDW: 12.3 % (ref 11.5–15.5)
WBC: 9 10*3/uL (ref 4.0–10.5)
nRBC: 0 % (ref 0.0–0.2)

## 2019-02-06 LAB — MAGNESIUM: Magnesium: 1.9 mg/dL (ref 1.7–2.4)

## 2019-02-06 LAB — PHOSPHORUS: Phosphorus: 3.7 mg/dL (ref 2.5–4.6)

## 2019-02-06 MED ORDER — ONDANSETRON HCL 4 MG PO TABS: 4.0000 mg | ORAL_TABLET | Freq: Four times a day (QID) | ORAL | 1 refills | Status: AC | PRN

## 2019-02-06 NOTE — Progress Notes (Signed)
Pt discharged home with daughter - Pt w/c to front of building with staff.

## 2019-02-06 NOTE — Telephone Encounter (Signed)
Patient's daughter pharmacy is [facility].  Hospital only had Zofran in injection form.  Patient would like pill form.  Phone number is [phone].

## 2019-02-06 NOTE — Care Management (Cosign Needed)
    Durable Medical Equipment  (From admission, onward)         Start     Ordered   02/06/19 1220  For home use only DME Hospital bed  Once    Question Answer Comment  Patient has (list medical condition): Increasing pulmonary nodules with new pleural effusions   The above medical condition requires: Patient requires the ability to reposition immediately   Head must be elevated greater than: 45 degrees   Bed type Semi-electric      02/06/19 1220   02/05/19 1249  For home use only DME oxygen  Once    Question Answer Comment  Mode or (Route) Nasal cannula   Liters per Minute 3   Frequency Continuous (stationary and portable oxygen unit needed)   Oxygen conserving device Yes   Oxygen delivery system Gas      02/05/19 1250

## 2019-02-06 NOTE — Telephone Encounter (Signed)
Called and spoke with patients daughter, she is aware and verbalized understanding. Nothing further needed.

## 2019-02-06 NOTE — Discharge Summary (Signed)
Physician Discharge Summary  Patient ID: Karen Kaiser MRN: [id number] DOB/AGE: Jun 26, 1935 84 y.o.  Admit date: 01/31/2019 Discharge date: 02/06/2019  Problem List Active Problems:   CAP (community acquired pneumonia)  HPI: 84 year old female with a past medical history significant for stage Ia lung adenocarcinoma, breast cancer and rheumatoid arthritis first presented to the [facility] pulmonary clinic on January 27, 2019 for evaluation of increasing shortness of breath, generalized fatigue, weakness and nausea.  She was noted to have multiple pulmonary nodules, some mediastinal lymphadenopathy.  She says that over the several weeks after the CT scan was performed that showed these abnormalities she developed worsening fatigue, nausea, and shortness of breath.  A repeat CT scan was performed which showed new bilateral pleural effusions right greater than left.  A thoracentesis was performed on January 30, 2019.  However, despite this her shortness of breath worsens.  She presented for an elective bronchoscopy on January 31, 2019 but because of shortness of breath it was felt that this was not safe.  Further, final results from the thoracentesis were pending.  She notes poor p.o. intake, significant nausea and vomiting at home.  She has a lot of weakness.  Past Medical History  Rheumatoid arthritis Peripheral vascular disease Breast cancer Lung cancer Gastroesophageal reflux disease Hypertension   Hospital Course:  Procedures:  January 30, 2019 right-sided thoracentesis showed a lymphocytic predominant effusion, cytology preliminary report shows carcinoma, special stains pending  Significant Diagnostic Tests:  January 29, 2019 CT chest showed new right greater than left pleural effusion, increasing nodules bilaterally worrisome for metastatic disease, mediastinal and hilar adenopathy stable if not decreased  Micro Data:  3/13 sputum culture > pending  Antimicrobials:  3/13 ceftriaxone >  3/16 3/13 azithro >   3/15  Interim history/subjective:   Feels about the same    Assessment & Plan:  84 year old female with worsening shortness of breath, nausea, fatigue poor p.o. intake presented for an elective bronchoscopy on 3/14 but noted increased dyspnea and weakness.  Cytology report from R thoracentesis on 3/12 showed malignant cells.  Increasing pulmonary nodules with new pleural effusions - at admit - s/op r thora 01/30/2019 > metastatic adenocarcinoma> unclearr source  PLAN She will need outpatient PET scan with possible biopsy/treatment She is been hesitant to consider a biopsy.  This will be addressed by Dr. Lake Bells an outpatient Repeat chest x-ray on 02/04/2019 did not reveal any significant increase in pleural effusion.  Therefore Pleurx catheter is not needed at this time.  Further considerations could be given to Pleurx catheter in the future.  Hopefully she will be discharged in next 24 to 48 hours to hold.   Community-acquired pneumonia: resolved Off antibiotics   Poor p.o. intake, nausea and vomiting:  Plan Advance diet as tolerated  GERD: Famotidine  Rheumatoid arthritis: Holding home dose of Orencia   Allergic rhinitis: Continue home Claritin  Hypertension: Continue her home medication Risperdal amiodarone hydrochlorothiazide and ARB  Gout  Continue home colchicine  Deconditioning: > PT is following.  She does not want to go to a SNF facility at this time due to mandatory isolation secondary to the ongoing coronavirus.  We will try to make arrangements to get her home.  Acute respiratory failure with hypoxemia > She will need home O2 to keep O2 saturations greater than 88%.  It is documented on 02/04/2019 with her O2 saturations were 86% on room air at rest and 83% with ambulation. She has been discharged home with home palliative care on  02/06/2019.  She has a follow-up appointment with Eric Form acnp.  She needs an  outpatient PET scan.  She will need to follow-up with Dr. Jana Hakim and Dr. Lake Bells in the future.  General: Female no acute distress at rest HEENT: No JVD or lymphadenopathy is appreciated Neuro: Grossly intact with some memory loss  CV: s1s2 rrr, no m/r/g PULM: even/non-labored, lungs bilaterally diminished in the bases with dull to percussion at bases LG:XQJJ, non-tender, bsx4 active  Extremities: warm/dry, negative edema  Skin: no rashes or lesions  Best practice:  Diet: regular diet Pain/Anxiety/Delirium protocol (if indicated): n/a VAP protocol (if indicated): n/a DVT prophylaxis: lovenox GI prophylaxis: famotidine Glucose control: monitor Mobility: Currently having occupational and physical therapy Code Status: full Family Communication: Patient daughter and husband updated at bedside with with healthcare power of attorney on phone during conversation. Disposition: med surg      Labs at discharge Lab Results  Component Value Date   CREATININE 0.67 02/03/2019   BUN 8 02/03/2019   NA 135 02/03/2019   K 3.5 02/03/2019   CL 96 (L) 02/03/2019   CO2 30 02/03/2019   Lab Results  Component Value Date   WBC 9.0 02/06/2019   HGB 14.2 02/06/2019   HCT 41.2 02/06/2019   MCV 97.2 02/06/2019   PLT 259 02/06/2019   Lab Results  Component Value Date   ALT 23 02/04/2019   AST 23 02/04/2019   ALKPHOS 72 02/04/2019   BILITOT 0.6 02/04/2019   Lab Results  Component Value Date   INR 1.0 02/03/2019   INR 1.13 03/09/2016   INR 1.00 02/02/2012    Current radiology studies Dg Chest 2 View  Result Date: 02/06/2019 CLINICAL DATA:  History of lung cancer.  Cough, shortness of breath. EXAM: CHEST - 2 VIEW COMPARISON:  February 04, 2019. FINDINGS: The heart size and mediastinal contours are within normal limits. Stable bilateral pleural effusions are identified. Consolidation of bilateral lung bases are unchanged. Right apical pleural thickening is unchanged. The visualized  skeletal structures are stable. IMPRESSION: Stable bilateral pleural effusions are identified. Consolidation of bilateral lung bases unchanged, at least in part due to atelectasis but underlying pneumonia is are not excluded. Electronically Signed   By: Abelardo Diesel M.D.   On: 02/06/2019 08:30   Dg Chest 2 View  Result Date: 02/04/2019 CLINICAL DATA:  Acute respiratory failure with hypoxemia, LEFT pleural effusion, history breast cancer, GERD, hypertension, lung cancer EXAM: CHEST - 2 VIEW COMPARISON:  02/02/2019 FINDINGS: Enlargement of cardiac silhouette. Mediastinal contours and pulmonary vascularity normal. Atherosclerotic calcification aorta. Emphysematous changes with bibasilar pleural effusions and atelectasis. Scattered interstitial infiltrates throughout RIGHT lung. Suspected coexistent consolidation in the LEFT lower lobe as noted previously. No pneumothorax. Bones diffusely demineralized. IMPRESSION: Changes of COPD with bibasilar pleural effusions and atelectasis. Scattered interstitial infiltrates and LEFT lower lobe consolidation question pneumonia. Compared to the previous exam, slightly increased RIGHT pleural effusion and basilar atelectasis. Electronically Signed   By: Lavonia Dana M.D.   On: 02/04/2019 17:25    Intake/Output Summary (Last 24 hours) at 02/06/2019 1130 Last data filed at 02/06/2019 0659 Gross per 24 hour  Intake -  Output 800 ml  Net -800 ml   Filed Weights   01/31/19 1800 02/01/19 0155 02/03/19 0011  Weight: 61.2 kg 61 kg 61.2 kg    Disposition:  Discharge disposition: 01-Home or Self Care        Allergies as of 02/06/2019      Reactions  Amlodipine Besy-benazepril Hcl Swelling   10mg  or higher?? Per patient   Latex Other (See Comments)   Blister/itching/swelling   Rhopressa [netarsudil Dimesylate] Other (See Comments)   Eye redness   Simponi Aria [golimumab]    Pt. Developed pneumonia with the second dose   Xalatan [latanoprost] Itching    Plaquenil [hydroxychloroquine Sulfate] Itching, Rash      Medication List    STOP taking these medications   ORENCIA IV     TAKE these medications   acetaminophen 500 MG tablet Commonly known as:  TYLENOL Take 1,000 mg by mouth 2 (two) times daily.   amLODipine 2.5 MG tablet Commonly known as:  NORVASC Take 2.5 mg by mouth every evening.   bisoprolol 10 MG tablet Commonly known as:  ZEBETA Take 10 mg by mouth daily before breakfast.   brimonidine 0.1 % Soln Commonly known as:  ALPHAGAN P Place 1 drop into both eyes 2 (two) times daily.   CALCIUM + D3 PO Take 1 tablet by mouth 3 (three) times daily.   cholecalciferol 25 MCG (1000 UT) tablet Commonly known as:  VITAMIN D Take 1,000 Units by mouth every Monday, Tuesday, Wednesday, Thursday, and Friday.   colchicine 0.6 MG tablet Take 0.6 mg by mouth every other day.   dorzolamide-timolol 22.3-6.8 MG/ML ophthalmic solution Commonly known as:  COSOPT Place 1 drop into both eyes 2 (two) times daily.   famotidine 20 MG tablet Commonly known as:  PEPCID Take 20 mg by mouth at bedtime.   hydrochlorothiazide 12.5 MG capsule Commonly known as:  MICROZIDE Take 12.5 mg by mouth daily.   loratadine 10 MG tablet Commonly known as:  CLARITIN Take 10 mg by mouth daily.   multivitamin with minerals Tabs tablet Take 1 tablet by mouth daily with lunch. Centrum Silver   olmesartan 20 MG tablet Commonly known as:  BENICAR Take 20 mg by mouth daily.            Durable Medical Equipment  (From admission, onward)         Start     Ordered   02/05/19 1249  For home use only DME oxygen  Once    Question Answer Comment  Mode or (Route) Nasal cannula   Liters per Minute 3   Frequency Continuous (stationary and portable oxygen unit needed)   Oxygen conserving device Yes   Oxygen delivery system Gas      02/05/19 1250            Discharged Condition: fair  Time spent on discharge greater than 40  minutes.  Vital signs at Discharge. Temp:  [97.8 F (36.6 C)-98.4 F (36.9 C)] 98.2 F (36.8 C) (03/19 0700) Pulse Rate:  [77-78] 77 (03/19 0835) Resp:  [16-18] 16 (03/19 0700) BP: (118-187)/(77-97) 187/94 (03/19 1100) SpO2:  [97 %-100 %] 97 % (03/19 0700) Office follow up Special Information or instructions. She has a follow-up appointment scheduled with Eric Form ACNP on March 30 at 0900 hrs. she is a patient of Dr. Roselie Awkward.  She is also followed by Dr. Jana Hakim.  She had a recent thoracentesis showed adeno cells.  She is to be set up with PET scan in the office.   Signed: Richardson Landry Kerrin Markman ACNP Maryanna Shape PCCM Pager [phone] till 1 pm If no answer page [phone] 02/06/2019, 11:25 AM

## 2019-02-06 NOTE — Telephone Encounter (Signed)
Okay to send an order:  Zofran 4 mg tablet >>> Can take 1 4 mg tablet every 6 hours as needed for nausea >>> Prescription of 45 tablets >>> 1 refill  Please place the order  Wyn Quaker, FNP

## 2019-02-06 NOTE — TOC Progression Note (Signed)
Transition of Care [facility]) - Progression Note    Patient Details  Name: AVIRA TILLISON MRN: [id number] Date of Birth: 08/05/1935  Transition [facility] CM/SW Contact  Jacalyn Lefevre Edson Snowball, RN Phone Number: 02/06/2019, 12:30 PM  Clinical Narrative:      Patient and daughter at bedside.   Requesting hospital bed . Same ordered along with oxygen through [facility].   They have picked [facility] for home health, referral given to and accepted by [place] with [facility].    They would like Palliative Care through [facility] care, referral called to [facility]  Expected Discharge Plan: [place] Barriers to Discharge: No Barriers Identified  Expected Discharge Plan and Services Expected Discharge Plan: [place]   Discharge Planning Services: CM Consult   Living arrangements for the past 2 months: Single Family Home Expected Discharge Date: 02/06/19               DME Arranged: 3-N-1, Oxygen DME Agency: [facility] HH Arranged: PT     Social Determinants of Health (SDOH) Interventions    Readmission Risk Interventions No flowsheet data found.

## 2019-02-06 NOTE — Telephone Encounter (Signed)
Spoke to pt daughter Corporate investment banker) and she stated pt was being discharged and wanted zofran call in to pharmacy.  Instructed daughter to ask at hospital also but told her I would send a message to app of the day.   Pt in for pleural effusion, acute respiratory failure with hypoxia with thoracentesis performed on 01/31/19.  She is a pt of Dr Lake Bells.  Pt is experiencing n/v and poor appetite.  Aaron Edelman, please advise about zofran?

## 2019-02-06 NOTE — Telephone Encounter (Signed)
thanks

## 2019-02-06 NOTE — Addendum Note (Signed)
Addended by: Valerie Salts on: 02/06/2019 04:07 PM   Modules accepted: Orders

## 2019-02-07 ENCOUNTER — Telehealth: Payer: Self-pay

## 2019-02-07 NOTE — Telephone Encounter (Signed)
Received message that patient's daughter had called to follow up with request for palliative care s/p hospitalization. Dr. Carlyle Lipa office called. Message left for [place], Psychologist, sport and exercise, to make MD aware and ensure that he is in agreement. Call back information left.

## 2019-02-07 NOTE — Progress Notes (Signed)
OT NOTE- LATE entry   02/05/19 1000  OT Visit Information  Last OT Received On 02/05/19  Assistance Needed +1  History of Present Illness 84 year old female with a past medical history significant for stage Ia lung adenocarcinoma, breast cancer and rheumatoid arthritis first presented to the [facility] pulmonary clinic on January 27, 2019 for evaluation of increasing shortness of breath, generalized fatigue, weakness and nausea. January 29, 2019 CT chest showed new right greater than left pleural effusion, increasing nodules bilaterally worrisome for metastatic disease, mediastinal and hilar adenopathy stable if not decreased. January 30, 2019 right-sided thoracentesis showed a lymphocytic predominant effusion, cytology preliminary report shows carcinoma.  Precautions  Precaution Comments oxygen required [place]  Home Living  Family/patient expects to be discharged to: Private residence  Living Arrangements Spouse/significant other  Available Help at Discharge Available 24 hours/day;Family  Type of [place] to enter  Entrance Stairs-Number of Steps 5  Entrance Stairs-Rails Can reach both  [place] One level  Bathroom Shower/Tub Walk-in shower  [place] bars - tub/shower;Shower seat;Walker - 2 wheels;BSC  Additional Comments daughter is POA- second daughter (A)   Prior Function  Level of Independence Independent  ADL's / Homemaking Assistance Needed sleeps in recliner  Comments had given up housework to husband  Engineer, petroleum No difficulties  Cognition  Arousal/Alertness Awake/alert  Behavior During Therapy WFL for tasks assessed/performed  Overall Cognitive Status Within Functional Limits for tasks assessed  Cervical / Trunk Assessment  Cervical / Trunk Assessment Kyphotic  ADL  Overall ADL's  Needs assistance/impaired  Eating/Feeding Set up  Grooming Standing;Min guard  Grooming  Details (indicate cue type and reason) minguard for stability;pt brushed teeth and washed face VSS   Upper Body Bathing Min guard  Lower Body Bathing Moderate assistance  Upper Body Dressing  Set up  Lower Body Dressing Moderate assistance  Toilet Transfer Min guard;Ambulation  Toilet Transfer Details (indicate cue type and reason) pt ambulated from EOB to toilet   Functional mobility during ADLs Min guard  General ADL Comments SpO2 maintained >97% throughout session;pt completed ADL standing for about 40min  Vision- History  Baseline Vision/History Wears glasses  Wears Glasses At all times  Bed Mobility  General bed mobility comments sitting EOB on arrival unsupported   Transfers  Overall transfer level Needs assistance  Equipment used Rolling walker (2 wheeled)  Transfers Sit to/from Stand  Sit to Stand Min guard  Balance  Overall balance assessment Needs assistance  Sitting-balance support Feet supported;No upper extremity supported  Sitting balance-Leahy Scale Good  Standing balance support No upper extremity supported;During functional activity  Standing balance-Leahy Scale Fair  Standing balance comment minguard for standing  General Comments  General comments (skin integrity, edema, etc.) Pt's daughter and husband present during session;educated family/pt on energy conservation strategies with provided handout;pt educated on propper SpO2 line management, pt demonstrated good awareness and management of O2 line  OT - End of Session  Equipment Utilized During Treatment Gait belt;Oxygen  Activity Tolerance Patient limited by fatigue  Patient left in chair;with call bell/phone within reach;with family/visitor present  Nurse Communication Mobility status  OT Assessment  OT Recommendation/Assessment Patient needs continued OT Services  OT Visit Diagnosis Unsteadiness on feet (R26.81);Other abnormalities of gait and mobility (R26.89);Muscle weakness (generalized) (M62.81);Pain   Pain - part of body  (unspecified )  OT Problem List Decreased range of motion;Decreased activity tolerance;Impaired balance (sitting and/or standing);Decreased  cognition;Decreased safety awareness;Decreased knowledge of use of DME or AE;Cardiopulmonary status limiting activity;Pain  OT Plan  OT Frequency (ACUTE ONLY) Min 2X/week  OT Treatment/Interventions (ACUTE ONLY) Self-care/ADL training;Therapeutic exercise;Energy conservation;DME and/or AE instruction;Therapeutic activities;Patient/family education;Balance training  AM-PAC OT "6 Clicks" Daily Activity Outcome Measure (Version 2)  Help from another person eating meals? 4  Help from another person taking care of personal grooming? 3  Help from another person toileting, which includes using toliet, bedpan, or urinal? 3  Help from another person bathing (including washing, rinsing, drying)? 3  Help from another person to put on and taking off regular upper body clothing? 3  Help from another person to put on and taking off regular lower body clothing? 2  6 Click Score 18  OT Recommendation  Recommendations for Other Services PT consult  Follow Up Recommendations Home health OT;Supervision/Assistance - 24 hour  OT Equipment Other (comment) (oxygen)  Individuals Consulted  Consulted and Agree with Results and Recommendations Patient;Family member/caregiver  Family Member Consulted pts daughter and husband  Acute Rehab OT Goals  Patient Stated Goal to get her strength back  OT Goal Formulation With patient  Time For Goal Achievement 02/19/19  Potential to Achieve Goals Good  OT Time Calculation  OT Start Time (ACUTE ONLY) 1018  OT Stop Time (ACUTE ONLY) 1100  OT Time Calculation (min) 42 min  OT General Charges  $OT Visit 1 Visit  OT Evaluation  $OT Eval Moderate Complexity 1 Mod  OT Treatments  $Self Care/Home Management  23-37 mins  Written Expression  Dominant Hand Right    Jeri Modena   OTR/L Pager: [phone] Office:  [phone] .

## 2019-02-08 DIAGNOSIS — M069 Rheumatoid arthritis, unspecified: Secondary | ICD-10-CM | POA: Diagnosis not present

## 2019-02-08 DIAGNOSIS — K219 Gastro-esophageal reflux disease without esophagitis: Secondary | ICD-10-CM | POA: Diagnosis not present

## 2019-02-08 DIAGNOSIS — Z9981 Dependence on supplemental oxygen: Secondary | ICD-10-CM | POA: Diagnosis not present

## 2019-02-08 DIAGNOSIS — J910 Malignant pleural effusion: Secondary | ICD-10-CM | POA: Diagnosis not present

## 2019-02-08 DIAGNOSIS — C50919 Malignant neoplasm of unspecified site of unspecified female breast: Secondary | ICD-10-CM | POA: Diagnosis not present

## 2019-02-08 DIAGNOSIS — C7800 Secondary malignant neoplasm of unspecified lung: Secondary | ICD-10-CM | POA: Diagnosis not present

## 2019-02-08 DIAGNOSIS — I10 Essential (primary) hypertension: Secondary | ICD-10-CM | POA: Diagnosis not present

## 2019-02-08 DIAGNOSIS — J9601 Acute respiratory failure with hypoxia: Secondary | ICD-10-CM | POA: Diagnosis not present

## 2019-02-08 DIAGNOSIS — I700 Atherosclerosis of aorta: Secondary | ICD-10-CM | POA: Diagnosis not present

## 2019-02-08 DIAGNOSIS — I739 Peripheral vascular disease, unspecified: Secondary | ICD-10-CM | POA: Diagnosis not present

## 2019-02-08 DIAGNOSIS — Z9181 History of falling: Secondary | ICD-10-CM | POA: Diagnosis not present

## 2019-02-10 ENCOUNTER — Telehealth: Payer: Self-pay

## 2019-02-10 ENCOUNTER — Telehealth: Payer: Self-pay | Admitting: Licensed Clinical Social Worker

## 2019-02-10 DIAGNOSIS — Z7189 Other specified counseling: Secondary | ICD-10-CM | POA: Diagnosis not present

## 2019-02-10 DIAGNOSIS — J9601 Acute respiratory failure with hypoxia: Secondary | ICD-10-CM | POA: Diagnosis not present

## 2019-02-10 DIAGNOSIS — C799 Secondary malignant neoplasm of unspecified site: Secondary | ICD-10-CM | POA: Diagnosis not present

## 2019-02-10 NOTE — Telephone Encounter (Signed)
Received voicemail from [place] with Dr. Carlyle Lipa office. Dr. Felipa Eth is in agreement with patient receiving Palliative services.

## 2019-02-10 NOTE — Telephone Encounter (Signed)
Palliative Care SW phoned patient's daughter, Joseph Art, and scheduled a home visit at 1pm on Thursday, 3/26.

## 2019-02-12 DIAGNOSIS — I700 Atherosclerosis of aorta: Secondary | ICD-10-CM | POA: Diagnosis not present

## 2019-02-12 DIAGNOSIS — J9601 Acute respiratory failure with hypoxia: Secondary | ICD-10-CM | POA: Diagnosis not present

## 2019-02-12 DIAGNOSIS — C7800 Secondary malignant neoplasm of unspecified lung: Secondary | ICD-10-CM | POA: Diagnosis not present

## 2019-02-12 DIAGNOSIS — C50919 Malignant neoplasm of unspecified site of unspecified female breast: Secondary | ICD-10-CM | POA: Diagnosis not present

## 2019-02-12 DIAGNOSIS — J910 Malignant pleural effusion: Secondary | ICD-10-CM | POA: Diagnosis not present

## 2019-02-12 DIAGNOSIS — I10 Essential (primary) hypertension: Secondary | ICD-10-CM | POA: Diagnosis not present

## 2019-02-13 ENCOUNTER — Telehealth: Payer: Self-pay

## 2019-02-13 ENCOUNTER — Other Ambulatory Visit: Payer: Self-pay

## 2019-02-13 ENCOUNTER — Other Ambulatory Visit: Payer: Self-pay | Admitting: Oncology

## 2019-02-13 ENCOUNTER — Other Ambulatory Visit: Payer: Medicare Other | Admitting: *Deleted

## 2019-02-13 ENCOUNTER — Other Ambulatory Visit: Payer: Medicare Other | Admitting: Licensed Clinical Social Worker

## 2019-02-13 DIAGNOSIS — Z515 Encounter for palliative care: Secondary | ICD-10-CM

## 2019-02-13 NOTE — Telephone Encounter (Signed)
Patient's daughter called following up on PDL-1 that was sent off.    Nurse spoke with [facility] Pathology, results still pending.  Should be final in the next 2-3 business days.    Nurse informed daughter, voiced understanding.  Per Dr. Jana Hakim - once results are final, all coordination will be over telephone.  Daughter aware, voiced appreciation.

## 2019-02-14 DIAGNOSIS — C50919 Malignant neoplasm of unspecified site of unspecified female breast: Secondary | ICD-10-CM | POA: Diagnosis not present

## 2019-02-14 DIAGNOSIS — C7800 Secondary malignant neoplasm of unspecified lung: Secondary | ICD-10-CM | POA: Diagnosis not present

## 2019-02-14 DIAGNOSIS — J9601 Acute respiratory failure with hypoxia: Secondary | ICD-10-CM | POA: Diagnosis not present

## 2019-02-14 DIAGNOSIS — J910 Malignant pleural effusion: Secondary | ICD-10-CM | POA: Diagnosis not present

## 2019-02-14 DIAGNOSIS — I10 Essential (primary) hypertension: Secondary | ICD-10-CM | POA: Diagnosis not present

## 2019-02-14 DIAGNOSIS — I700 Atherosclerosis of aorta: Secondary | ICD-10-CM | POA: Diagnosis not present

## 2019-02-15 NOTE — Progress Notes (Signed)
COMMUNITY PALLIATIVE CARE SW NOTE  PATIENT NAME: Karen Kaiser DOB: 03/19/1935 MRN: [id number]  PRIMARY CARE PROVIDER: Lajean Manes, MD  RESPONSIBLE PARTY:  Acct ID - Guarantor Home Phone Work Phone Relationship Acct Type  [id number] Antony [phone]  Self P/F     [place], [place] [address]     PLAN OF CARE and INTERVENTIONS:             1. GOALS OF CARE/ ADVANCE CARE PLANNING:  Patient's goals are to remain in her home with her husband, Mortimer Fries, and no further hospitalizations.  She has a DNR. 2. SOCIAL/EMOTIONAL/SPIRITUAL ASSESSMENT/ INTERVENTIONS:  SW and Palliative Care RN, Daryl Eastern, met with patient, her husband, Mortimer Fries, and her two daughters, Joseph Art and Margreta Journey.  Patient sat in her recliner with her O2 on.  Her hospital bed is currently in the living room.  Patient expressed discomfort at times which is controlled by medication.  She has been married over 34 years.  Mortimer Fries has some cognitive deficits as expressed by himself and patient.  Patient has several grandchildren and great grandchildren who are supportive.  Patient's greatest adjustment is her inability to care for others as she is accustomed to.  She is currently receiving physical therapy.  Patient expressed her Darrick Meigs faith.  Joseph Art stated she feels the family will require additional counseling in the future. 3. PATIENT/CAREGIVER EDUCATION/ COPING:  Provided education regarding the Palliative Care program and Hospice.  Patient and her family cope by expressing their feelings openly. 4. PERSONAL EMERGENCY PLAN:  Patient's daughters rotate staying with her and contact medical personnel as necessary. 5. COMMUNITY RESOURCES COORDINATION/ HEALTH CARE NAVIGATION:  None. 6. FINANCIAL/LEGAL CONCERNS/INTERVENTIONS:  None.     SOCIAL HX:  Social History   Tobacco Use  . Smoking status: Never Smoker  . Smokeless tobacco: Never Used  Substance Use Topics  . Alcohol use: No    CODE STATUS:   DNR  ADVANCED DIRECTIVES: N MOST FORM COMPLETE:  N HOSPICE EDUCATION PROVIDED:  Y PPS:  Patient reports food does not taste as good as it used to.  Her appetite is normal per her family.  She is able to stand and transfer independently, but her daughter's walk beside her.  She also has a walker she uses at times. Duration of visit and documentation:  75 minutes.      Creola Corn Pearley Baranek, LCSW

## 2019-02-17 ENCOUNTER — Ambulatory Visit (INDEPENDENT_AMBULATORY_CARE_PROVIDER_SITE_OTHER): Payer: Medicare Other | Admitting: Acute Care

## 2019-02-17 ENCOUNTER — Other Ambulatory Visit: Payer: Self-pay

## 2019-02-17 ENCOUNTER — Telehealth: Payer: Self-pay | Admitting: *Deleted

## 2019-02-17 ENCOUNTER — Encounter: Payer: Self-pay | Admitting: Acute Care

## 2019-02-17 DIAGNOSIS — Z9889 Other specified postprocedural states: Secondary | ICD-10-CM

## 2019-02-17 DIAGNOSIS — C569 Malignant neoplasm of unspecified ovary: Secondary | ICD-10-CM

## 2019-02-17 NOTE — Progress Notes (Signed)
Virtual Visit via Telephone Note  I connected with Karen Kaiser on 02/17/19 at  9:00 AM EDT by telephone and verified that I am speaking with the correct person using two identifiers.   I discussed the limitations, risks, security and privacy concerns of performing an evaluation and management service by telephone and the availability of in person appointments. I also discussed with the patient that there may be a patient responsible charge related to this service. The patient expressed understanding and agreed to proceed.  I confirmed patient date of birth and address as identifiers . Patient's daughter is conveying information for the patient as she is deconditioned. Her daughter is her HCPOA.   Synopsis (1)Right lower lobectomy April 2004 for a 1.9-cm lung adenocarcinoma, with no lymph node involvement, negative margins, and no evidence of recurrence so far.  (2)Status post right mastectomy in 1998 for ductal carcinoma in situ. (a)left ductectomy February 2005 for whatproved to be benign changes.  (3)small bilateral pulmonary nodules noted onchestCT scan05/17/2017, below level of resolution by PET scan06/03/2016  (a) repeat chest CT 10/17/2016 stable (b) chest CT scan 10/24/2017 again stable (8-10 nodules per lung, all less than 5 mm, all unchanged) (c) CT chest 10/30/2018 shows again multiple pulmonary nodules, now slightly larger  (4) chestCT scan on 3/10/2020for symptomatic SOB showsnew small to moderate likely loculated bilateral pleural effusions, new and increasing bilateral pulmonary nodules and interlobular septal thickening, and new small sclerotic bony lesions, compatible with increasing metastatic disease. (a) cytology from Rightthoracentesis 01/30/2019 confirms adenocarcinoma, most c/w a lung primary             (b) Foundation One requested 02/05/2019             (c) considering tyrosine kinase  inhibitor is an option based on genomics results    History of Present Illness: 84 year old female with a past medical history significant for stage Ia lung adenocarcinoma, breast cancer and rheumatoid arthritis first presented to the [facility] pulmonary clinic on January 27, 2019 for evaluation of increasing shortness of breath, generalized fatigue, weakness and nausea.  CT chest was done on 01/28/2019 which showed new bilateral pleural effusions right greater than left. She was noted to have multiple pulmonary nodules, some mediastinal lymphadenopathy. A thoracentesis was performed on January 30, 2019. Pleural fluid was sent for cytology and was positive for MALIGNANT CELLS CONSISTENT WITH ADENOCARCINOMA. CEA done 3/17 was 103. Patient was admitted to the hospital on 01/31/2019 for CAP , bilateral pleural effusions and  failure to thrive, she had complaints of poor intake and nausea and vomiting. As a result she had become very weak, and deconditioned. She was treated with ceftriaxone 3/13-3/16 and Azithromycin from 3/13-3/15, both treatments completed as inpatient. She was dischahrged with oxygen 3L [place]. This is a hospital follow up tele visit.   Procedures:  January 30, 2019 right-sided thoracentesis showed a lymphocytic predominant effusion, cytology preliminary report shows carcinoma, special stains pending  Significant Diagnostic Tests:  January 29, 2019 CT chest showed new right greater than left pleural effusion, increasing nodules bilaterally worrisome for metastatic disease, mediastinal and hilar adenopathy stable if not decreased  Micro Data:  3/12>> Pleural Fluid  culture > negative for AFB or Fungus 3/12>> Gram Stain Pleural Fluid > Negative  Antimicrobials:  ceftriaxone 3/13>> 3/16 azithro 3/13>>3/15  Interim history/subjective:   States she is less dyspneic with oxygen. ( 3 L [place]) States she does not feel she needs thoracentesis or Pleurex at present. Daughter confirmed  saturations are  96% at rest and about 90% with exertion Palliative care intake was done 3/26. They will see the patient once monthly unless there are other questions or concerns. PT is coming to the house twice weekly. She is working hard to do what they ask.   Observations/Objective:  Hospital Follow up Admission 3/13-3/19/2020 with CAP and bilateral pleural effusions Pt. States she is doing well. She feels her dyspnea is baseline, much better since she now has oxygen .  Her daughter Karen Kaiser is with her during the visit and is assisting in communication..  Pt. remains very short of breath with exertion. She is coughing up thick frothy mucus. Worse in the morning and at night. Some episodes throughout the day.She is wearing her oxygen at 3 L [place]. Saturations are greater than 88%. Her daughter states when she is resting her sats are 96%, and with activity they do drop to 90%.. Her daughter feels she is coughing up more than she had been previously. She states her breathing is much better on oxygen.Last antibiotics were in the hospital. The mucus is white. She denies any fever.She states that her shortness of breath is not bad enough for her to consider a Pleurex catheter, or even CXR for thoracentesis.We reviewed the results of her pleural fluid analysis. These were consistent with adenocarcinoma. She was seen by Dr. Jana Hakim her oncologist on 02/05/2019 prior to discharge. He discussed with the patient and her daughter Karen Kaiser) that stage IV cancer is not curable. He discussed with them the use of targeted agents , as they are better tolerated and more effective than chemotherapy if she has some specific genetic markers..She had genomic testing requested 02/05/2019 [facility]). If results are positive, she is hoping to have tyrosine kinase inhibitor therapy to slow tumor growth. She will then agree to PET scan as a baseline for therapy and treatment.  Therefore she is awaiting genomic  testing results  Before making decision about PET scan. We discussed need to call for fever or worsening shortness of breath.    Assessment and Plan: Stable with oxygen use ( 3 L [place]) Does not feel she needs CXR. Thoracentesis, or Pleur-ex. At present States she knows how she felt before the last procedure, and she feels better now. Continue oxygen at 3 L [place] Check for fever, call if you develop fever, change in color of secretions , or amount of secretions. Call the office for worsening shortness of breath, or drops in oxygen saturations. Oxygen saturation goal is > 88%. Continue Respiradol, Amiodarone, Hydrochlorothiazide, colchicine  and ARB as you have been doing. May need to consider CXR for  further evaluation of effusions/ pulmonary vascular congestion if she develops more dyspnea   Follow Up Instructions: Follow up appointment 04/21/2019 at 2 pm with Judson Roch NP Please call anytime if you need [place] sooner.   I discussed the assessment and treatment plan with the patient. The patient was provided an opportunity to ask questions and all were answered. The patient agreed with the plan and demonstrated an understanding of the instructions.   The patient was advised to call back or seek an in-person evaluation if the symptoms worsen or if the condition fails to improve as anticipated.  I provided  30 minutes of non-face-to-face time during this encounter.   Magdalen Spatz, NP 02/17/2019 11:36 AM

## 2019-02-17 NOTE — Telephone Encounter (Signed)
This RN spoke with pt's daughter Margreta Journey -per her call regarding increased symptoms in pt - nausea , vomiting .  Christina states nausea occurs " with exertion- when she is moving around " Vomiting occurs with prolonged coughing.  Pt has been using Zofran for nausea ( which has been on ongoing symptom) but with less benefit presently.  Per MD review recommended prednisone mg Q am.  Above discussed with Margreta Journey - who states pt has a prescription in home from her Rheumatologist for 5 mg ( to use if she developed a rheumatalgic pain episode ) Margreta Journey verbalized understanding of daily am dose of 5 mg with food. Pt is also currently on famotidine.  Margreta Journey also inquired about Foundation 1 results - if they have resulted.  This RN verified with path lab result is not available yet.

## 2019-02-18 NOTE — Progress Notes (Addendum)
COMMUNITY PALLIATIVE CARE RN NOTE  PATIENT NAME: Karen Kaiser DOB: 1934-12-30 MRN: [id number]  PRIMARY CARE PROVIDER: Lajean Manes, MD  RESPONSIBLE PARTY:  Acct ID - Guarantor Home Phone Work Phone Relationship Acct Type  [id number] Antony Odea502 [phone]  Self P/F     [place], [place], [place] [address]    PLAN OF CARE and INTERVENTION:  1. ADVANCE CARE PLANNING/GOALS OF CARE: Goal is to remain at home with her husband. She is a DNR. 2. PATIENT/CAREGIVER EDUCATION: Explained Palliative Care Services, Safe Mobility, Pain Management and Energy Conservation 3. DISEASE STATUS: Joint visit made with Palliative Care SW, Lynn Duffy. Met with patient, husband, Mortimer Fries, and 2 daughters, Joseph Art and Margreta Journey, in the home. Patient initially sitting at the dining table eating lunch and came into the living room for visit. She reports generalized pain and joint pain from her RA. She used to receive a monthly infusion to help with her RA, but she will no longer receive this. She spoke about her recurrent lung CA and her concerns regarding her diagnosis. She received a thoracentesis recently and family is awaiting results from the fluid they obtained. She is O2 dependent and is currently at 3L/min via [place]. She does experience some coughing and dyspnea during conversation. Cough is productive. Phlegm is thick, white and frothy. She is utilizing her Incentive Spirometer, but does not like it because she feels her lung performance should be better. She does have some issues with nausea and is taking Zofran tablets to help. She is ambulatory without assistive devices, but someone does stand by to hold the oxygen cord. She requires assistance with bathing and dressing d/t dyspnea and fatigue. She is currently working with Physical Therapy, which she states has helped. Her intake is slowly improving, but has to eat several small meals per day. She states food does not taste good. Patient had several  questions regarding hospice and what they provide, and feels that she will be welcoming/accepting when the time comes. She states that she is tired most of the time and just wants to sleep. She has a hospital bed, shower chair, cane and walker for DME. Will continue to monitor.   HISTORY OF PRESENT ILLNESS:  This is a 84 yo female who resides at home with her husband. Her 2 daughters take turns being with patient throughout the day/night. She had a recent hospitalization from 01/31/19 to 02/16/19 due to worsening sob, weakness, nausea and poor intake. She also had pleural effusions. Palliative Care asked to follow patient for added monitoring and support. Will visit monthly and PRN.  CODE STATUS: DNR ADVANCED DIRECTIVES: Y  MOST FORM: no PPS: 50%   PHYSICAL EXAM:   LUNGS: Mild dyspnea noted with exertion and during conversation, O2 dependent EXTREMITIES: No edema SKIN: Exposed skin is dry and intact; denies any skin issues  NEURO: Alert and oriented x 3, pleasant mood, generalized weakness, ambulatory    (Duration of visit and documentation 75 minutes)   Daryl Eastern, RN BSN

## 2019-02-19 ENCOUNTER — Other Ambulatory Visit: Payer: Self-pay | Admitting: Oncology

## 2019-02-19 ENCOUNTER — Other Ambulatory Visit: Payer: Self-pay

## 2019-02-19 ENCOUNTER — Telehealth: Payer: Self-pay

## 2019-02-19 DIAGNOSIS — J9601 Acute respiratory failure with hypoxia: Secondary | ICD-10-CM | POA: Diagnosis not present

## 2019-02-19 DIAGNOSIS — I700 Atherosclerosis of aorta: Secondary | ICD-10-CM | POA: Diagnosis not present

## 2019-02-19 DIAGNOSIS — C7800 Secondary malignant neoplasm of unspecified lung: Secondary | ICD-10-CM | POA: Diagnosis not present

## 2019-02-19 DIAGNOSIS — J910 Malignant pleural effusion: Secondary | ICD-10-CM | POA: Diagnosis not present

## 2019-02-19 DIAGNOSIS — I10 Essential (primary) hypertension: Secondary | ICD-10-CM | POA: Diagnosis not present

## 2019-02-19 DIAGNOSIS — C50919 Malignant neoplasm of unspecified site of unspecified female breast: Secondary | ICD-10-CM | POA: Diagnosis not present

## 2019-02-19 MED ORDER — DEXAMETHASONE 4 MG PO TABS: 4.0000 mg | ORAL_TABLET | Freq: Two times a day (BID) | ORAL | 1 refills | Status: AC

## 2019-02-19 MED ORDER — PROMETHAZINE HCL 25 MG RE SUPP: 25.0000 mg | Freq: Four times a day (QID) | RECTAL | 0 refills | Status: AC | PRN

## 2019-02-19 NOTE — Telephone Encounter (Signed)
Spoke with pt's dtr, Karen Kaiser, who reports that prednisone has not really helped mom's nausea.  She also says pt c/o head pain in posterior part of head - not a headache and no neck pain.  Pt also reports significant change in vision since last discharged from hospital.  Dtr also asked about Foundation 1 results. Per Dr Jana Hakim pt to start dexamethasone 4mg  BID for head pain and phenergan supp for nausea.  Prescriptions sent to pharmacy of dtr's choice. Informed dtr that Foundation 1 results are not yet back and she will be contacted as soon as they are.

## 2019-02-20 ENCOUNTER — Other Ambulatory Visit: Payer: Self-pay | Admitting: Oncology

## 2019-02-20 NOTE — Progress Notes (Signed)
I called Margreta Journey again today.  I left her a message yesterday.  We are now being told that Dub Mikes is foundation one results and PDL 1 results will not be available until 02/27/2019 which is a week from today.  Meanwhile according to Margreta Journey her mother is declining.  Unfortunately I am not sure what cannot be able to get her hospice help while in a skilled nursing facility these days as many of those facilities are being closed.  I left my beeper number for Margreta Journey so she can be pin we can touch base.  We did increase the steroids yesterday (dexamethasone 4 mg twice daily) in case of Dub Mikes is developing brain mets or brain swelling.  The question is where to go from here.  Note that she does have an unknown facility DNR in place.

## 2019-02-20 NOTE — Progress Notes (Unsigned)
I was finally able to get a hold of Karen Kaiser.  She tells me that the number I have been calling is her cell number and when she is at her home she really does not have any cell phone coverage.  She does have a landline which is [phone] and I can reach her on that number when she is at home.  Half that time she is at [place] home.  She request we call at the home first either she or her sister should be there.  We discussed Karen Kaiser this soreness on the right side of her head, more like on the scalp than anything else.  Also the daughter feels Karen Kaiser his vision seems to be getting worse but apparently Karen Kaiser has had longstanding macular degeneration or other visual problems followed by her ophthalmologist in any case her peripheral vision and direct vision is very poor at this point.  Overall though the daughter does not suggest there has been a lot of change.  There continues to be some cough and production.  Karen Kaiser is getting physical therapy.  She is receiving help from hospice and palliative care.  The blood pressure has been adequate.  She is on several blood pressure medications but none of the numbers that I got from the daughter are low so I am not changing those.  We started [place] on some Decadron yesterday in case what we are dealing with his brain metastases.  I asked the daughter to call us back on Monday, 13 April to let us know if there has been any improvement and if it has not we will go back to prednisone 5 mg.  We should have the foundation 1 report by late next week.  At that time we will decide if we are going to treat or Karen Kaiser is going to go on to full hospice

## 2019-02-21 DIAGNOSIS — I10 Essential (primary) hypertension: Secondary | ICD-10-CM | POA: Diagnosis not present

## 2019-02-21 DIAGNOSIS — C50919 Malignant neoplasm of unspecified site of unspecified female breast: Secondary | ICD-10-CM | POA: Diagnosis not present

## 2019-02-21 DIAGNOSIS — J9601 Acute respiratory failure with hypoxia: Secondary | ICD-10-CM | POA: Diagnosis not present

## 2019-02-21 DIAGNOSIS — J910 Malignant pleural effusion: Secondary | ICD-10-CM | POA: Diagnosis not present

## 2019-02-21 DIAGNOSIS — C7800 Secondary malignant neoplasm of unspecified lung: Secondary | ICD-10-CM | POA: Diagnosis not present

## 2019-02-21 DIAGNOSIS — I700 Atherosclerosis of aorta: Secondary | ICD-10-CM | POA: Diagnosis not present

## 2019-02-24 ENCOUNTER — Telehealth: Payer: Self-pay | Admitting: *Deleted

## 2019-02-24 DIAGNOSIS — C384 Malignant neoplasm of pleura: Secondary | ICD-10-CM | POA: Diagnosis not present

## 2019-02-24 NOTE — Telephone Encounter (Signed)
This RN spoke with pt daughter, Obie Dredge who states Ciarah' nausea and appetite has improved since use of decadron.  Noted other concerns are :  Unable to voice words well- can think clearly no noted sensorium changes - " just not able to talk "  She is " strangling " when she swallows especially liquids.  Increased cough but they restarted tessalon perles with good benefit.  Margreta Journey also inquired about proceeding with a hospice referral due to increased changes " not sure if this is complicated because I understand that Dr Felipa Eth is currently her attending MD but I seem to be calling your office for more needs "  Above reviewed with MD who stated referral to hospice can be made at this time.

## 2019-02-25 ENCOUNTER — Telehealth: Payer: Self-pay | Admitting: *Deleted

## 2019-02-25 ENCOUNTER — Encounter (HOSPITAL_COMMUNITY): Payer: Self-pay | Admitting: Oncology

## 2019-02-25 DIAGNOSIS — C7800 Secondary malignant neoplasm of unspecified lung: Secondary | ICD-10-CM | POA: Diagnosis not present

## 2019-02-25 DIAGNOSIS — I700 Atherosclerosis of aorta: Secondary | ICD-10-CM | POA: Diagnosis not present

## 2019-02-25 DIAGNOSIS — J9601 Acute respiratory failure with hypoxia: Secondary | ICD-10-CM | POA: Diagnosis not present

## 2019-02-25 DIAGNOSIS — C50919 Malignant neoplasm of unspecified site of unspecified female breast: Secondary | ICD-10-CM | POA: Diagnosis not present

## 2019-02-25 DIAGNOSIS — I10 Essential (primary) hypertension: Secondary | ICD-10-CM | POA: Diagnosis not present

## 2019-02-25 DIAGNOSIS — J910 Malignant pleural effusion: Secondary | ICD-10-CM | POA: Diagnosis not present

## 2019-02-25 NOTE — Telephone Encounter (Signed)
Per MD review- request for home hospice to be placed with Dr Jannifer Rodney as attending.  This RN called above to daughter who was with pt who verbalized agreement.  [facility] ( hospice ) contacted with above request.

## 2019-02-26 ENCOUNTER — Telehealth: Payer: Self-pay

## 2019-02-26 ENCOUNTER — Telehealth: Payer: Self-pay | Admitting: Pharmacist

## 2019-02-26 ENCOUNTER — Other Ambulatory Visit: Payer: Self-pay | Admitting: Oncology

## 2019-02-26 MED ORDER — CRIZOTINIB 200 MG PO CAPS: 200.0000 mg | ORAL_CAPSULE | Freq: Two times a day (BID) | ORAL | 6 refills | Status: AC

## 2019-02-26 NOTE — Telephone Encounter (Signed)
Oral Oncology Patient Advocate Encounter  Received notification from [employer] that prior authorization for Karen Kaiser is required.  PA submitted on CoverMyMeds Key [id number] Status is pending  Oral Oncology Clinic will continue to follow.  [name] Patient [place] Phone [phone] Fax [phone] 02/26/2019    2:38 PM

## 2019-02-26 NOTE — Telephone Encounter (Signed)
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  Patient with remote history of DCIS breast carcinoma with diagnosis and resection in 1998  Original diagnosis of stage I lung cancer in 2004, s/p resection at that time Recurrence with malignant pleural effusion was noted in March 2020 Molecular studies show patient is positive for ALK-rearragment non small cell lung cancer and patient is now under evaluation to initiate targeted treatment with Xalkori at slightly reduced dose of 224m BID  Initiation of Alecensa discussed with MD, XHulda Humphreyis preferred in this patient due to Alecensa pill burden (4 capsules BID) and noted swallowing difficulty of patient at the moment.  Labs from 02/06/19 assessed, OK for treatment initiation. 02/03/19 SCr=0.67, round to 1.0 for age > 639 est CrCl ~ 40 mL/min No dose adjustment per manufacturer with CrCl > 30 mL/min HRs reviewed, minimal recent readings, bradycardia is possible with Xalkori treatment, will continue to be monitored  Current medication list in Epic reviewed, DDIs with XHulda Humphreyidentified:  Xalkori and colchicine: category D interaction: XHulda Humphreyis a moderate inhibitor of CYP3A4 possibly leading to decreased metabolism and increased systemic exposure to colchicine. Colchicine is dosed every other day. Will confirm continued administration of colchicine during initial counseling and possibility of discontinuation.  Crizotinib and bisoprolol: category C interaction: both agents with bradycardia-causing potential. This will be discussed with patient and caregiver during initial counseling.  Xalkori and amlodipine: category C interaction: XHulda Humphreyis a moderate inhibitor of CYP3A4 possibly leading to decreased metabolism and increased systemic exposure to  amlodipine. Will discuss BP monitoring with patient and caregiver.  [facility].  Oral Oncology Clinic will continue to follow for insurance authorization, copayment issues, initial counseling and start date.  JJohny Drilling PharmD, BCPS, BCOP  02/26/2019 12:16 PM Oral Oncology Clinic [phone]

## 2019-02-26 NOTE — Progress Notes (Signed)
We finally got the foundation 1 report on Karen Kaiser and it shows that she does have an ALK mutation in the tumor.  This makes her a candidate for crizotinib.  We had just placed a hospice referral yesterday because we thought it would take another 2 weeks for the foundation 1 to come in, but we are calling about off.  She will continue with in-home palliative care.  We will ask our pharmacy oral chemotherapy specialists to obtain this for her--I have placed the order through [facility] pharmacy--and of course they will also discuss possible side effects toxicities and complications further with the family, daughter Karen Kaiser in particular being the one who is most aware of the overall situation (the patient's husband Karen Kaiser has significant dementia).  I am hopeful Karen Kaiser will be able to tolerate this well and that after 1 to 2 months we can document significant improvement.

## 2019-02-26 NOTE — Telephone Encounter (Signed)
Oral Oncology Patient Advocate Encounter  Prior Authorization for Karen Kaiser has been approved.    PA# [id number] Effective dates: 02/26/19 through 11/20/19  Oral Oncology Clinic will continue to follow.   [name] Phone [phone] Fax [phone] 02/26/2019    2:48 PM

## 2019-02-26 NOTE — Telephone Encounter (Signed)
Nurse spoke with Delsa Sale at [facility] and notified of MD's recommendations to discontinue hospice order, in home palliative care to continue.

## 2019-02-27 ENCOUNTER — Other Ambulatory Visit: Payer: Self-pay | Admitting: Oncology

## 2019-02-27 NOTE — Telephone Encounter (Signed)
Thank you Karen Kaiser-- don't mail the pills yet-- patient is trying to decide if she just wants hospice--this per daughter with whom I spoke today--I will call patient tomorrow AM (Friday 04/10) and I will get back to you immediately after speaking with her  Thank you

## 2019-02-27 NOTE — Progress Notes (Unsigned)
I called Karen Kaiser of the patient's daughter [phone] and discussed extensively the possibility of using crizotinib.  The daughter had many questions regarding quality of life length of life cost side effects and what I would recommend if Karen Kaiser were my mom.  After long discussion we were able to clarify those questions and we elected that I will call Karen Kaiser tomorrow morning and she will tell me whether or not she would like to try those pills and if she does not then we will make sure she has a full hospice referral placed.  If she does we will follow her closely by phone over the next few weeks to make sure she tolerates the treatment well.

## 2019-02-28 ENCOUNTER — Telehealth: Payer: Self-pay | Admitting: Oncology

## 2019-02-28 ENCOUNTER — Other Ambulatory Visit: Payer: Self-pay | Admitting: Oncology

## 2019-02-28 NOTE — Telephone Encounter (Signed)
Oral Oncology Pharmacist Encounter  Received notification from MD that patient has decided against initiating treatment and requests full Hospice care. No further needs from [facility] identified at this time. Oral Oncology Clinic will sign off. Please let us know if we can be of assistance in the future.  Johny Drilling, PharmD, BCPS, BCOP  02/28/2019 1:45 PM Oral Oncology Clinic [phone]

## 2019-02-28 NOTE — Progress Notes (Signed)
I spoke with Karen Kaiser and her daughter Karen Kaiser.  Karen Kaiser was very hoarse and speaking is difficult for her.  She thinks she has injured her vocal cords from all the coughing.  She is extremely debilitated.  She understands that we have got crizotinib approved for her.  We reviewed some of the possible side effects toxicities and complications of this agent.  Karen Kaiser has decided that she really does not want to give this kinase inhibitor a try.  She already has "no quality of life".  She does not want to prolong this period of her life.  She tells me she is ready for full hospice.  I discussed this with the daughter and the family of course is respectful of [name]'s wishes  Accordingly I have let our oral chemotherapy's pharmacists know to cancel the crizotinib.  Hospice will be coming on Wednesday of next week or earlier if needed to the family.  They are ready being served by in-home palliative care  I will give Karen Kaiser a call in about 2 weeks just to make sure there is nothing else we need to do for her.

## 2019-02-28 NOTE — Telephone Encounter (Signed)
Cancelled all appointments per 4/10 schedule message. Spoke with dtr.

## 2019-03-03 ENCOUNTER — Telehealth: Payer: Self-pay | Admitting: *Deleted

## 2019-03-03 DIAGNOSIS — Z9011 Acquired absence of right breast and nipple: Secondary | ICD-10-CM | POA: Diagnosis not present

## 2019-03-03 DIAGNOSIS — I10 Essential (primary) hypertension: Secondary | ICD-10-CM | POA: Diagnosis not present

## 2019-03-03 DIAGNOSIS — E119 Type 2 diabetes mellitus without complications: Secondary | ICD-10-CM | POA: Diagnosis not present

## 2019-03-03 DIAGNOSIS — Z853 Personal history of malignant neoplasm of breast: Secondary | ICD-10-CM | POA: Diagnosis not present

## 2019-03-03 DIAGNOSIS — H409 Unspecified glaucoma: Secondary | ICD-10-CM | POA: Diagnosis not present

## 2019-03-03 DIAGNOSIS — C3431 Malignant neoplasm of lower lobe, right bronchus or lung: Secondary | ICD-10-CM | POA: Diagnosis not present

## 2019-03-03 DIAGNOSIS — K219 Gastro-esophageal reflux disease without esophagitis: Secondary | ICD-10-CM | POA: Diagnosis not present

## 2019-03-03 DIAGNOSIS — J90 Pleural effusion, not elsewhere classified: Secondary | ICD-10-CM | POA: Diagnosis not present

## 2019-03-03 DIAGNOSIS — C7802 Secondary malignant neoplasm of left lung: Secondary | ICD-10-CM | POA: Diagnosis not present

## 2019-03-03 DIAGNOSIS — C7931 Secondary malignant neoplasm of brain: Secondary | ICD-10-CM | POA: Diagnosis not present

## 2019-03-03 LAB — FUNGUS CULTURE WITH STAIN

## 2019-03-03 LAB — FUNGAL ORGANISM REFLEX

## 2019-03-03 LAB — FUNGUS CULTURE RESULT

## 2019-03-03 NOTE — Telephone Encounter (Signed)
MD requested follow up on hospice referral per call on 4/10 with pt and daughter.  This RN contacted Museum/gallery conservator at [facility] per above. Amber states they contacted daughter last Wednesday per referral with request to hold off at that time due to " they wanted to wait until the path result returned in case she decided on treatment ".  This RN discussed - call on Friday with MD- and need to contact for initiation of services ASAP.  Amber will contact the pt's daughter Joseph Art for care.

## 2019-03-04 ENCOUNTER — Ambulatory Visit: Payer: Medicare Other

## 2019-03-06 NOTE — Addendum Note (Signed)
Addended by: Valerie Salts on: 03/06/2019 02:39 PM   Modules accepted: Orders

## 2019-03-10 ENCOUNTER — Ambulatory Visit: Payer: Medicare Other | Admitting: Pulmonary Disease

## 2019-03-11 ENCOUNTER — Other Ambulatory Visit: Payer: Self-pay | Admitting: *Deleted

## 2019-03-11 MED ORDER — FLUCONAZOLE 100 MG PO TABS: 100.0000 mg | ORAL_TABLET | Freq: Every day | ORAL | 3 refills | Status: AC

## 2019-03-13 ENCOUNTER — Telehealth: Payer: Self-pay | Admitting: Licensed Clinical Social Worker

## 2019-03-13 ENCOUNTER — Other Ambulatory Visit: Payer: Self-pay

## 2019-03-13 ENCOUNTER — Other Ambulatory Visit: Payer: Medicare Other | Admitting: Licensed Clinical Social Worker

## 2019-03-13 DIAGNOSIS — Z515 Encounter for palliative care: Secondary | ICD-10-CM

## 2019-03-13 NOTE — Progress Notes (Signed)
COMMUNITY PALLIATIVE CARE SW NOTE  PATIENT NAME: Karen Kaiser DOB: Apr 01, 1935 MRN: [id number]  PRIMARY CARE PROVIDER: Lajean Manes, MD  RESPONSIBLE PARTY:  Acct ID - Guarantor Home Phone Work Phone Relationship Acct Type  [id number] Antony [id number]  Self P/F     [address], [place], [place] [address]   Due to the COVID-19 crisis, this virtual check-in visit was done via telephone from my office and it was initiated and consent given by thispatient and or family.  PLAN OF CARE and INTERVENTIONS:             1. GOALS OF CARE/ ADVANCE CARE PLANNING:  For patient to remain at home.  Patient has a DNR. 2. SOCIAL/EMOTIONAL/SPIRITUAL ASSESSMENT/ INTERVENTIONS:  SW and Palliative Care RN, Daryl Eastern, conducted a previously scheduled virtual check-in visit with patient's daughter, Karen Kaiser.  She reports patient has displayed significant decline and Hospice is not involved.  Patient is sleeping more, but her appetite is stable.  Patient's husband, Karen Kaiser, has cognitive impairment, which will require frequent reminders of patient's condition. 3. PATIENT/CAREGIVER EDUCATION/ COPING:  Daughter, Karen Kaiser, expresses her feelings openly. 4. COMMUNITY RESOURCES COORDINATION/ HEALTH CARE NAVIGATION: Trainer is now caring for patient. 5. FINANCIAL/LEGAL CONCERNS/INTERVENTIONS:  None.     SOCIAL HX:  Social History   Tobacco Use  . Smoking status: Never Smoker  . Smokeless tobacco: Never Used  Substance Use Topics  . Alcohol use: No    CODE STATUS:  DNR  ADVANCED DIRECTIVES: N MOST FORM COMPLETE:  N HOSPICE EDUCATION PROVIDED: N       Lucrezia Dehne Z Ashawn Rinehart, LCSW

## 2019-03-14 ENCOUNTER — Encounter (HOSPITAL_COMMUNITY): Payer: Medicare Other

## 2019-03-14 ENCOUNTER — Other Ambulatory Visit: Payer: Self-pay | Admitting: Oncology

## 2019-03-14 LAB — ACID FAST CULTURE WITH REFLEXED SENSITIVITIES (MYCOBACTERIA): Acid Fast Culture: NEGATIVE

## 2019-03-21 DIAGNOSIS — C3431 Malignant neoplasm of lower lobe, right bronchus or lung: Secondary | ICD-10-CM | POA: Diagnosis not present

## 2019-03-21 DIAGNOSIS — I10 Essential (primary) hypertension: Secondary | ICD-10-CM | POA: Diagnosis not present

## 2019-03-21 DIAGNOSIS — C7802 Secondary malignant neoplasm of left lung: Secondary | ICD-10-CM | POA: Diagnosis not present

## 2019-03-21 DIAGNOSIS — Z853 Personal history of malignant neoplasm of breast: Secondary | ICD-10-CM | POA: Diagnosis not present

## 2019-03-21 DIAGNOSIS — C7931 Secondary malignant neoplasm of brain: Secondary | ICD-10-CM | POA: Diagnosis not present

## 2019-03-21 DIAGNOSIS — Z9011 Acquired absence of right breast and nipple: Secondary | ICD-10-CM | POA: Diagnosis not present

## 2019-03-21 DIAGNOSIS — E119 Type 2 diabetes mellitus without complications: Secondary | ICD-10-CM | POA: Diagnosis not present

## 2019-03-21 DIAGNOSIS — K219 Gastro-esophageal reflux disease without esophagitis: Secondary | ICD-10-CM | POA: Diagnosis not present

## 2019-03-21 DIAGNOSIS — J90 Pleural effusion, not elsewhere classified: Secondary | ICD-10-CM | POA: Diagnosis not present

## 2019-03-21 DIAGNOSIS — H409 Unspecified glaucoma: Secondary | ICD-10-CM | POA: Diagnosis not present

## 2019-04-01 DIAGNOSIS — C3431 Malignant neoplasm of lower lobe, right bronchus or lung: Secondary | ICD-10-CM | POA: Diagnosis not present

## 2019-04-01 DIAGNOSIS — I10 Essential (primary) hypertension: Secondary | ICD-10-CM | POA: Diagnosis not present

## 2019-04-01 DIAGNOSIS — C7931 Secondary malignant neoplasm of brain: Secondary | ICD-10-CM | POA: Diagnosis not present

## 2019-04-01 DIAGNOSIS — E119 Type 2 diabetes mellitus without complications: Secondary | ICD-10-CM | POA: Diagnosis not present

## 2019-04-01 DIAGNOSIS — C7802 Secondary malignant neoplasm of left lung: Secondary | ICD-10-CM | POA: Diagnosis not present

## 2019-04-01 DIAGNOSIS — J90 Pleural effusion, not elsewhere classified: Secondary | ICD-10-CM | POA: Diagnosis not present

## 2019-04-07 DIAGNOSIS — J90 Pleural effusion, not elsewhere classified: Secondary | ICD-10-CM | POA: Diagnosis not present

## 2019-04-07 DIAGNOSIS — E119 Type 2 diabetes mellitus without complications: Secondary | ICD-10-CM | POA: Diagnosis not present

## 2019-04-07 DIAGNOSIS — C7802 Secondary malignant neoplasm of left lung: Secondary | ICD-10-CM | POA: Diagnosis not present

## 2019-04-07 DIAGNOSIS — C7931 Secondary malignant neoplasm of brain: Secondary | ICD-10-CM | POA: Diagnosis not present

## 2019-04-07 DIAGNOSIS — I10 Essential (primary) hypertension: Secondary | ICD-10-CM | POA: Diagnosis not present

## 2019-04-07 DIAGNOSIS — C3431 Malignant neoplasm of lower lobe, right bronchus or lung: Secondary | ICD-10-CM | POA: Diagnosis not present

## 2019-04-08 DIAGNOSIS — E119 Type 2 diabetes mellitus without complications: Secondary | ICD-10-CM | POA: Diagnosis not present

## 2019-04-08 DIAGNOSIS — I10 Essential (primary) hypertension: Secondary | ICD-10-CM | POA: Diagnosis not present

## 2019-04-08 DIAGNOSIS — C3431 Malignant neoplasm of lower lobe, right bronchus or lung: Secondary | ICD-10-CM | POA: Diagnosis not present

## 2019-04-08 DIAGNOSIS — C7802 Secondary malignant neoplasm of left lung: Secondary | ICD-10-CM | POA: Diagnosis not present

## 2019-04-08 DIAGNOSIS — J90 Pleural effusion, not elsewhere classified: Secondary | ICD-10-CM | POA: Diagnosis not present

## 2019-04-08 DIAGNOSIS — C7931 Secondary malignant neoplasm of brain: Secondary | ICD-10-CM | POA: Diagnosis not present

## 2019-04-09 DIAGNOSIS — J90 Pleural effusion, not elsewhere classified: Secondary | ICD-10-CM | POA: Diagnosis not present

## 2019-04-09 DIAGNOSIS — I10 Essential (primary) hypertension: Secondary | ICD-10-CM | POA: Diagnosis not present

## 2019-04-09 DIAGNOSIS — C3431 Malignant neoplasm of lower lobe, right bronchus or lung: Secondary | ICD-10-CM | POA: Diagnosis not present

## 2019-04-09 DIAGNOSIS — C7802 Secondary malignant neoplasm of left lung: Secondary | ICD-10-CM | POA: Diagnosis not present

## 2019-04-09 DIAGNOSIS — E119 Type 2 diabetes mellitus without complications: Secondary | ICD-10-CM | POA: Diagnosis not present

## 2019-04-09 DIAGNOSIS — C7931 Secondary malignant neoplasm of brain: Secondary | ICD-10-CM | POA: Diagnosis not present

## 2019-04-10 DIAGNOSIS — E119 Type 2 diabetes mellitus without complications: Secondary | ICD-10-CM | POA: Diagnosis not present

## 2019-04-10 DIAGNOSIS — C3431 Malignant neoplasm of lower lobe, right bronchus or lung: Secondary | ICD-10-CM | POA: Diagnosis not present

## 2019-04-10 DIAGNOSIS — C7931 Secondary malignant neoplasm of brain: Secondary | ICD-10-CM | POA: Diagnosis not present

## 2019-04-10 DIAGNOSIS — I10 Essential (primary) hypertension: Secondary | ICD-10-CM | POA: Diagnosis not present

## 2019-04-10 DIAGNOSIS — J90 Pleural effusion, not elsewhere classified: Secondary | ICD-10-CM | POA: Diagnosis not present

## 2019-04-10 DIAGNOSIS — C7802 Secondary malignant neoplasm of left lung: Secondary | ICD-10-CM | POA: Diagnosis not present

## 2019-04-10 NOTE — Progress Notes (Signed)
Karen Kaiser with [employer] called this morning to report that Karen Kaiser passed away this morning at 0814. Dr. Jana Hakim aware and he will contact the daughter he stated and we will wait for the death certificate.

## 2019-04-21 ENCOUNTER — Other Ambulatory Visit: Payer: Self-pay | Admitting: Oncology

## 2019-04-21 ENCOUNTER — Ambulatory Visit: Payer: Medicare Other | Admitting: Acute Care

## 2019-04-21 DEATH — deceased

## 2019-04-29 ENCOUNTER — Other Ambulatory Visit: Payer: Medicare Other

## 2019-05-06 ENCOUNTER — Ambulatory Visit: Payer: Medicare Other | Admitting: Oncology

## 2019-05-14 NOTE — Telephone Encounter (Signed)
na
